# Patient Record
Sex: Male | Born: 1935
Health system: Southern US, Community
[De-identification: ages and names within clinical notes are randomized; demographics above are authoritative.]

## PROBLEM LIST (undated history)

## (undated) DIAGNOSIS — Z973 Presence of spectacles and contact lenses: Secondary | ICD-10-CM

## (undated) DIAGNOSIS — M1990 Unspecified osteoarthritis, unspecified site: Secondary | ICD-10-CM

## (undated) DIAGNOSIS — K219 Gastro-esophageal reflux disease without esophagitis: Secondary | ICD-10-CM

## (undated) DIAGNOSIS — I10 Essential (primary) hypertension: Secondary | ICD-10-CM

## (undated) DIAGNOSIS — E7800 Pure hypercholesterolemia, unspecified: Secondary | ICD-10-CM

## (undated) DIAGNOSIS — D649 Anemia, unspecified: Secondary | ICD-10-CM

## (undated) DIAGNOSIS — Z972 Presence of dental prosthetic device (complete) (partial): Secondary | ICD-10-CM

## (undated) DIAGNOSIS — E119 Type 2 diabetes mellitus without complications: Secondary | ICD-10-CM

## (undated) HISTORY — PX: COLONOSCOPY W/ BIOPSIES AND POLYPECTOMY: SHX1376

## (undated) HISTORY — PX: KNEE ARTHROSCOPY: SHX127

## (undated) HISTORY — PX: TONSILLECTOMY: SUR1361

## (undated) HISTORY — PX: COLONOSCOPY: SHX174

## (undated) HISTORY — PX: EYE SURGERY: SHX253

---

## 1999-12-01 ENCOUNTER — Ambulatory Visit (HOSPITAL_COMMUNITY): Admission: RE | Admit: 1999-12-01 | Discharge: 1999-12-01 | Payer: Self-pay | Admitting: Specialist

## 2000-01-07 ENCOUNTER — Ambulatory Visit (HOSPITAL_COMMUNITY): Admission: RE | Admit: 2000-01-07 | Discharge: 2000-01-07 | Payer: Self-pay | Admitting: Specialist

## 2000-03-05 ENCOUNTER — Encounter: Admission: RE | Admit: 2000-03-05 | Discharge: 2000-03-05 | Payer: Self-pay | Admitting: *Deleted

## 2000-03-05 ENCOUNTER — Encounter: Payer: Self-pay | Admitting: *Deleted

## 2009-05-14 ENCOUNTER — Encounter: Payer: Self-pay | Admitting: Emergency Medicine

## 2009-05-14 ENCOUNTER — Inpatient Hospital Stay (HOSPITAL_COMMUNITY): Admission: EM | Admit: 2009-05-14 | Discharge: 2009-05-15 | Payer: Self-pay | Admitting: Internal Medicine

## 2009-05-14 ENCOUNTER — Ambulatory Visit: Payer: Self-pay | Admitting: Diagnostic Radiology

## 2009-11-07 ENCOUNTER — Encounter: Admission: RE | Admit: 2009-11-07 | Discharge: 2009-11-07 | Payer: Self-pay | Admitting: Internal Medicine

## 2010-06-29 IMAGING — CR DG CHEST 1V PORT
1 series · 1 of 1 positions shown · non-contrast
Comparison: None

CLINICAL DATA: Left-sided chest pain

PORTABLE CHEST - 1 VIEW

[view not recorded]
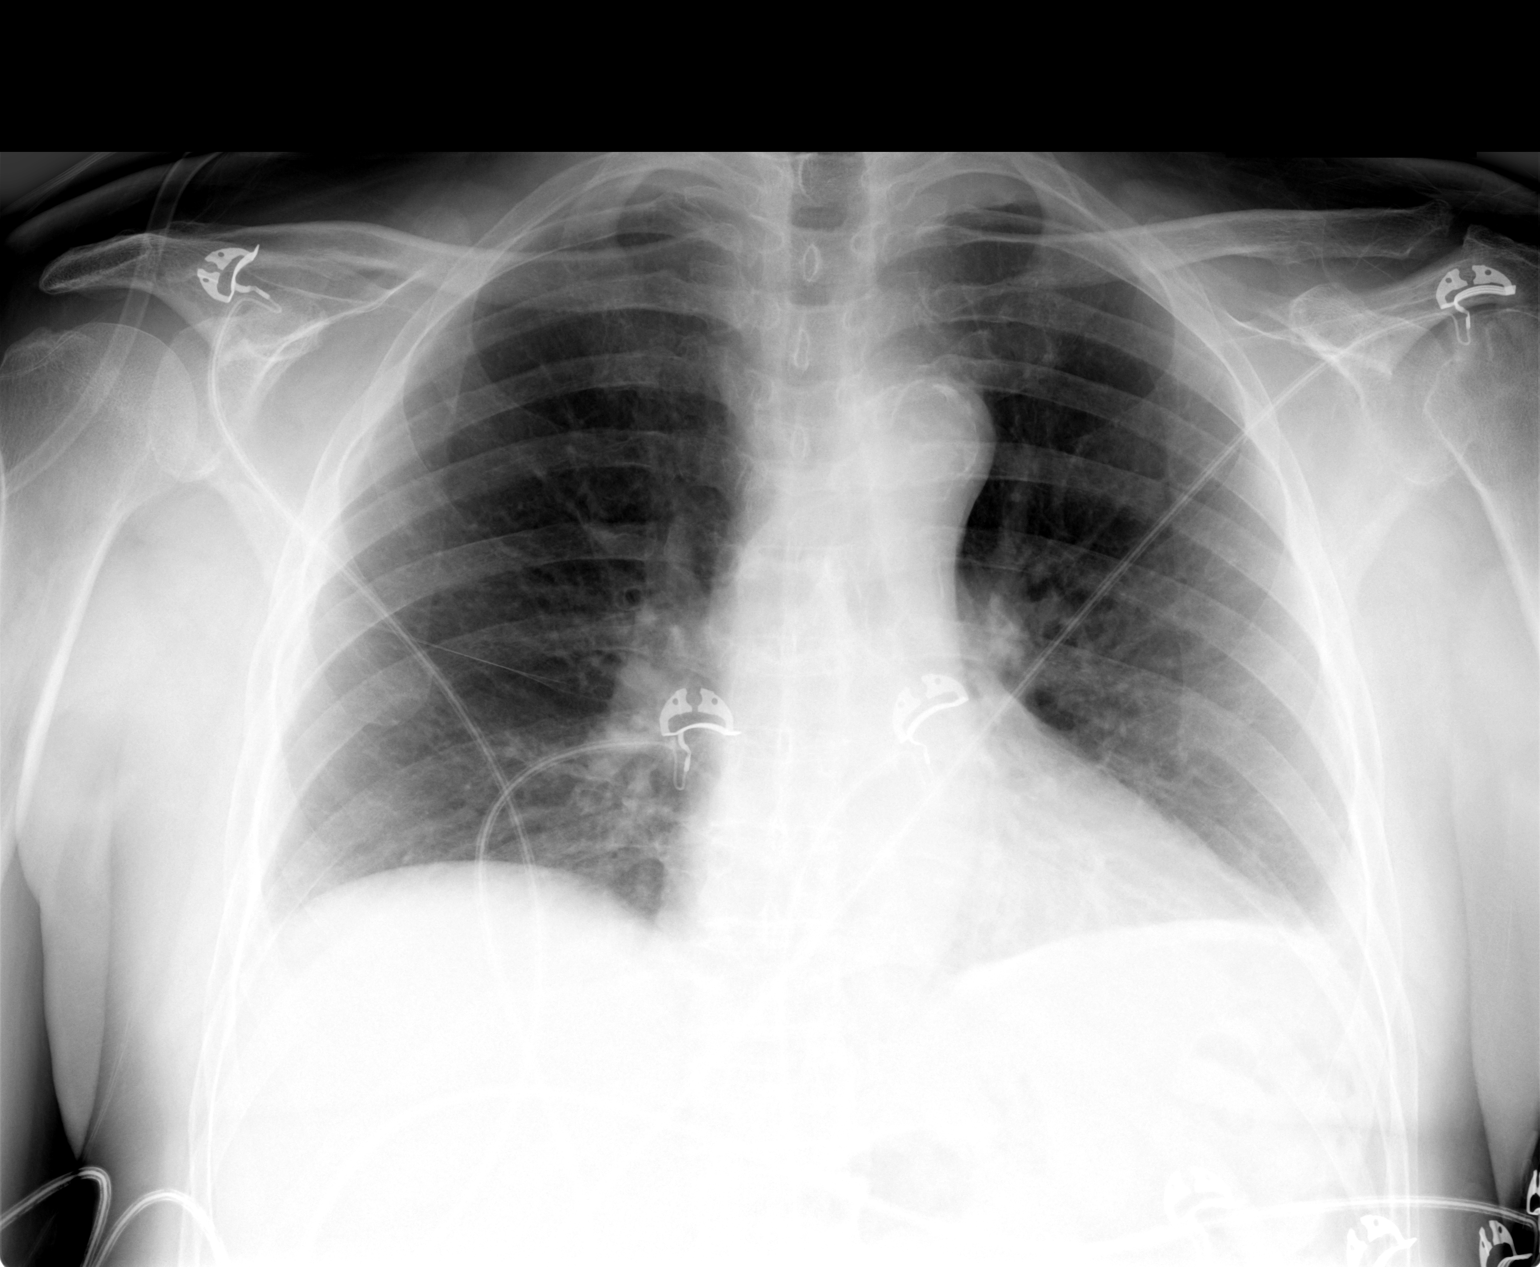

[1 of 1 positions shown; findings below may reference images not displayed]

FINDINGS: Artifact overlies chest.  Heart size is normal.  There is
atherosclerosis of the aorta.  The lungs are clear.  The
vascularity is normal.  There is a poor inspiration.  No
significant bony finding.
IMPRESSION: Poor inspiration.  No active disease.

## 2011-02-15 LAB — CBC
HCT: 39.3 % (ref 39.0–52.0)
Hemoglobin: 13.4 g/dL (ref 13.0–17.0)
MCHC: 34.2 g/dL (ref 30.0–36.0)
MCV: 91 fL (ref 78.0–100.0)
Platelets: 141 10*3/uL — ABNORMAL LOW (ref 150–400)
RBC: 4.32 MIL/uL (ref 4.22–5.81)
RDW: 13.1 % (ref 11.5–15.5)
WBC: 6.5 10*3/uL (ref 4.0–10.5)

## 2011-02-15 LAB — COMPREHENSIVE METABOLIC PANEL
ALT: 12 U/L (ref 0–53)
AST: 21 U/L (ref 0–37)
Albumin: 4 g/dL (ref 3.5–5.2)
Alkaline Phosphatase: 59 U/L (ref 39–117)
BUN: 25 mg/dL — ABNORMAL HIGH (ref 6–23)
CO2: 27 mEq/L (ref 19–32)
Calcium: 9.1 mg/dL (ref 8.4–10.5)
Chloride: 103 mEq/L (ref 96–112)
Creatinine, Ser: 1 mg/dL (ref 0.4–1.5)
GFR calc Af Amer: >60 mL/min (ref >60)
GFR calc non Af Amer: >60 mL/min (ref >60)
Glucose, Bld: 192 mg/dL — ABNORMAL HIGH (ref 70–99)
Potassium: 4.4 mEq/L (ref 3.5–5.1)
Sodium: 137 mEq/L (ref 135–145)
Total Bilirubin: 0.2 mg/dL — ABNORMAL LOW (ref 0.3–1.2)
Total Protein: 6.8 g/dL (ref 6.0–8.3)

## 2011-02-15 LAB — DIFFERENTIAL
Basophils Absolute: 0.1 10*3/uL (ref 0.0–0.1)
Basophils Relative: 1 % (ref 0–1)
Eosinophils Absolute: 0.2 10*3/uL (ref 0.0–0.7)
Eosinophils Relative: 2 % (ref 0–5)
Neutrophils Relative %: 61 % (ref 43–77)

## 2011-02-15 LAB — PROTIME-INR: Prothrombin Time: 14 seconds (ref 11.6–15.2)

## 2011-02-15 LAB — POCT CARDIAC MARKERS: Troponin i, poc: <0.05 ng/mL (ref 0.00–0.09)

## 2011-02-16 LAB — BASIC METABOLIC PANEL
BUN: 22 mg/dL (ref 6–23)
CO2: 26 mEq/L (ref 19–32)
Calcium: 8.7 mg/dL (ref 8.4–10.5)
Chloride: 103 mEq/L (ref 96–112)
Creatinine, Ser: 0.95 mg/dL (ref 0.4–1.5)
GFR calc Af Amer: >60 mL/min (ref >60)
GFR calc non Af Amer: >60 mL/min (ref >60)
Glucose, Bld: 152 mg/dL — ABNORMAL HIGH (ref 70–99)
Potassium: 4.2 mEq/L (ref 3.5–5.1)
Sodium: 135 mEq/L (ref 135–145)

## 2011-02-16 LAB — CARDIAC PANEL(CRET KIN+CKTOT+MB+TROPI)
CK, MB: 1.6 ng/mL (ref 0.3–4.0)
CK, MB: 1.6 ng/mL (ref 0.3–4.0)
Relative Index: INVALID (ref 0.0–2.5)
Relative Index: INVALID (ref 0.0–2.5)
Total CK: 89 U/L (ref 7–232)
Total CK: 99 U/L (ref 7–232)
Troponin I: 0.01 ng/mL (ref 0.00–0.06)
Troponin I: 0.01 ng/mL (ref 0.00–0.06)

## 2011-02-16 LAB — DIFFERENTIAL
Basophils Absolute: 0.1 10*3/uL (ref 0.0–0.1)
Basophils Relative: 1 % (ref 0–1)
Eosinophils Absolute: 0.2 10*3/uL (ref 0.0–0.7)
Eosinophils Relative: 4 % (ref 0–5)
Lymphocytes Relative: 39 % (ref 12–46)
Lymphs Abs: 2.1 10*3/uL (ref 0.7–4.0)
Monocytes Absolute: 0.5 10*3/uL (ref 0.1–1.0)
Monocytes Relative: 9 % (ref 3–12)
Neutro Abs: 2.6 10*3/uL (ref 1.7–7.7)
Neutrophils Relative %: 48 % (ref 43–77)

## 2011-02-16 LAB — CBC
HCT: 39 % (ref 39.0–52.0)
HCT: 40.8 % (ref 39.0–52.0)
Hemoglobin: 13.3 g/dL (ref 13.0–17.0)
Hemoglobin: 14.3 g/dL (ref 13.0–17.0)
MCHC: 34.2 g/dL (ref 30.0–36.0)
MCHC: 34.9 g/dL (ref 30.0–36.0)
MCV: 90.8 fL (ref 78.0–100.0)
MCV: 90.8 fL (ref 78.0–100.0)
Platelets: 130 10*3/uL — ABNORMAL LOW (ref 150–400)
Platelets: 141 10*3/uL — ABNORMAL LOW (ref 150–400)
RBC: 4.29 MIL/uL (ref 4.22–5.81)
RBC: 4.5 MIL/uL (ref 4.22–5.81)
RDW: 13.9 % (ref 11.5–15.5)
RDW: 14.1 % (ref 11.5–15.5)
WBC: 5.5 10*3/uL (ref 4.0–10.5)
WBC: 5.9 10*3/uL (ref 4.0–10.5)

## 2011-02-16 LAB — GLUCOSE, CAPILLARY
Glucose-Capillary: 140 mg/dL — ABNORMAL HIGH (ref 70–99)
Glucose-Capillary: 174 mg/dL — ABNORMAL HIGH (ref 70–99)

## 2011-02-16 LAB — LIPID PANEL
Cholesterol: 125 mg/dL (ref 0–200)
HDL: 30 mg/dL — ABNORMAL LOW (ref >39)
LDL Cholesterol: 57 mg/dL (ref 0–99)
Total CHOL/HDL Ratio: 4.2 RATIO
Triglycerides: 188 mg/dL — ABNORMAL HIGH (ref <150)
VLDL: 38 mg/dL (ref 0–40)

## 2011-02-16 LAB — PHOSPHORUS: Phosphorus: 4 mg/dL (ref 2.3–4.6)

## 2011-02-16 LAB — MAGNESIUM: Magnesium: 2.2 mg/dL (ref 1.5–2.5)

## 2011-03-24 NOTE — H&P (Signed)
NAME:  Andre, Mills NO.:  [id number]   MEDICAL RECORD NO.:  [id number]          PATIENT TYPE:  INP   LOCATION:  3705                         FACILITY:  [facility]   PHYSICIAN:  Andre Mills, M.D.DATE OF BIRTH:  1925/01/15   DATE OF ADMISSION:  05/14/2009  DATE OF DISCHARGE:                              HISTORY & PHYSICAL   PRIMARY CARE PHYSICIAN:  Andre Mills, M.D.   CHIEF COMPLAINT:  Chest pain.   HISTORY OF PRESENT ILLNESS:  Patient is an 77 year old Caucasian male  who initially went to [facility] for chest pain and was  later asked to be transferred here for further evaluation of the chest  pain.  Andre Mills reported that he experienced the sharp chest pain when  waking up from sleep this morning.  The pain lasted for about a second.  It was 8/10 in intensity with no radiation.  The pain was sharp in  nature.  He had multiple episodes of this sharp chest pain, which lasted  for less than a second.  He reports no exacerbating factors or  alleviating factors.  He denies having any shortness of breath,  palpitations, or lightheadedness associated with this chest pain.  He  had no similar episodes in the past.  He never had a stress test done.  He denies having any cough or sputum production.  There is no dyspnea on  exertion.  He denies any PND or orthopnea.  He denies having any flu-  like symptoms in the past 2 weeks.  There is no long distance travel in  the last month.  The pain is not pruritic.  It does not change in  intensity with position.  The pain is not reproducible with palpation.   Andre Mills reported that he had a cardiac catheterization done several  years ago, but no records are available to confirm this.   REVIEW OF SYSTEMS:  The complete review of systems was done, which  included general, HEENT, cardiovascular, respiratory, GI, GU, endocrine,  skin, musculoskeletal, neurologic, psychiatric, all within normal limits  other than what is mentioned in the history of present illness.   PAST MEDICAL HISTORY:  1. Diabetes mellitus type 2.  2. Hypertension.  3. Gastroesophageal reflux disease.  4. Hyperlipidemia.   ALLERGIES:  1. MACRODANTIN.  2. LIMBITROL.  3. SULFA DRUGS.  4. FLEXERIL.  5. NAPROSYN.   CURRENT MEDICATIONS AT HOME:  1. Actos 45 mg once daily.  2. Glimepiride 2 mg 1-1/2 tablets once daily.  3. Prevacid once daily.  4. Lisinopril/HCTZ 20/25 mg once daily.  5. Simvastatin 80 mg p.o. q.h.s.  6. Aspirin 81 mg once daily.   SOCIAL HISTORY:  There is no history of tobacco, alcohol, or illicit  drug use.   FAMILY HISTORY:  Noncontributory.   PHYSICAL EXAMINATION:  At the time of presentation at the outside  hospital, blood pressure was 185/61, pulse rate 72, respirations 18,  temperature 98.3.  His O2 sats were 100% on room air.  CURRENT VITALS:  Temperature 98.4, pulse rate 60, respirations 19, blood  pressure 162/74.  O2 sats 96% on room air.  GENERAL APPEARANCE:  Not in any acute distress.  Awake, alert and  oriented x3.  Afebrile.  HEENT:  Normocephalic and atraumatic.  Pupils are equal and reactive to  light and accommodation.  Extraocular muscles are intact.  Mucous  membranes are moist.  NECK:  Supple.  No JVD, lymphadenopathy, or carotid bruits.  CVS:  Regular rate and rhythm normal.  No murmurs, rubs or gallops.  LUNGS:  Clear to auscultation bilaterally.  ABDOMEN:  Soft, nontender, nondistended.  No hepatosplenomegaly or  palpable masses.  EXTREMITIES:  No clubbing, cyanosis or edema.  NEUROLOGIC:  Grossly nonfocal.   LABS/STUDIES OBTAINED AT OUTSIDE HOSPITAL:  CK 59.  CK-MB less than 1.  Troponin less than 0.05.  D-dimer less than 0.22.  Comprehensive  metabolic panel:  Sodium 137, potassium 4.4, chloride 103, bicarb 27,  BUN 25, creatinine 1, blood glucose 192.  Total protein 6.8.  Albumin 4.  ALT 12, AST 21, alkaline phosphatase 59, total bilirubin 0.2.  PTT 34   seconds.  INR 1.1.  PT 14.   Portable chest x-ray, 1 view:  Poor inspiration.  No active disease.   CBC with differential:  WBC 6500, hemoglobin 13.4, hematocrit 39,  platelets 141,000.  Normal differential.   ASSESSMENT/PLAN:  1. Chest pain, very atypical for cardiac ischemia, but the patient has      risk factors for hypertension, diabetes, hyperlipidemia.  He takes      an aspirin every day.  He has nonspecific EKG changes.  He has a      TIMI score of at least 3.  Will work up for cardiac ischemia with      an exercise stress test.  We will start him on acute coronary      syndrome protocol.  Will continue to cycle cardiac enzymes.  Will      repeat an EKG in the morning.  The D-dimer was negative, less      likely this is a pulmonary embolus.  Will consider differentials      like pericarditis and gastroesophageal reflux disease as well.  He      will have sublingual nitroglycerin as needed for chest pain.  He is      currently chest painfree.  2. Hypertension:  Not at goal.  We will start him on Lasix protocol      with lisinopril and metoprolol.  Will adjust the medications as      needed.  3. Diabetes mellitus type 2:  Andre Mills reports that his hemoglobin      A1C improved from 8.4 to 6.5 with diet and exercise and his current      home medications.  He will be made n.p.o. for the stress test, so      we will hold off on all of the other medications.  We will do Accu-      Cheks q.a.c., t.i.d., and q.h.s. or q.6h. if he is n.p.o.  We will      add sliding scale based on his Accu-Chek values.  4. Hyperlipidemia:  Will check a fasting lipid panel and continue the      statin.  5. Gastroesophageal reflux disease:  Currently asymptomatic.  If the      workup for the chest pain is negative, the stress test, we will      consider doing a trial of PPI for 2 weeks.  He had a history of an  EGD done in the past, the report of which is not available.  6. Deep venous thrombosis  prophylaxis with Lovenox.  7. Fluids, electrolytes, nutrition:  We will rebuild the electrolytes      as needed.  He will be made n.p.o. until the stress test is done.   DISPOSITION:  Will admit him to the cardiac floor with telemetry.      Andre Mills, M.D.  Electronically Signed     Andre Mills, M.D.  Electronically Signed    SKK/MEDQ  D:  05/15/2009  T:  05/15/2009  Job:  [id number]

## 2011-03-24 NOTE — Discharge Summary (Signed)
NAME:  Andre Mills, Andre Mills NO.:  [id number]   MEDICAL RECORD NO.:  [id number]          PATIENT TYPE:  INP   LOCATION:  3705                         FACILITY:  [facility]   PHYSICIAN:  Theressa Millard, M.D.    DATE OF BIRTH:  1925/07/12   DATE OF ADMISSION:  05/14/2009  DATE OF DISCHARGE:  05/15/2009                               DISCHARGE SUMMARY   ADMITTING DIAGNOSIS:  Chest pain.   DISCHARGE DIAGNOSES:  1. Atypical chest pain.  2. Recent episodes of dizziness, possibly hypoglycemia, possibly      vertigo.  3. Fatigue.  4. Diabetes mellitus.  5. Hypertension.  6. Hypercholesterolemia.   The patient is an 77 year old white male who is very active, exercising  regularly at the gym.  On the day of admission, he had several episodes  of a sharp rather severe pain that was sticking sensation in the left  chest.  Each episode of pain lasted approximately 1 second.  He came to  the [facility] Emergency Department and was then transferred to [facility] for admission.   HOSPITAL COURSE:  The patient was admitted and on serial enzymes and  EKGs, had no evidence of cardiac ischemia.  It is my opinion that this  pain is very atypical for coronary symptoms and with the patient able to  exercise vigorously as late as yesterday, I did not think that a stress  test was indicated.  Therefore, the patient is discharged in improved  condition.   In regard to dizziness, he describes episodes of spinning sensation when  he bends over and gets up, but he also describes episodes of similar  symptoms when he is sitting without movement of the head and sometimes  symptoms that are similar to this are improved with eating.  Therefore,  we will lower his glimepiride from one and a half tablets to one tablet  daily.  He has recently lost weight.   In regard to fatigue, extensive laboratory data at admission are normal.   He is discharged in improved condition.   DISCHARGE  MEDICATIONS:  1. Actos 45 mg daily.  2. Glimepiride 2 mg 1 daily (this is a change in dose).  3. Lisinopril/hydrochlorothiazide 20/12.5 once daily.  4. Omeprazole 20 mg daily.  5. Aspirin 81 mg daily.  6. Simvastatin 80 mg daily.   ACTIVITY:  No restrictions.   DIET:  No added salt.   FOLLOWUP:  He will call to make an appointment to see me in 2 weeks.      Theressa Millard, M.D.  Electronically Signed     JO/MEDQ  D:  05/15/2009  T:  05/15/2009  Job:  [id number]

## 2011-03-27 NOTE — Op Note (Signed)
[place]  Patient:    Andre Mills, Andre Mills                       MRN: [id number] Proc. Date: 01/07/00 Adm. Date:  [id number] Disc. Date: [id number] Attending:  Mick Sell                           Operative Report  PREOPERATIVE DIAGNOSIS:  Cataract, right eye.  POSTOPERATIVE DIAGNOSIS:  Cataract, right eye.  OPERATION PERFORMED:  Cataract extraction with intraocular lens implant, right eye.  SURGEON:  Chucky May, M.D.  INDICATIONS FOR SURGERY:  The patient is a 77 year old male with painless progressive decrease in vision so that he has difficulty seeing for reading and driving. On examination the patient was found to have a dense nuclear sclerotic and cortical cataract consistent with the decrease in visual acuity.  DESCRIPTION OF PROCEDURE:  The patient was brought to the main operating room and placed in supine position.  Anesthesia was obtained by means of topical 4% lidocaine drops with tetracaine.  The patient was then prepped and draped in the usual manner.  A lid speculum was inserted and the cornea was entered with a diamond keratome superiorly with an additional port superior temporally. OcuCoat was instilled and an anterior capsulorrhexis was performed without difficulty.  The nucleus was mobilized by hydrodissection followed by phacoemulsification of the nucleus and removal of residual cortical material by irrigation and aspiration.  The posterior capsule was polished and a posterior chamber lens implant was placed in the bag without difficulty. OcuCoat was removed and replaced with balanced salt solution.  The wound was hydrated with balanced salt solution and checked for fluid leaks but none were noted.  The eye was dressed with topical Pred Forte, Ocuflox, Voltaren and a Fox shield and the patient was taken to the recovery room in excellent condition where he received written and verbal instructions for his postoperative  care and was scheduled for follow up in 24 hours. DD:  02/11/00 TD:  02/11/00 Job: [id number]

## 2011-07-28 ENCOUNTER — Other Ambulatory Visit: Payer: Self-pay | Admitting: Internal Medicine

## 2011-07-28 ENCOUNTER — Ambulatory Visit
Admission: RE | Admit: 2011-07-28 | Discharge: 2011-07-28 | Disposition: A | Discharge status: Home / Self Care | Payer: Medicare Other | Source: Ambulatory Visit | Attending: Internal Medicine | Admitting: Internal Medicine

## 2011-07-28 DIAGNOSIS — R1031 Right lower quadrant pain: Secondary | ICD-10-CM

## 2011-07-28 MED ORDER — IOHEXOL 300 MG/ML  SOLN
100.0000 mL | Freq: Once | INTRAMUSCULAR | Status: AC | PRN
  Administered 2011-07-28: 100 mL via INTRAVENOUS

## 2011-11-26 DIAGNOSIS — H409 Unspecified glaucoma: Secondary | ICD-10-CM | POA: Diagnosis not present

## 2011-11-26 DIAGNOSIS — H4011X0 Primary open-angle glaucoma, stage unspecified: Secondary | ICD-10-CM | POA: Diagnosis not present

## 2011-12-08 DIAGNOSIS — M13179 Monoarthritis, not elsewhere classified, unspecified ankle and foot: Secondary | ICD-10-CM | POA: Diagnosis not present

## 2012-01-21 DIAGNOSIS — L259 Unspecified contact dermatitis, unspecified cause: Secondary | ICD-10-CM | POA: Diagnosis not present

## 2012-01-21 DIAGNOSIS — C44519 Basal cell carcinoma of skin of other part of trunk: Secondary | ICD-10-CM | POA: Diagnosis not present

## 2012-02-11 DIAGNOSIS — E11319 Type 2 diabetes mellitus with unspecified diabetic retinopathy without macular edema: Secondary | ICD-10-CM | POA: Diagnosis not present

## 2012-02-11 DIAGNOSIS — E1139 Type 2 diabetes mellitus with other diabetic ophthalmic complication: Secondary | ICD-10-CM | POA: Diagnosis not present

## 2012-02-11 DIAGNOSIS — I10 Essential (primary) hypertension: Secondary | ICD-10-CM | POA: Diagnosis not present

## 2012-02-25 DIAGNOSIS — N401 Enlarged prostate with lower urinary tract symptoms: Secondary | ICD-10-CM | POA: Diagnosis not present

## 2012-02-25 DIAGNOSIS — R1031 Right lower quadrant pain: Secondary | ICD-10-CM | POA: Diagnosis not present

## 2012-02-25 DIAGNOSIS — N402 Nodular prostate without lower urinary tract symptoms: Secondary | ICD-10-CM | POA: Diagnosis not present

## 2012-03-09 DIAGNOSIS — G609 Hereditary and idiopathic neuropathy, unspecified: Secondary | ICD-10-CM | POA: Diagnosis not present

## 2012-03-10 ENCOUNTER — Other Ambulatory Visit: Payer: Self-pay | Admitting: *Deleted

## 2012-03-10 DIAGNOSIS — M549 Dorsalgia, unspecified: Secondary | ICD-10-CM

## 2012-03-15 ENCOUNTER — Ambulatory Visit
Admission: RE | Admit: 2012-03-15 | Discharge: 2012-03-15 | Disposition: A | Discharge status: Home / Self Care | Payer: Medicare Other | Source: Ambulatory Visit | Attending: *Deleted | Admitting: *Deleted

## 2012-03-15 DIAGNOSIS — M549 Dorsalgia, unspecified: Secondary | ICD-10-CM | POA: Diagnosis not present

## 2012-03-15 DIAGNOSIS — R197 Diarrhea, unspecified: Secondary | ICD-10-CM | POA: Diagnosis not present

## 2012-03-15 DIAGNOSIS — R112 Nausea with vomiting, unspecified: Secondary | ICD-10-CM | POA: Diagnosis not present

## 2012-03-16 DIAGNOSIS — K7689 Other specified diseases of liver: Secondary | ICD-10-CM | POA: Diagnosis not present

## 2012-04-18 DIAGNOSIS — J069 Acute upper respiratory infection, unspecified: Secondary | ICD-10-CM | POA: Diagnosis not present

## 2012-05-18 DIAGNOSIS — L0100 Impetigo, unspecified: Secondary | ICD-10-CM | POA: Diagnosis not present

## 2012-05-18 DIAGNOSIS — L259 Unspecified contact dermatitis, unspecified cause: Secondary | ICD-10-CM | POA: Diagnosis not present

## 2012-05-26 DIAGNOSIS — L930 Discoid lupus erythematosus: Secondary | ICD-10-CM | POA: Diagnosis not present

## 2012-05-26 DIAGNOSIS — E11329 Type 2 diabetes mellitus with mild nonproliferative diabetic retinopathy without macular edema: Secondary | ICD-10-CM | POA: Diagnosis not present

## 2012-05-26 DIAGNOSIS — H4011X0 Primary open-angle glaucoma, stage unspecified: Secondary | ICD-10-CM | POA: Diagnosis not present

## 2012-05-26 DIAGNOSIS — L738 Other specified follicular disorders: Secondary | ICD-10-CM | POA: Diagnosis not present

## 2012-05-26 DIAGNOSIS — E1139 Type 2 diabetes mellitus with other diabetic ophthalmic complication: Secondary | ICD-10-CM | POA: Diagnosis not present

## 2012-06-01 DIAGNOSIS — E1139 Type 2 diabetes mellitus with other diabetic ophthalmic complication: Secondary | ICD-10-CM | POA: Diagnosis not present

## 2012-06-01 DIAGNOSIS — E11319 Type 2 diabetes mellitus with unspecified diabetic retinopathy without macular edema: Secondary | ICD-10-CM | POA: Diagnosis not present

## 2012-06-01 DIAGNOSIS — I10 Essential (primary) hypertension: Secondary | ICD-10-CM | POA: Diagnosis not present

## 2012-06-02 DIAGNOSIS — B359 Dermatophytosis, unspecified: Secondary | ICD-10-CM | POA: Diagnosis not present

## 2012-08-01 DIAGNOSIS — B356 Tinea cruris: Secondary | ICD-10-CM | POA: Diagnosis not present

## 2012-08-01 DIAGNOSIS — L2089 Other atopic dermatitis: Secondary | ICD-10-CM | POA: Diagnosis not present

## 2012-08-01 DIAGNOSIS — L821 Other seborrheic keratosis: Secondary | ICD-10-CM | POA: Diagnosis not present

## 2012-08-01 DIAGNOSIS — L738 Other specified follicular disorders: Secondary | ICD-10-CM | POA: Diagnosis not present

## 2012-08-01 DIAGNOSIS — Z85828 Personal history of other malignant neoplasm of skin: Secondary | ICD-10-CM | POA: Diagnosis not present

## 2012-08-22 DIAGNOSIS — E119 Type 2 diabetes mellitus without complications: Secondary | ICD-10-CM | POA: Diagnosis not present

## 2012-08-22 DIAGNOSIS — I10 Essential (primary) hypertension: Secondary | ICD-10-CM | POA: Diagnosis not present

## 2012-08-22 DIAGNOSIS — IMO0002 Reserved for concepts with insufficient information to code with codable children: Secondary | ICD-10-CM | POA: Diagnosis not present

## 2012-08-22 DIAGNOSIS — Z23 Encounter for immunization: Secondary | ICD-10-CM | POA: Diagnosis not present

## 2012-10-03 DIAGNOSIS — B354 Tinea corporis: Secondary | ICD-10-CM | POA: Diagnosis not present

## 2012-10-03 DIAGNOSIS — Z85828 Personal history of other malignant neoplasm of skin: Secondary | ICD-10-CM | POA: Diagnosis not present

## 2012-11-15 DIAGNOSIS — R05 Cough: Secondary | ICD-10-CM | POA: Diagnosis not present

## 2012-11-23 DIAGNOSIS — E1139 Type 2 diabetes mellitus with other diabetic ophthalmic complication: Secondary | ICD-10-CM | POA: Diagnosis not present

## 2012-11-23 DIAGNOSIS — I10 Essential (primary) hypertension: Secondary | ICD-10-CM | POA: Diagnosis not present

## 2012-11-23 DIAGNOSIS — E11319 Type 2 diabetes mellitus with unspecified diabetic retinopathy without macular edema: Secondary | ICD-10-CM | POA: Diagnosis not present

## 2012-11-23 DIAGNOSIS — R42 Dizziness and giddiness: Secondary | ICD-10-CM | POA: Diagnosis not present

## 2012-12-01 DIAGNOSIS — H409 Unspecified glaucoma: Secondary | ICD-10-CM | POA: Diagnosis not present

## 2012-12-01 DIAGNOSIS — H4011X0 Primary open-angle glaucoma, stage unspecified: Secondary | ICD-10-CM | POA: Diagnosis not present

## 2013-01-20 ENCOUNTER — Emergency Department (HOSPITAL_COMMUNITY): Payer: Medicare Other

## 2013-01-20 ENCOUNTER — Encounter (HOSPITAL_COMMUNITY): Payer: Self-pay | Admitting: *Deleted

## 2013-01-20 ENCOUNTER — Emergency Department (HOSPITAL_COMMUNITY)
Admission: EM | Admit: 2013-01-20 | Discharge: 2013-01-20 | Disposition: A | Discharge status: Home / Self Care | Payer: Medicare Other | Attending: Emergency Medicine | Admitting: Emergency Medicine

## 2013-01-20 DIAGNOSIS — R279 Unspecified lack of coordination: Secondary | ICD-10-CM | POA: Diagnosis not present

## 2013-01-20 DIAGNOSIS — H538 Other visual disturbances: Secondary | ICD-10-CM | POA: Diagnosis not present

## 2013-01-20 DIAGNOSIS — Z7982 Long term (current) use of aspirin: Secondary | ICD-10-CM | POA: Insufficient documentation

## 2013-01-20 DIAGNOSIS — E7800 Pure hypercholesterolemia, unspecified: Secondary | ICD-10-CM | POA: Diagnosis not present

## 2013-01-20 DIAGNOSIS — I10 Essential (primary) hypertension: Secondary | ICD-10-CM | POA: Diagnosis not present

## 2013-01-20 DIAGNOSIS — E119 Type 2 diabetes mellitus without complications: Secondary | ICD-10-CM | POA: Insufficient documentation

## 2013-01-20 DIAGNOSIS — R110 Nausea: Secondary | ICD-10-CM | POA: Diagnosis not present

## 2013-01-20 DIAGNOSIS — Z79899 Other long term (current) drug therapy: Secondary | ICD-10-CM | POA: Insufficient documentation

## 2013-01-20 DIAGNOSIS — R51 Headache: Secondary | ICD-10-CM | POA: Diagnosis not present

## 2013-01-20 DIAGNOSIS — I658 Occlusion and stenosis of other precerebral arteries: Secondary | ICD-10-CM | POA: Diagnosis not present

## 2013-01-20 DIAGNOSIS — M542 Cervicalgia: Secondary | ICD-10-CM | POA: Insufficient documentation

## 2013-01-20 HISTORY — DX: Pure hypercholesterolemia, unspecified: E78.00

## 2013-01-20 HISTORY — DX: Essential (primary) hypertension: I10

## 2013-01-20 HISTORY — DX: Type 2 diabetes mellitus without complications: E11.9

## 2013-01-20 LAB — POCT I-STAT, CHEM 8
BUN: 34 mg/dL — ABNORMAL HIGH (ref 6–23)
Chloride: 107 mEq/L (ref 96–112)
Creatinine, Ser: 0.8 mg/dL (ref 0.50–1.35)
Potassium: 5.2 mEq/L — ABNORMAL HIGH (ref 3.5–5.1)
Sodium: 139 mEq/L (ref 135–145)
TCO2: 26 mmol/L (ref 0–100)

## 2013-01-20 MED ORDER — GADOBENATE DIMEGLUMINE 529 MG/ML IV SOLN
15.0000 mL | Freq: Once | INTRAVENOUS | Status: AC | PRN
  Administered 2013-01-20: 15 mL via INTRAVENOUS

## 2013-01-20 NOTE — ED Notes (Signed)
Pt reports neck pain yesterday, woke up this am with severe headache, difficulty focusing vision and nausea.

## 2013-01-20 NOTE — ED Provider Notes (Addendum)
History     CSN: [id number]  Arrival date & time 01/20/13  1110   First MD Initiated Contact with Patient 01/20/13 1215      Chief Complaint  Patient presents with  . Headache  . Nausea    (Consider location/radiation/quality/duration/timing/severity/associated sxs/prior treatment) HPI Comments:  Patient presents with headache. He states that over the last 2-3 days he's had some pain along the right side of his neck. He states the neck pain was gone today however about 9:30 this morning he had a sudden onset of intense right frontal headache. He was just walking when it happened. He states that since yesterday's had some intermittent blurry vision. He currently denies any blurry vision but he does say when the headache started he was having some problems with his balance and felt wobbly. He had some nausea but no vomiting. He denies any numbness or weakness in his extremities. He denies any speech problems. He denies any recent head trauma. He denies being on any anticoagulants. He denies a history of headaches in the past.  Patient is a 79 y.o. male presenting with headaches.  Headache Associated symptoms: neck pain   Associated symptoms: no abdominal pain, no back pain, no congestion, no cough, no diarrhea, no dizziness, no fatigue, no fever, no nausea, no numbness and no vomiting     Past Medical History  Diagnosis Date  . High cholesterol   . Hypertension   . Diabetes mellitus without complication     History reviewed. No pertinent past surgical history.  History reviewed. No pertinent family history.  History  Substance Use Topics  . Smoking status: Not on file  . Smokeless tobacco: Not on file  . Alcohol Use: No      Review of Systems  Constitutional: Negative for fever, chills, diaphoresis and fatigue.  HENT: Positive for neck pain. Negative for congestion, rhinorrhea and sneezing.   Eyes: Positive for visual disturbance.  Respiratory: Negative for cough, chest  tightness and shortness of breath.   Cardiovascular: Negative for chest pain and leg swelling.  Gastrointestinal: Negative for nausea, vomiting, abdominal pain, diarrhea and blood in stool.  Genitourinary: Negative for frequency, hematuria, flank pain and difficulty urinating.  Musculoskeletal: Negative for back pain and arthralgias.  Skin: Negative for rash.  Neurological: Positive for headaches. Negative for dizziness, speech difficulty, weakness and numbness.    Allergies  Flexeril; Limbitrol ds; Macrodantin; Mobic; and Septra  Home Medications   Current Outpatient Rx  Name  Route  Sig  Dispense  Refill  . ammonium lactate (LAC-HYDRIN) 12 % lotion   Topical   Apply 1 application topically as needed for dry skin (to stomach).         Marland Kitchen aspirin EC 81 MG tablet   Oral   Take 81 mg by mouth daily.         Marland Kitchen gabapentin (NEURONTIN) 300 MG capsule   Oral   Take 300 mg by mouth 2 (two) times daily.         Marland Kitchen glimepiride (AMARYL) 1 MG tablet   Oral   Take 0.5 mg by mouth daily before breakfast.         . ibuprofen (ADVIL,MOTRIN) 200 MG tablet   Oral   Take 400 mg by mouth every 6 (six) hours as needed for pain.         Marland Kitchen lisinopril-hydrochlorothiazide (PRINZIDE,ZESTORETIC) 20-25 MG per tablet   Oral   Take 1 tablet by mouth daily.         Marland Kitchen  metFORMIN (GLUCOPHAGE) 500 MG tablet   Oral   Take 500 mg by mouth 2 (two) times daily with a meal.         . omeprazole (PRILOSEC) 20 MG capsule   Oral   Take 20 mg by mouth daily.         . pioglitazone (ACTOS) 45 MG tablet   Oral   Take 45 mg by mouth daily.         Marland Kitchen PRESCRIPTION MEDICATION   Both Eyes   Place 1 drop into both eyes 2 (two) times daily. Eye drops for glaucoma         . simvastatin (ZOCOR) 80 MG tablet   Oral   Take 40 mg by mouth at bedtime.           BP 159/63  Pulse 66  Temp(Src) 97.9 F (36.6 C) (Oral)  Resp 18  SpO2 100%  Physical Exam  Constitutional: He is oriented to  person, place, and time. He appears well-developed and well-nourished.  HENT:  Head: Normocephalic and atraumatic.  Eyes: Pupils are equal, round, and reactive to light.  Neck: Normal range of motion. Neck supple.  Cardiovascular: Normal rate, regular rhythm and normal heart sounds.   Pulmonary/Chest: Effort normal and breath sounds normal. No respiratory distress. He has no wheezes. He has no rales. He exhibits no tenderness.  Abdominal: Soft. Bowel sounds are normal. There is no tenderness. There is no rebound and no guarding.  Musculoskeletal: Normal range of motion. He exhibits no edema.  Lymphadenopathy:    He has no cervical adenopathy.  Neurological: He is alert and oriented to person, place, and time. He has normal strength. No cranial nerve deficit or sensory deficit. GCS eye subscore is 4. GCS verbal subscore is 5. GCS motor subscore is 6.  FTN intact  Skin: Skin is warm and dry. No rash noted.  Psychiatric: He has a normal mood and affect.    ED Course  Procedures (including critical care time)  Results for orders placed during the hospital encounter of 01/20/13  POCT I-STAT, CHEM 8      Result Value Range   Sodium 139  135 - 145 mEq/L   Potassium 5.2 (*) 3.5 - 5.1 mEq/L   Chloride 107  96 - 112 mEq/L   BUN 34 (*) 6 - 23 mg/dL   Creatinine, Ser 1.61  0.50 - 1.35 mg/dL   Glucose, Bld 096 (*) 70 - 99 mg/dL   Calcium, Ion 0.45  4.09 - 1.30 mmol/L   TCO2 26  0 - 100 mmol/L   Hemoglobin 12.2 (*) 13.0 - 17.0 g/dL   HCT 81.1 (*) 91.4 - 78.2 %   Ct Head Wo Contrast  01/20/2013  *RADIOLOGY REPORT*  Clinical Data: Headache  CT HEAD WITHOUT CONTRAST  Technique:  Contiguous axial images were obtained from the base of the skull through the vertex without contrast.  Comparison: None.  Findings: Ventricle size is normal.  Mild patchy hypodensity in the frontal white matter bilaterally with the appearance of chronic microvascular ischemia.  Negative for acute infarct.  Negative for  hemorrhage or mass lesion.  Skull is negative.  IMPRESSION: Mild chronic microvascular ischemia.  No acute abnormality.   Original Report Authenticated By: Janeece Riggers, M.D.    Mr [facility] Wo Contrast  01/20/2013  *RADIOLOGY REPORT*  Clinical Data:  Headache and nausea.  Neck pain.  Blurred vision  MRI HEAD WITHOUT AND WITH CONTRAST MRA HEAD WITHOUT CONTRAST MRA  NECK WITHOUT AND WITH CONTRAST  Technique:  Multiplanar, multiecho pulse sequences of the brain and surrounding structures were obtained without and with intravenous contrast.  Angiographic images of the Circle of Willis were obtained using MRA technique without intravenous contrast. Angiographic images of the neck were obtained using MRA technique without and with intravenous contrast.  Carotid stenosis measurements (when applicable) are obtained utilizing NASCET criteria, using the distal internal carotid diameter as the denominator.  Contrast: 15mL MULTIHANCE GADOBENATE DIMEGLUMINE 529 MG/ML IV SOLN  Comparison:  CT head 01/20/2013  MRI HEAD  Findings:  Negative for acute infarct.  Scattered small white matter hyperintensities bilaterally consistent with chronic microvascular ischemia.  Brainstem is intact.  Tiny chronic infarct right cerebellum.  Negative for intracranial hemorrhage.  No mass or edema is present. There is no midline shift.  Normal enhancement following contrast infusion.  Mild mucosal edema in the paranasal sinuses bilaterally without air- fluid level.  IMPRESSION: Mild chronic microvascular ischemic changes in the white matter, typical for age.  No acute infarct or mass.  MRA HEAD  Findings: Right vertebral artery is dominant and widely patent to the basilar.  Left vertebral artery is hypoplastic distally with minimal contribution to the basilar.  The basilar is widely patent. Superior cerebellar and posterior cerebral arteries are patent. Fetal origin of the right posterior cerebral artery with hypoplastic right P1 segment.   Cavernous carotid is patent bilaterally without stenosis.  Anterior and middle cerebral arteries are patent without significant stenosis.  Negative for cerebral aneurysm.  IMPRESSION: No significant intracranial stenosis.  Negative for aneurysm.  MRA NECK  Findings: Proximal great vessels are patent bilaterally.  There is a mild stenosis of the proximal left subclavian artery.  Carotid artery is patent bilaterally.  There is atherosclerotic plaque involving the proximal internal carotid artery bilaterally, left greater than right without significant stenosis.  External carotid artery is patent bilaterally.  Right vertebral artery is dominant and patent to the basilar. There is a mild stenosis at the origin of the vertebral artery bilaterally.  The distal left vertebral artery is hypoplastic.  IMPRESSION: Mild stenosis proximal left subclavian artery.   Mild stenosis at the origin of the vertebral artery bilaterally.  Mild atherosclerotic plaque at the proximal internal carotid artery bilaterally without significant carotid stenosis.   Original Report Authenticated By: Janeece Riggers, M.D.    Mr Angiogram Neck W Wo Contrast  01/20/2013  *RADIOLOGY REPORT*  Clinical Data:  Headache and nausea.  Neck pain.  Blurred vision  MRI HEAD WITHOUT AND WITH CONTRAST MRA HEAD WITHOUT CONTRAST MRA NECK WITHOUT AND WITH CONTRAST  Technique:  Multiplanar, multiecho pulse sequences of the brain and surrounding structures were obtained without and with intravenous contrast.  Angiographic images of the Circle of Willis were obtained using MRA technique without intravenous contrast. Angiographic images of the neck were obtained using MRA technique without and with intravenous contrast.  Carotid stenosis measurements (when applicable) are obtained utilizing NASCET criteria, using the distal internal carotid diameter as the denominator.  Contrast: 15mL MULTIHANCE GADOBENATE DIMEGLUMINE 529 MG/ML IV SOLN  Comparison:  CT head 01/20/2013   MRI HEAD  Findings:  Negative for acute infarct.  Scattered small white matter hyperintensities bilaterally consistent with chronic microvascular ischemia.  Brainstem is intact.  Tiny chronic infarct right cerebellum.  Negative for intracranial hemorrhage.  No mass or edema is present. There is no midline shift.  Normal enhancement following contrast infusion.  Mild mucosal edema in the paranasal sinuses bilaterally without air-  fluid level.  IMPRESSION: Mild chronic microvascular ischemic changes in the white matter, typical for age.  No acute infarct or mass.  MRA HEAD  Findings: Right vertebral artery is dominant and widely patent to the basilar.  Left vertebral artery is hypoplastic distally with minimal contribution to the basilar.  The basilar is widely patent. Superior cerebellar and posterior cerebral arteries are patent. Fetal origin of the right posterior cerebral artery with hypoplastic right P1 segment.  Cavernous carotid is patent bilaterally without stenosis.  Anterior and middle cerebral arteries are patent without significant stenosis.  Negative for cerebral aneurysm.  IMPRESSION: No significant intracranial stenosis.  Negative for aneurysm.  MRA NECK  Findings: Proximal great vessels are patent bilaterally.  There is a mild stenosis of the proximal left subclavian artery.  Carotid artery is patent bilaterally.  There is atherosclerotic plaque involving the proximal internal carotid artery bilaterally, left greater than right without significant stenosis.  External carotid artery is patent bilaterally.  Right vertebral artery is dominant and patent to the basilar. There is a mild stenosis at the origin of the vertebral artery bilaterally.  The distal left vertebral artery is hypoplastic.  IMPRESSION: Mild stenosis proximal left subclavian artery.   Mild stenosis at the origin of the vertebral artery bilaterally.  Mild atherosclerotic plaque at the proximal internal carotid artery bilaterally without  significant carotid stenosis.   Original Report Authenticated By: Janeece Riggers, M.D.    Mr Laqu[facility] Wo Contrast  01/20/2013  *RADIOLOGY REPORT*  Clinical Data:  Headache and nausea.  Neck pain.  Blurred vision  MRI HEAD WITHOUT AND WITH CONTRAST MRA HEAD WITHOUT CONTRAST MRA NECK WITHOUT AND WITH CONTRAST  Technique:  Multiplanar, multiecho pulse sequences of the brain and surrounding structures were obtained without and with intravenous contrast.  Angiographic images of the Circle of Willis were obtained using MRA technique without intravenous contrast. Angiographic images of the neck were obtained using MRA technique without and with intravenous contrast.  Carotid stenosis measurements (when applicable) are obtained utilizing NASCET criteria, using the distal internal carotid diameter as the denominator.  Contrast: 15mL MULTIHANCE GADOBENATE DIMEGLUMINE 529 MG/ML IV SOLN  Comparison:  CT head 01/20/2013  MRI HEAD  Findings:  Negative for acute infarct.  Scattered small white matter hyperintensities bilaterally consistent with chronic microvascular ischemia.  Brainstem is intact.  Tiny chronic infarct right cerebellum.  Negative for intracranial hemorrhage.  No mass or edema is present. There is no midline shift.  Normal enhancement following contrast infusion.  Mild mucosal edema in the paranasal sinuses bilaterally without air- fluid level.  IMPRESSION: Mild chronic microvascular ischemic changes in the white matter, typical for age.  No acute infarct or mass.  MRA HEAD  Findings: Right vertebral artery is dominant and widely patent to the basilar.  Left vertebral artery is hypoplastic distally with minimal contribution to the basilar.  The basilar is widely patent. Superior cerebellar and posterior cerebral arteries are patent. Fetal origin of the right posterior cerebral artery with hypoplastic right P1 segment.  Cavernous carotid is patent bilaterally without stenosis.  Anterior and middle cerebral arteries  are patent without significant stenosis.  Negative for cerebral aneurysm.  IMPRESSION: No significant intracranial stenosis.  Negative for aneurysm.  MRA NECK  Findings: Proximal great vessels are patent bilaterally.  There is a mild stenosis of the proximal left subclavian artery.  Carotid artery is patent bilaterally.  There is atherosclerotic plaque involving the proximal internal carotid artery bilaterally, left greater than right without significant  stenosis.  External carotid artery is patent bilaterally.  Right vertebral artery is dominant and patent to the basilar. There is a mild stenosis at the origin of the vertebral artery bilaterally.  The distal left vertebral artery is hypoplastic.  IMPRESSION: Mild stenosis proximal left subclavian artery.   Mild stenosis at the origin of the vertebral artery bilaterally.  Mild atherosclerotic plaque at the proximal internal carotid artery bilaterally without significant carotid stenosis.   Original Report Authenticated By: Janeece Riggers, M.D.       1. Headache       MDM  Patient is feeling much better with improvement in his headache. CT and MRI did not show any evidence of intracranial hemorrhage, aneurysm or carotid artery dissection. Patient may discharged home in good condition will follow his primary care physician or return here as needed for any worsening symptoms. He has no other stroke sounding symptoms and no evidence of stroke on MRI.  MRI was performed due to pt's headache with associated symptoms of blurry vision and ataxia to r/o CVA.      Rolan Bucco, MD 01/20/13 9604  Rolan Bucco, MD 03/20/13 1059

## 2013-01-31 DIAGNOSIS — B354 Tinea corporis: Secondary | ICD-10-CM | POA: Diagnosis not present

## 2013-01-31 DIAGNOSIS — Z85828 Personal history of other malignant neoplasm of skin: Secondary | ICD-10-CM | POA: Diagnosis not present

## 2013-01-31 DIAGNOSIS — L738 Other specified follicular disorders: Secondary | ICD-10-CM | POA: Diagnosis not present

## 2013-02-21 DIAGNOSIS — I10 Essential (primary) hypertension: Secondary | ICD-10-CM | POA: Diagnosis not present

## 2013-02-21 DIAGNOSIS — E11319 Type 2 diabetes mellitus with unspecified diabetic retinopathy without macular edema: Secondary | ICD-10-CM | POA: Diagnosis not present

## 2013-02-21 DIAGNOSIS — E1139 Type 2 diabetes mellitus with other diabetic ophthalmic complication: Secondary | ICD-10-CM | POA: Diagnosis not present

## 2013-03-02 DIAGNOSIS — N402 Nodular prostate without lower urinary tract symptoms: Secondary | ICD-10-CM | POA: Diagnosis not present

## 2013-03-02 DIAGNOSIS — N401 Enlarged prostate with lower urinary tract symptoms: Secondary | ICD-10-CM | POA: Diagnosis not present

## 2013-05-29 DIAGNOSIS — E1142 Type 2 diabetes mellitus with diabetic polyneuropathy: Secondary | ICD-10-CM | POA: Diagnosis not present

## 2013-05-29 DIAGNOSIS — E11319 Type 2 diabetes mellitus with unspecified diabetic retinopathy without macular edema: Secondary | ICD-10-CM | POA: Diagnosis not present

## 2013-05-29 DIAGNOSIS — E1139 Type 2 diabetes mellitus with other diabetic ophthalmic complication: Secondary | ICD-10-CM | POA: Diagnosis not present

## 2013-05-29 DIAGNOSIS — M6530 Trigger finger, unspecified finger: Secondary | ICD-10-CM | POA: Diagnosis not present

## 2013-05-29 DIAGNOSIS — E1149 Type 2 diabetes mellitus with other diabetic neurological complication: Secondary | ICD-10-CM | POA: Diagnosis not present

## 2013-05-29 DIAGNOSIS — I10 Essential (primary) hypertension: Secondary | ICD-10-CM | POA: Diagnosis not present

## 2013-05-29 DIAGNOSIS — M19049 Primary osteoarthritis, unspecified hand: Secondary | ICD-10-CM | POA: Diagnosis not present

## 2013-06-15 DIAGNOSIS — H4011X0 Primary open-angle glaucoma, stage unspecified: Secondary | ICD-10-CM | POA: Diagnosis not present

## 2013-06-15 DIAGNOSIS — E1139 Type 2 diabetes mellitus with other diabetic ophthalmic complication: Secondary | ICD-10-CM | POA: Diagnosis not present

## 2013-06-15 DIAGNOSIS — H409 Unspecified glaucoma: Secondary | ICD-10-CM | POA: Diagnosis not present

## 2013-06-15 DIAGNOSIS — E11329 Type 2 diabetes mellitus with mild nonproliferative diabetic retinopathy without macular edema: Secondary | ICD-10-CM | POA: Diagnosis not present

## 2013-06-26 DIAGNOSIS — M6530 Trigger finger, unspecified finger: Secondary | ICD-10-CM | POA: Diagnosis not present

## 2013-06-26 DIAGNOSIS — M19049 Primary osteoarthritis, unspecified hand: Secondary | ICD-10-CM | POA: Diagnosis not present

## 2013-07-05 DIAGNOSIS — M722 Plantar fascial fibromatosis: Secondary | ICD-10-CM | POA: Diagnosis not present

## 2013-07-26 DIAGNOSIS — M722 Plantar fascial fibromatosis: Secondary | ICD-10-CM | POA: Diagnosis not present

## 2013-07-28 DIAGNOSIS — R109 Unspecified abdominal pain: Secondary | ICD-10-CM | POA: Diagnosis not present

## 2013-09-07 DIAGNOSIS — E1149 Type 2 diabetes mellitus with other diabetic neurological complication: Secondary | ICD-10-CM | POA: Diagnosis not present

## 2013-09-07 DIAGNOSIS — R1031 Right lower quadrant pain: Secondary | ICD-10-CM | POA: Diagnosis not present

## 2013-09-07 DIAGNOSIS — E1142 Type 2 diabetes mellitus with diabetic polyneuropathy: Secondary | ICD-10-CM | POA: Diagnosis not present

## 2013-09-07 DIAGNOSIS — E1139 Type 2 diabetes mellitus with other diabetic ophthalmic complication: Secondary | ICD-10-CM | POA: Diagnosis not present

## 2013-09-07 DIAGNOSIS — Z23 Encounter for immunization: Secondary | ICD-10-CM | POA: Diagnosis not present

## 2013-09-07 DIAGNOSIS — E11319 Type 2 diabetes mellitus with unspecified diabetic retinopathy without macular edema: Secondary | ICD-10-CM | POA: Diagnosis not present

## 2013-09-07 DIAGNOSIS — Z1331 Encounter for screening for depression: Secondary | ICD-10-CM | POA: Diagnosis not present

## 2013-09-07 DIAGNOSIS — I10 Essential (primary) hypertension: Secondary | ICD-10-CM | POA: Diagnosis not present

## 2013-11-16 DIAGNOSIS — M6530 Trigger finger, unspecified finger: Secondary | ICD-10-CM | POA: Diagnosis not present

## 2013-11-24 DIAGNOSIS — L538 Other specified erythematous conditions: Secondary | ICD-10-CM | POA: Diagnosis not present

## 2013-11-24 DIAGNOSIS — L259 Unspecified contact dermatitis, unspecified cause: Secondary | ICD-10-CM | POA: Diagnosis not present

## 2013-11-24 DIAGNOSIS — Z85828 Personal history of other malignant neoplasm of skin: Secondary | ICD-10-CM | POA: Diagnosis not present

## 2013-11-24 DIAGNOSIS — B354 Tinea corporis: Secondary | ICD-10-CM | POA: Diagnosis not present

## 2013-12-13 DIAGNOSIS — I10 Essential (primary) hypertension: Secondary | ICD-10-CM | POA: Diagnosis not present

## 2013-12-13 DIAGNOSIS — E1149 Type 2 diabetes mellitus with other diabetic neurological complication: Secondary | ICD-10-CM | POA: Diagnosis not present

## 2013-12-13 DIAGNOSIS — E1142 Type 2 diabetes mellitus with diabetic polyneuropathy: Secondary | ICD-10-CM | POA: Diagnosis not present

## 2013-12-13 DIAGNOSIS — E11319 Type 2 diabetes mellitus with unspecified diabetic retinopathy without macular edema: Secondary | ICD-10-CM | POA: Diagnosis not present

## 2013-12-13 DIAGNOSIS — E1139 Type 2 diabetes mellitus with other diabetic ophthalmic complication: Secondary | ICD-10-CM | POA: Diagnosis not present

## 2013-12-14 DIAGNOSIS — H4011X0 Primary open-angle glaucoma, stage unspecified: Secondary | ICD-10-CM | POA: Diagnosis not present

## 2013-12-14 DIAGNOSIS — E11329 Type 2 diabetes mellitus with mild nonproliferative diabetic retinopathy without macular edema: Secondary | ICD-10-CM | POA: Diagnosis not present

## 2013-12-14 DIAGNOSIS — H35359 Cystoid macular degeneration, unspecified eye: Secondary | ICD-10-CM | POA: Diagnosis not present

## 2013-12-14 DIAGNOSIS — E1139 Type 2 diabetes mellitus with other diabetic ophthalmic complication: Secondary | ICD-10-CM | POA: Diagnosis not present

## 2013-12-14 DIAGNOSIS — H409 Unspecified glaucoma: Secondary | ICD-10-CM | POA: Diagnosis not present

## 2013-12-20 ENCOUNTER — Encounter (INDEPENDENT_AMBULATORY_CARE_PROVIDER_SITE_OTHER): Payer: Medicare Other | Admitting: Ophthalmology

## 2013-12-20 DIAGNOSIS — E1139 Type 2 diabetes mellitus with other diabetic ophthalmic complication: Secondary | ICD-10-CM | POA: Diagnosis not present

## 2013-12-20 DIAGNOSIS — E1165 Type 2 diabetes mellitus with hyperglycemia: Secondary | ICD-10-CM | POA: Diagnosis not present

## 2013-12-20 DIAGNOSIS — H35039 Hypertensive retinopathy, unspecified eye: Secondary | ICD-10-CM

## 2013-12-20 DIAGNOSIS — H43819 Vitreous degeneration, unspecified eye: Secondary | ICD-10-CM

## 2013-12-20 DIAGNOSIS — I10 Essential (primary) hypertension: Secondary | ICD-10-CM

## 2013-12-20 DIAGNOSIS — H35419 Lattice degeneration of retina, unspecified eye: Secondary | ICD-10-CM | POA: Diagnosis not present

## 2013-12-20 DIAGNOSIS — E11319 Type 2 diabetes mellitus with unspecified diabetic retinopathy without macular edema: Secondary | ICD-10-CM | POA: Diagnosis not present

## 2014-01-01 ENCOUNTER — Other Ambulatory Visit (INDEPENDENT_AMBULATORY_CARE_PROVIDER_SITE_OTHER): Payer: Medicare Other | Admitting: Ophthalmology

## 2014-01-01 DIAGNOSIS — E1139 Type 2 diabetes mellitus with other diabetic ophthalmic complication: Secondary | ICD-10-CM

## 2014-01-01 DIAGNOSIS — H3581 Retinal edema: Secondary | ICD-10-CM

## 2014-01-01 DIAGNOSIS — E1165 Type 2 diabetes mellitus with hyperglycemia: Secondary | ICD-10-CM | POA: Diagnosis not present

## 2014-02-15 ENCOUNTER — Other Ambulatory Visit: Payer: Self-pay | Admitting: Internal Medicine

## 2014-02-15 ENCOUNTER — Ambulatory Visit
Admission: RE | Admit: 2014-02-15 | Discharge: 2014-02-15 | Disposition: A | Discharge status: Home / Self Care | Payer: Medicare Other | Source: Ambulatory Visit | Attending: Internal Medicine | Admitting: Internal Medicine

## 2014-02-15 DIAGNOSIS — M5030 Other cervical disc degeneration, unspecified cervical region: Secondary | ICD-10-CM | POA: Diagnosis not present

## 2014-02-15 DIAGNOSIS — M542 Cervicalgia: Secondary | ICD-10-CM

## 2014-03-06 DIAGNOSIS — N401 Enlarged prostate with lower urinary tract symptoms: Secondary | ICD-10-CM | POA: Diagnosis not present

## 2014-03-06 DIAGNOSIS — N138 Other obstructive and reflux uropathy: Secondary | ICD-10-CM | POA: Diagnosis not present

## 2014-03-06 DIAGNOSIS — N402 Nodular prostate without lower urinary tract symptoms: Secondary | ICD-10-CM | POA: Diagnosis not present

## 2014-03-06 DIAGNOSIS — N139 Obstructive and reflux uropathy, unspecified: Secondary | ICD-10-CM | POA: Diagnosis not present

## 2014-03-14 DIAGNOSIS — E1139 Type 2 diabetes mellitus with other diabetic ophthalmic complication: Secondary | ICD-10-CM | POA: Diagnosis not present

## 2014-03-14 DIAGNOSIS — E1142 Type 2 diabetes mellitus with diabetic polyneuropathy: Secondary | ICD-10-CM | POA: Diagnosis not present

## 2014-03-14 DIAGNOSIS — E11319 Type 2 diabetes mellitus with unspecified diabetic retinopathy without macular edema: Secondary | ICD-10-CM | POA: Diagnosis not present

## 2014-03-14 DIAGNOSIS — I10 Essential (primary) hypertension: Secondary | ICD-10-CM | POA: Diagnosis not present

## 2014-03-14 DIAGNOSIS — M542 Cervicalgia: Secondary | ICD-10-CM | POA: Diagnosis not present

## 2014-03-14 DIAGNOSIS — E1149 Type 2 diabetes mellitus with other diabetic neurological complication: Secondary | ICD-10-CM | POA: Diagnosis not present

## 2014-04-16 DIAGNOSIS — H4011X0 Primary open-angle glaucoma, stage unspecified: Secondary | ICD-10-CM | POA: Diagnosis not present

## 2014-04-16 DIAGNOSIS — E1139 Type 2 diabetes mellitus with other diabetic ophthalmic complication: Secondary | ICD-10-CM | POA: Diagnosis not present

## 2014-04-16 DIAGNOSIS — E11329 Type 2 diabetes mellitus with mild nonproliferative diabetic retinopathy without macular edema: Secondary | ICD-10-CM | POA: Diagnosis not present

## 2014-04-16 DIAGNOSIS — H409 Unspecified glaucoma: Secondary | ICD-10-CM | POA: Diagnosis not present

## 2014-04-30 DIAGNOSIS — M6530 Trigger finger, unspecified finger: Secondary | ICD-10-CM | POA: Diagnosis not present

## 2014-04-30 DIAGNOSIS — M19049 Primary osteoarthritis, unspecified hand: Secondary | ICD-10-CM | POA: Diagnosis not present

## 2014-05-04 ENCOUNTER — Ambulatory Visit (INDEPENDENT_AMBULATORY_CARE_PROVIDER_SITE_OTHER): Payer: Medicare Other | Admitting: Ophthalmology

## 2014-05-04 DIAGNOSIS — E11319 Type 2 diabetes mellitus with unspecified diabetic retinopathy without macular edema: Secondary | ICD-10-CM

## 2014-05-04 DIAGNOSIS — I10 Essential (primary) hypertension: Secondary | ICD-10-CM

## 2014-05-04 DIAGNOSIS — H35039 Hypertensive retinopathy, unspecified eye: Secondary | ICD-10-CM

## 2014-05-04 DIAGNOSIS — E1139 Type 2 diabetes mellitus with other diabetic ophthalmic complication: Secondary | ICD-10-CM

## 2014-05-04 DIAGNOSIS — E1165 Type 2 diabetes mellitus with hyperglycemia: Secondary | ICD-10-CM | POA: Diagnosis not present

## 2014-05-04 DIAGNOSIS — H43819 Vitreous degeneration, unspecified eye: Secondary | ICD-10-CM

## 2014-05-31 DIAGNOSIS — M6530 Trigger finger, unspecified finger: Secondary | ICD-10-CM | POA: Diagnosis not present

## 2014-05-31 DIAGNOSIS — M19049 Primary osteoarthritis, unspecified hand: Secondary | ICD-10-CM | POA: Diagnosis not present

## 2014-06-18 ENCOUNTER — Other Ambulatory Visit: Payer: Self-pay | Admitting: Orthopedic Surgery

## 2014-06-19 DIAGNOSIS — IMO0002 Reserved for concepts with insufficient information to code with codable children: Secondary | ICD-10-CM | POA: Diagnosis not present

## 2014-06-19 DIAGNOSIS — I4510 Unspecified right bundle-branch block: Secondary | ICD-10-CM | POA: Diagnosis not present

## 2014-06-19 DIAGNOSIS — S298XXA Other specified injuries of thorax, initial encounter: Secondary | ICD-10-CM | POA: Diagnosis not present

## 2014-06-19 DIAGNOSIS — S20219A Contusion of unspecified front wall of thorax, initial encounter: Secondary | ICD-10-CM | POA: Diagnosis not present

## 2014-06-19 DIAGNOSIS — I10 Essential (primary) hypertension: Secondary | ICD-10-CM | POA: Diagnosis not present

## 2014-06-19 DIAGNOSIS — R079 Chest pain, unspecified: Secondary | ICD-10-CM | POA: Diagnosis not present

## 2014-06-19 DIAGNOSIS — E119 Type 2 diabetes mellitus without complications: Secondary | ICD-10-CM | POA: Diagnosis not present

## 2014-06-20 DIAGNOSIS — R079 Chest pain, unspecified: Secondary | ICD-10-CM | POA: Diagnosis not present

## 2014-06-22 ENCOUNTER — Encounter (HOSPITAL_BASED_OUTPATIENT_CLINIC_OR_DEPARTMENT_OTHER): Payer: Self-pay | Admitting: *Deleted

## 2014-06-22 NOTE — Progress Notes (Signed)
Had a fall in [place] last week-bruised rib-called for lab ekg cxr

## 2014-06-28 ENCOUNTER — Encounter (HOSPITAL_BASED_OUTPATIENT_CLINIC_OR_DEPARTMENT_OTHER): Payer: Medicare Other | Admitting: Anesthesiology

## 2014-06-28 ENCOUNTER — Encounter (HOSPITAL_BASED_OUTPATIENT_CLINIC_OR_DEPARTMENT_OTHER): Payer: Self-pay | Admitting: Orthopedic Surgery

## 2014-06-28 ENCOUNTER — Ambulatory Visit (HOSPITAL_BASED_OUTPATIENT_CLINIC_OR_DEPARTMENT_OTHER): Payer: Medicare Other | Admitting: Anesthesiology

## 2014-06-28 ENCOUNTER — Ambulatory Visit (HOSPITAL_BASED_OUTPATIENT_CLINIC_OR_DEPARTMENT_OTHER)
Admission: RE | Admit: 2014-06-28 | Discharge: 2014-06-28 | Disposition: A | Discharge status: Home / Self Care | Payer: Medicare Other | Source: Ambulatory Visit | Attending: Orthopedic Surgery | Admitting: Orthopedic Surgery

## 2014-06-28 ENCOUNTER — Encounter (HOSPITAL_BASED_OUTPATIENT_CLINIC_OR_DEPARTMENT_OTHER)
Admission: RE | Disposition: A | Discharge status: Home / Self Care | Payer: Self-pay | Source: Ambulatory Visit | Attending: Orthopedic Surgery

## 2014-06-28 DIAGNOSIS — I10 Essential (primary) hypertension: Secondary | ICD-10-CM | POA: Insufficient documentation

## 2014-06-28 DIAGNOSIS — E119 Type 2 diabetes mellitus without complications: Secondary | ICD-10-CM | POA: Insufficient documentation

## 2014-06-28 DIAGNOSIS — Z79899 Other long term (current) drug therapy: Secondary | ICD-10-CM | POA: Insufficient documentation

## 2014-06-28 DIAGNOSIS — M65839 Other synovitis and tenosynovitis, unspecified forearm: Secondary | ICD-10-CM | POA: Diagnosis not present

## 2014-06-28 DIAGNOSIS — Z7982 Long term (current) use of aspirin: Secondary | ICD-10-CM | POA: Insufficient documentation

## 2014-06-28 DIAGNOSIS — Z87891 Personal history of nicotine dependence: Secondary | ICD-10-CM | POA: Insufficient documentation

## 2014-06-28 DIAGNOSIS — K219 Gastro-esophageal reflux disease without esophagitis: Secondary | ICD-10-CM | POA: Diagnosis not present

## 2014-06-28 DIAGNOSIS — M6530 Trigger finger, unspecified finger: Secondary | ICD-10-CM | POA: Diagnosis not present

## 2014-06-28 DIAGNOSIS — M65849 Other synovitis and tenosynovitis, unspecified hand: Principal | ICD-10-CM

## 2014-06-28 DIAGNOSIS — M659 Synovitis and tenosynovitis, unspecified: Secondary | ICD-10-CM | POA: Diagnosis not present

## 2014-06-28 HISTORY — DX: Unspecified osteoarthritis, unspecified site: M19.90

## 2014-06-28 HISTORY — DX: Anemia, unspecified: D64.9

## 2014-06-28 HISTORY — PX: TRIGGER FINGER RELEASE: SHX641

## 2014-06-28 HISTORY — DX: Presence of dental prosthetic device (complete) (partial): Z97.2

## 2014-06-28 HISTORY — DX: Gastro-esophageal reflux disease without esophagitis: K21.9

## 2014-06-28 HISTORY — DX: Presence of spectacles and contact lenses: Z97.3

## 2014-06-28 LAB — POCT HEMOGLOBIN-HEMACUE: Hemoglobin: 10.2 g/dL — ABNORMAL LOW (ref 13.0–17.0)

## 2014-06-28 LAB — GLUCOSE, CAPILLARY
Glucose-Capillary: 108 mg/dL — ABNORMAL HIGH (ref 70–99)
Glucose-Capillary: 95 mg/dL (ref 70–99)

## 2014-06-28 SURGERY — RELEASE, A1 PULLEY, FOR TRIGGER FINGER
Anesthesia: Monitor Anesthesia Care | Site: Finger | Laterality: Right

## 2014-06-28 MED ORDER — BUPIVACAINE HCL (PF) 0.25 % IJ SOLN
INTRAMUSCULAR | Status: DC | PRN
  Administered 2014-06-28: 4 mL

## 2014-06-28 MED ORDER — LIDOCAINE HCL (PF) 0.5 % IJ SOLN
INTRAMUSCULAR | Status: DC | PRN
  Administered 2014-06-28: 30 mL via INTRAVENOUS

## 2014-06-28 MED ORDER — FENTANYL CITRATE 0.05 MG/ML IJ SOLN
INTRAMUSCULAR | Status: AC
  Filled 2014-06-28: qty 4

## 2014-06-28 MED ORDER — CEFAZOLIN SODIUM-DEXTROSE 2-3 GM-% IV SOLR
2.0000 g | INTRAVENOUS | Status: AC
  Administered 2014-06-28: 2 g via INTRAVENOUS

## 2014-06-28 MED ORDER — PROPOFOL INFUSION 10 MG/ML OPTIME
INTRAVENOUS | Status: DC | PRN
  Administered 2014-06-28: 75 ug/kg/min via INTRAVENOUS

## 2014-06-28 MED ORDER — MIDAZOLAM HCL 2 MG/2ML IJ SOLN: 1.0000 mg | INTRAMUSCULAR | Status: DC | PRN

## 2014-06-28 MED ORDER — ONDANSETRON HCL 4 MG/2ML IJ SOLN: 4.0000 mg | Freq: Once | INTRAMUSCULAR | Status: DC | PRN

## 2014-06-28 MED ORDER — LIDOCAINE HCL (CARDIAC) 20 MG/ML IV SOLN
INTRAVENOUS | Status: DC | PRN
  Administered 2014-06-28: 30 mg via INTRAVENOUS

## 2014-06-28 MED ORDER — FENTANYL CITRATE 0.05 MG/ML IJ SOLN
25.0000 ug | INTRAMUSCULAR | Status: DC | PRN
Start: 2014-06-28 — End: 2014-06-28

## 2014-06-28 MED ORDER — FENTANYL CITRATE 0.05 MG/ML IJ SOLN
INTRAMUSCULAR | Status: DC | PRN
  Administered 2014-06-28: 25 ug via INTRAVENOUS

## 2014-06-28 MED ORDER — ONDANSETRON HCL 4 MG/2ML IJ SOLN
INTRAMUSCULAR | Status: DC | PRN
  Administered 2014-06-28: 4 mg via INTRAVENOUS

## 2014-06-28 MED ORDER — LACTATED RINGERS IV SOLN
INTRAVENOUS | Status: DC
  Administered 2014-06-28: 09:00:00 via INTRAVENOUS

## 2014-06-28 MED ORDER — CHLORHEXIDINE GLUCONATE 4 % EX LIQD: 60.0000 mL | Freq: Once | CUTANEOUS | Status: DC

## 2014-06-28 MED ORDER — FENTANYL CITRATE 0.05 MG/ML IJ SOLN: 50.0000 ug | INTRAMUSCULAR | Status: DC | PRN

## 2014-06-28 MED ORDER — TRAMADOL HCL 50 MG PO TABS: 50.0000 mg | ORAL_TABLET | Freq: Four times a day (QID) | ORAL | Status: DC | PRN

## 2014-06-28 MED ORDER — CEFAZOLIN SODIUM-DEXTROSE 2-3 GM-% IV SOLR: 2.0000 g | INTRAVENOUS | Status: DC

## 2014-06-28 MED ORDER — CEFAZOLIN SODIUM-DEXTROSE 2-3 GM-% IV SOLR
INTRAVENOUS | Status: AC
  Filled 2014-06-28: qty 50

## 2014-06-28 SURGICAL SUPPLY — 77 items
BAG DECANTER FOR FLEXI CONT (MISCELLANEOUS) IMPLANT
BALL CTTN LRG ABS STRL LF (GAUZE/BANDAGES/DRESSINGS)
BANDAGE COBAN STERILE 2 (GAUZE/BANDAGES/DRESSINGS) ×2 IMPLANT
BLADE MINI RND TIP GREEN BEAV (BLADE) ×2 IMPLANT
BLADE SURG 15 STRL LF DISP TIS (BLADE) ×1 IMPLANT
BLADE SURG 15 STRL SS (BLADE) ×3
BNDG CMPR 9X4 STRL LF SNTH (GAUZE/BANDAGES/DRESSINGS)
BNDG COHESIVE 3X5 TAN STRL LF (GAUZE/BANDAGES/DRESSINGS) ×1 IMPLANT
BNDG ESMARK 4X9 LF (GAUZE/BANDAGES/DRESSINGS) IMPLANT
BNDG GAUZE ELAST 4 BULKY (GAUZE/BANDAGES/DRESSINGS) ×1 IMPLANT
CHLORAPREP W/TINT 26ML (MISCELLANEOUS) ×3 IMPLANT
CORDS BIPOLAR (ELECTRODE) ×3 IMPLANT
COTTONBALL LRG STERILE PKG (GAUZE/BANDAGES/DRESSINGS) IMPLANT
COVER MAYO STAND STRL (DRAPES) ×3 IMPLANT
COVER TABLE BACK 60X90 (DRAPES) ×3 IMPLANT
CUFF TOURNIQUET SINGLE 18IN (TOURNIQUET CUFF) ×2 IMPLANT
DECANTER SPIKE VIAL GLASS SM (MISCELLANEOUS) IMPLANT
DRAIN TLS ROUND 10FR (DRAIN) IMPLANT
DRAPE EXTREMITY T 121X128X90 (DRAPE) ×3 IMPLANT
DRAPE OEC MINIVIEW 54X84 (DRAPES) IMPLANT
DRAPE SURG 17X23 STRL (DRAPES) ×3 IMPLANT
DRSG KUZMA FLUFF (GAUZE/BANDAGES/DRESSINGS) IMPLANT
GAUZE SPONGE 4X4 12PLY STRL (GAUZE/BANDAGES/DRESSINGS) ×3 IMPLANT
GAUZE SPONGE 4X4 16PLY XRAY LF (GAUZE/BANDAGES/DRESSINGS) IMPLANT
GAUZE XEROFORM 1X8 LF (GAUZE/BANDAGES/DRESSINGS) ×3 IMPLANT
GLOVE BIOGEL PI IND STRL 7.0 (GLOVE) IMPLANT
GLOVE BIOGEL PI IND STRL 8.5 (GLOVE) ×1 IMPLANT
GLOVE BIOGEL PI INDICATOR 7.0 (GLOVE) ×2
GLOVE BIOGEL PI INDICATOR 8.5 (GLOVE) ×2
GLOVE ECLIPSE 6.5 STRL STRAW (GLOVE) ×2 IMPLANT
GLOVE EXAM NITRILE LRG STRL (GLOVE) ×2 IMPLANT
GLOVE SURG ORTHO 8.0 STRL STRW (GLOVE) ×3 IMPLANT
GOWN STRL REUS W/ TWL LRG LVL3 (GOWN DISPOSABLE) ×1 IMPLANT
GOWN STRL REUS W/TWL LRG LVL3 (GOWN DISPOSABLE) ×3
GOWN STRL REUS W/TWL XL LVL3 (GOWN DISPOSABLE) ×3 IMPLANT
LOOP VESSEL MAXI BLUE (MISCELLANEOUS) IMPLANT
NDL KEITH (NEEDLE) IMPLANT
NEEDLE 27GAX1X1/2 (NEEDLE) ×2 IMPLANT
NEEDLE HYPO 22GX1.5 SAFETY (NEEDLE) IMPLANT
NEEDLE KEITH (NEEDLE) IMPLANT
NS IRRIG 1000ML POUR BTL (IV SOLUTION) ×3 IMPLANT
PACK BASIN DAY SURGERY FS (CUSTOM PROCEDURE TRAY) ×3 IMPLANT
PAD CAST 3X4 CTTN HI CHSV (CAST SUPPLIES) ×1 IMPLANT
PADDING CAST ABS 3INX4YD NS (CAST SUPPLIES)
PADDING CAST ABS 4INX4YD NS (CAST SUPPLIES)
PADDING CAST ABS COTTON 3X4 (CAST SUPPLIES) IMPLANT
PADDING CAST ABS COTTON 4X4 ST (CAST SUPPLIES) ×1 IMPLANT
PADDING CAST COTTON 3X4 STRL (CAST SUPPLIES)
SLEEVE SCD COMPRESS KNEE MED (MISCELLANEOUS) IMPLANT
SPLINT PLASTER CAST XFAST 3X15 (CAST SUPPLIES) IMPLANT
SPLINT PLASTER XTRA FASTSET 3X (CAST SUPPLIES)
STOCKINETTE 4X48 STRL (DRAPES) ×3 IMPLANT
SUT CHROMIC 5 0 P 3 (SUTURE) IMPLANT
SUT ETHIBOND 3-0 V-5 (SUTURE) IMPLANT
SUT FIBERWIRE 2-0 18 17.9 3/8 (SUTURE)
SUT FIBERWIRE 4-0 18 TAPR NDL (SUTURE)
SUT MERSILENE 2.0 SH NDLE (SUTURE) IMPLANT
SUT MERSILENE 3 0 FS 1 (SUTURE) IMPLANT
SUT MERSILENE 4 0 P 3 (SUTURE) IMPLANT
SUT POLY BUTTON 15MM (SUTURE) IMPLANT
SUT PROLENE 2 0 SH DA (SUTURE) IMPLANT
SUT SILK 2 0 FS (SUTURE) IMPLANT
SUT SILK 4 0 PS 2 (SUTURE) IMPLANT
SUT VIC AB 3-0 PS1 18 (SUTURE)
SUT VIC AB 3-0 PS1 18XBRD (SUTURE) IMPLANT
SUT VIC AB 4-0 P-3 18XBRD (SUTURE) IMPLANT
SUT VIC AB 4-0 P3 18 (SUTURE)
SUT VICRYL 4-0 PS2 18IN ABS (SUTURE) IMPLANT
SUT VICRYL RAPID 5 0 P 3 (SUTURE) IMPLANT
SUT VICRYL RAPIDE 4/0 PS 2 (SUTURE) ×3 IMPLANT
SUTURE FIBERWR 2-0 18 17.9 3/8 (SUTURE) IMPLANT
SUTURE FIBERWR 4-0 18 TAPR NDL (SUTURE) IMPLANT
SYR BULB 3OZ (MISCELLANEOUS) ×3 IMPLANT
SYR CONTROL 10ML LL (SYRINGE) ×2 IMPLANT
TOWEL OR 17X24 6PK STRL BLUE (TOWEL DISPOSABLE) ×4 IMPLANT
TUBE FEEDING 5FR 15 INCH (TUBING) IMPLANT
UNDERPAD 30X30 INCONTINENT (UNDERPADS AND DIAPERS) ×1 IMPLANT

## 2014-06-28 NOTE — H&P (Signed)
  Andre Mills is an 80 year old right hand dominant male, husband of Andre Mills a former patient. He comes in complaining of catching of his right middle finger. This has been going on for several weeks. He has a history of diabetes and arthritis. He recalls no history of injury. He complains of moderate discomfort intermittent in nature. He feels it is gradually getting worse. After a thorough prep and informed consent the A-1 pulley right middle finger is injected.  This continues to catch for him despite injections.   PAST MEDICAL HISTORY: He is allergic to Marirodato, Limbitrol, Septra, Flexeril, Mobic and Visages. He is on Lisinopril, Actos, Omeprazole, aspirin, Simvastatin, Metformin, Gabapentin, Glipizide and Timolol. He relates no surgery.   FAMILY H ISTORY: Positive for diabetes, otherwise negative.  SOCIAL HISTORY: He does not smoke or drink. He is married and retired.  REVIEW OF SYSTEMS: Positive for glasses, otherwise negative for 14 points.  Andre Mills is an 80 y.o. male.   Chief Complaint: STS right middle finger HPI: see above  Past Medical History  Diagnosis Date  . High cholesterol   . Hypertension   . Diabetes mellitus without complication   . Wears glasses   . Wears dentures     TOP  . GERD (gastroesophageal reflux disease)   . Arthritis   . Anemia     Past Surgical History  Procedure Laterality Date  . Tonsillectomy    . Eye surgery      both cataracts  . Knee arthroscopy      left  . Colonoscopy w/ biopsies and polypectomy    . Colonoscopy      History reviewed. No pertinent family history. Social History:  reports that he quit smoking about 55 years ago. He does not have any smokeless tobacco history on file. He reports that he does not drink alcohol or use illicit drugs.  Allergies:  Allergies  Allergen Reactions  . Flexeril [Cyclobenzaprine]   . Limbitrol Ds [Chlordiazepoxide-Amitriptyline]   . Macrodantin [Nitrofurantoin Macrocrystal]   .  Mobic [Meloxicam]   . Septra [Sulfamethoxazole-Trimethoprim]     No prescriptions prior to admission    No results found for this or any previous visit (from the past 48 hour(s)).  No results found.   Pertinent items are noted in HPI.  Height 5\' 7"  (1.702 m), weight 74.39 kg (164 lb).  General appearance: alert, cooperative and appears stated age Head: Normocephalic, without obvious abnormality Neck: no JVD Resp: clear to auscultation bilaterally Cardio: regular rate and rhythm, S1, S2 normal, no murmur, click, rub or gallop GI: soft, non-tender; bowel sounds normal; no masses,  no organomegaly Extremities: Trigger right middle finger Pulses: 2+ and symmetric Skin: Skin color, texture, turgor normal. No rashes or lesions Neurologic: Grossly normal Incision/Wound: na  Assessment/Plan X-rays reveal degenerative changes at the PIP and DIP joints of all fingers.   Diagnosis: STS right middle finger with degenerative arthritis relatively asymptomatic.   We would recommend surgical release to the A-1 pulley, possible excision to one slip of the superficialis.  The pre, peri and postoperative course were discussed along with the risks and complications.  The patient is aware there is no guarantee with the surgery, possibility of infection, recurrence, injury to arteries, nerves, tendons, incomplete relief of symptoms and dystrophy.  This will be scheduled as an outpatient under regional anesthesia.  Jasdeep Kepner R 06/28/2014, 7:39 AM

## 2014-06-28 NOTE — Brief Op Note (Signed)
06/28/2014  9:59 AM  PATIENT:  [name]  80 y.o. male  PRE-OPERATIVE DIAGNOSIS:  STENOSING TENOSYNOVITIS RIGHT MIDDLE FINGER   POST-OPERATIVE DIAGNOSIS:  STENOSING TENOSYNOVITIS RIGHT MIDDLE FINGER   PROCEDURE:  Procedure(s): RELEASE A-1 PULLEY POSSIBLE EXCISION ONE SLIP SUPERFICIALIS  RIGHT MIDDLE FINGER  (Right)  SURGEON:  Surgeon(s) and Role:    * Daryll Brod, MD - Primary  PHYSICIAN ASSISTANT:   ASSISTANTS: none   ANESTHESIA:   local and regional  EBL:  Total I/O In: 800 [I.V.:800] Out: -   BLOOD ADMINISTERED:none  DRAINS: none   LOCAL MEDICATIONS USED:  BUPIVICAINE   SPECIMEN:  No Specimen  DISPOSITION OF SPECIMEN:  N/A  COUNTS:  YES  TOURNIQUET:   Total Tourniquet Time Documented: Forearm (Right) - 18 minutes Total: Forearm (Right) - 18 minutes   DICTATION: .Other Dictation: Dictation Number [id number]  PLAN OF CARE: Discharge to home after PACU  PATIENT DISPOSITION:  PACU - hemodynamically stable.

## 2014-06-28 NOTE — Anesthesia Procedure Notes (Signed)
Procedure Name: MAC Date/Time: 06/28/2014 9:25 AM Performed by: Lyda Colcord Pre-anesthesia Checklist: Patient identified, Emergency Drugs available, Suction available, Patient being monitored and Timeout performed Patient Re-evaluated:Patient Re-evaluated prior to inductionOxygen Delivery Method: Simple face mask

## 2014-06-28 NOTE — Discharge Instructions (Addendum)

## 2014-06-28 NOTE — Op Note (Signed)
Dictation Number [id number]

## 2014-06-28 NOTE — Anesthesia Preprocedure Evaluation (Addendum)
Anesthesia Evaluation  Patient identified by MRN, date of birth, ID band Patient awake    Reviewed: Allergy & Precautions, H&P , NPO status , Patient's Chart, lab work & pertinent test results  Airway Mallampati: I TM Distance: >3 FB Neck ROM: Full    Dental  (+) Upper Dentures, Dental Advisory Given   Pulmonary former smoker,  breath sounds clear to auscultation        Cardiovascular hypertension, Pt. on medications Rhythm:Regular Rate:Normal     Neuro/Psych    GI/Hepatic GERD-  Medicated and Controlled,  Endo/Other  diabetes, Well Controlled, Type 2, Oral Hypoglycemic Agents  Renal/GU      Musculoskeletal   Abdominal   Peds  Hematology   Anesthesia Other Findings   Reproductive/Obstetrics                          Anesthesia Physical Anesthesia Plan  ASA: III  Anesthesia Plan: MAC and Bier Block   Post-op Pain Management:    Induction: Intravenous  Airway Management Planned: Simple Face Mask  Additional Equipment:   Intra-op Plan:   Post-operative Plan:   Informed Consent: I have reviewed the patients History and Physical, chart, labs and discussed the procedure including the risks, benefits and alternatives for the proposed anesthesia with the patient or authorized representative who has indicated his/her understanding and acceptance.   Dental advisory given  Plan Discussed with: CRNA, Anesthesiologist and Surgeon  Anesthesia Plan Comments:         Anesthesia Quick Evaluation

## 2014-06-28 NOTE — Anesthesia Postprocedure Evaluation (Signed)
  Anesthesia Post-op Note  Patient: Andre Mills  Procedure(s) Performed: Procedure(s): RELEASE A-1 PULLEY POSSIBLE EXCISION ONE SLIP SUPERFICIALIS  RIGHT MIDDLE FINGER  (Right)  Patient Location: PACU  Anesthesia Type: MAC, Bier Block   Level of Consciousness: awake, alert  and oriented  Airway and Oxygen Therapy: Patient Spontanous Breathing  Post-op Pain: mild  Post-op Assessment: Post-op Vital signs reviewed  Post-op Vital Signs: Reviewed  Last Vitals:  Filed Vitals:   06/28/14 1000  BP: 145/57  Pulse: 52  Temp: 36.4 C  Resp: 16    Complications: No apparent anesthesia complications

## 2014-06-28 NOTE — Op Note (Signed)
NAME:  BRENNIN, DURFEE                 ACCOUNT NO.:  [id number]  MEDICAL RECORD NO.:  [id number]  LOCATION:                                 FACILITY:  PHYSICIAN:  Daryll Brod, M.D.            DATE OF BIRTH:  DATE OF PROCEDURE:  06/28/2014 DATE OF DISCHARGE:                              OPERATIVE REPORT   PREOPERATIVE DIAGNOSIS:  Stenosing tenosynovitis, right middle finger.  POSTOPERATIVE DIAGNOSIS:  Stenosing tenosynovitis, right middle finger.  OPERATION:  Release A1 pulley with partial tenosynovectomy, right middle finger.  SURGEON:  Daryll Brod, MD  ANESTHESIA:  Forearm based IV regional with local infiltration.  ANESTHESIOLOGIST:  Lorrene Reid, MD  HISTORY:  The patient is an 80 year old male with a history of triggering of his right middle finger.  This has not responded to conservative treatment including 3 injections.  Pre, peri, and postoperative course have been discussed along with risks and complications.  He is aware that there is no guarantee with the surgery, possibility of infection, recurrence of injury to arteries, nerves, tendons, incomplete relief of symptoms, and dystrophy.  In the preoperative area, the patient is seen, the extremity marked by both patient and surgeon, antibiotic given.  PROCEDURE IN DETAIL:  The patient was brought to the operating room, where a forearm-based IV regional anesthetic was carried out without difficulty.  He was prepped using ChloraPrep, supine position with the right arm free.  A 3-minute dry time was allowed.  Time-out taken, confirming the patient and procedure.  An oblique incision was made over the A1 pulley of the right middle finger and carried down through subcutaneous tissue.  Bleeders were electrocauterized with bipolar. Neurovascular bundles identified and protected with retractors.  The A1 pulley was found to be thickened.  A very significant tenosynovitis was present with adherence of the superficialis profundus  tendon.  With blunt and sharp dissection, the A1 pulley was released on its radial aspect.  A small incision made centrally in A2.  Partial tenosynovectomy was performed proximally separating the 2 tendons.  With flexion and extension, a mild amount of triggering was still noted.  The tendons were then adequately separated.  The chiasm of Camper had slightly scarred constricting the profundus.  This was separated and the triggering disappeared.  The wound was copiously irrigated with saline. A local infiltration with 0.25% Marcaine without epinephrine was given, approximately 6 mL was used.  The wound was then closed with interrupted 4-0 Vicryl Rapide sutures.  Sterile compressive dressing with fingers free was applied.  On deflation of the tourniquet, all fingers immediately pinked.  He was taken to the recovery room for observation in satisfactory condition.  He will be discharged home to return to the [facility] in 1 week on Ultram.          ______________________________ Daryll Brod, M.D.     GK/MEDQ  D:  06/28/2014  T:  06/28/2014  Job:  [id number]

## 2014-06-28 NOTE — Transfer of Care (Signed)
Immediate Anesthesia Transfer of Care Note  Patient: Andre Mills  Procedure(s) Performed: Procedure(s): RELEASE A-1 PULLEY POSSIBLE EXCISION ONE SLIP SUPERFICIALIS  RIGHT MIDDLE FINGER  (Right)  Patient Location: PACU  Anesthesia Type:MAC and Bier block  Level of Consciousness: awake, alert , oriented and patient cooperative  Airway & Oxygen Therapy: Patient Spontanous Breathing and Patient connected to face mask oxygen  Post-op Assessment: Report given to PACU RN and Post -op Vital signs reviewed and stable  Post vital signs: Reviewed and stable  Complications: No apparent anesthesia complications

## 2014-06-29 ENCOUNTER — Encounter (HOSPITAL_BASED_OUTPATIENT_CLINIC_OR_DEPARTMENT_OTHER): Payer: Self-pay | Admitting: Orthopedic Surgery

## 2014-06-29 ENCOUNTER — Other Ambulatory Visit: Payer: Self-pay | Admitting: Internal Medicine

## 2014-06-29 ENCOUNTER — Ambulatory Visit
Admission: RE | Admit: 2014-06-29 | Discharge: 2014-06-29 | Disposition: A | Discharge status: Home / Self Care | Payer: Medicare Other | Source: Ambulatory Visit | Attending: Internal Medicine | Admitting: Internal Medicine

## 2014-06-29 DIAGNOSIS — K7689 Other specified diseases of liver: Secondary | ICD-10-CM | POA: Diagnosis not present

## 2014-06-29 DIAGNOSIS — D649 Anemia, unspecified: Secondary | ICD-10-CM | POA: Diagnosis not present

## 2014-06-29 DIAGNOSIS — S20219A Contusion of unspecified front wall of thorax, initial encounter: Secondary | ICD-10-CM | POA: Diagnosis not present

## 2014-06-29 MED ORDER — IOHEXOL 300 MG/ML  SOLN
100.0000 mL | Freq: Once | INTRAMUSCULAR | Status: AC | PRN
  Administered 2014-06-29: 100 mL via INTRAVENOUS

## 2014-07-09 DIAGNOSIS — D649 Anemia, unspecified: Secondary | ICD-10-CM | POA: Diagnosis not present

## 2014-07-10 DIAGNOSIS — D649 Anemia, unspecified: Secondary | ICD-10-CM | POA: Diagnosis not present

## 2014-07-11 DIAGNOSIS — D649 Anemia, unspecified: Secondary | ICD-10-CM | POA: Diagnosis not present

## 2014-07-12 DIAGNOSIS — D649 Anemia, unspecified: Secondary | ICD-10-CM | POA: Diagnosis not present

## 2014-07-13 DIAGNOSIS — D649 Anemia, unspecified: Secondary | ICD-10-CM | POA: Diagnosis not present

## 2014-07-17 DIAGNOSIS — I10 Essential (primary) hypertension: Secondary | ICD-10-CM | POA: Diagnosis not present

## 2014-07-17 DIAGNOSIS — E782 Mixed hyperlipidemia: Secondary | ICD-10-CM | POA: Diagnosis not present

## 2014-07-17 DIAGNOSIS — Z23 Encounter for immunization: Secondary | ICD-10-CM | POA: Diagnosis not present

## 2014-07-17 DIAGNOSIS — D649 Anemia, unspecified: Secondary | ICD-10-CM | POA: Diagnosis not present

## 2014-07-17 DIAGNOSIS — K5900 Constipation, unspecified: Secondary | ICD-10-CM | POA: Diagnosis not present

## 2014-07-17 DIAGNOSIS — R079 Chest pain, unspecified: Secondary | ICD-10-CM | POA: Diagnosis not present

## 2014-07-17 DIAGNOSIS — E1139 Type 2 diabetes mellitus with other diabetic ophthalmic complication: Secondary | ICD-10-CM | POA: Diagnosis not present

## 2014-07-17 DIAGNOSIS — E538 Deficiency of other specified B group vitamins: Secondary | ICD-10-CM | POA: Diagnosis not present

## 2014-07-17 DIAGNOSIS — G609 Hereditary and idiopathic neuropathy, unspecified: Secondary | ICD-10-CM | POA: Diagnosis not present

## 2014-07-23 DIAGNOSIS — D649 Anemia, unspecified: Secondary | ICD-10-CM | POA: Diagnosis not present

## 2014-07-25 DIAGNOSIS — H409 Unspecified glaucoma: Secondary | ICD-10-CM | POA: Diagnosis not present

## 2014-07-25 DIAGNOSIS — H4011X0 Primary open-angle glaucoma, stage unspecified: Secondary | ICD-10-CM | POA: Diagnosis not present

## 2014-07-30 DIAGNOSIS — D649 Anemia, unspecified: Secondary | ICD-10-CM | POA: Diagnosis not present

## 2014-08-06 DIAGNOSIS — D649 Anemia, unspecified: Secondary | ICD-10-CM | POA: Diagnosis not present

## 2014-09-06 DIAGNOSIS — D649 Anemia, unspecified: Secondary | ICD-10-CM | POA: Diagnosis not present

## 2014-09-24 DIAGNOSIS — L821 Other seborrheic keratosis: Secondary | ICD-10-CM | POA: Diagnosis not present

## 2014-09-24 DIAGNOSIS — Z85828 Personal history of other malignant neoplasm of skin: Secondary | ICD-10-CM | POA: Diagnosis not present

## 2014-09-24 DIAGNOSIS — B354 Tinea corporis: Secondary | ICD-10-CM | POA: Diagnosis not present

## 2014-09-27 DIAGNOSIS — H4011X3 Primary open-angle glaucoma, severe stage: Secondary | ICD-10-CM | POA: Diagnosis not present

## 2014-10-08 DIAGNOSIS — D649 Anemia, unspecified: Secondary | ICD-10-CM | POA: Diagnosis not present

## 2014-11-05 ENCOUNTER — Ambulatory Visit (INDEPENDENT_AMBULATORY_CARE_PROVIDER_SITE_OTHER): Payer: Medicare Other | Admitting: Ophthalmology

## 2014-11-05 DIAGNOSIS — E11319 Type 2 diabetes mellitus with unspecified diabetic retinopathy without macular edema: Secondary | ICD-10-CM | POA: Diagnosis not present

## 2014-11-05 DIAGNOSIS — I10 Essential (primary) hypertension: Secondary | ICD-10-CM

## 2014-11-05 DIAGNOSIS — H35033 Hypertensive retinopathy, bilateral: Secondary | ICD-10-CM | POA: Diagnosis not present

## 2014-11-05 DIAGNOSIS — E11339 Type 2 diabetes mellitus with moderate nonproliferative diabetic retinopathy without macular edema: Secondary | ICD-10-CM | POA: Diagnosis not present

## 2014-11-05 DIAGNOSIS — H43813 Vitreous degeneration, bilateral: Secondary | ICD-10-CM

## 2014-11-08 DIAGNOSIS — D649 Anemia, unspecified: Secondary | ICD-10-CM | POA: Diagnosis not present

## 2014-11-20 DIAGNOSIS — E1339 Other specified diabetes mellitus with other diabetic ophthalmic complication: Secondary | ICD-10-CM | POA: Diagnosis not present

## 2014-11-20 DIAGNOSIS — D649 Anemia, unspecified: Secondary | ICD-10-CM | POA: Diagnosis not present

## 2014-11-20 DIAGNOSIS — G609 Hereditary and idiopathic neuropathy, unspecified: Secondary | ICD-10-CM | POA: Diagnosis not present

## 2014-11-20 DIAGNOSIS — M6530 Trigger finger, unspecified finger: Secondary | ICD-10-CM | POA: Diagnosis not present

## 2014-11-20 DIAGNOSIS — E11319 Type 2 diabetes mellitus with unspecified diabetic retinopathy without macular edema: Secondary | ICD-10-CM | POA: Diagnosis not present

## 2014-12-10 DIAGNOSIS — D649 Anemia, unspecified: Secondary | ICD-10-CM | POA: Diagnosis not present

## 2014-12-21 DIAGNOSIS — J069 Acute upper respiratory infection, unspecified: Secondary | ICD-10-CM | POA: Diagnosis not present

## 2015-01-09 DIAGNOSIS — D649 Anemia, unspecified: Secondary | ICD-10-CM | POA: Diagnosis not present

## 2015-01-10 DIAGNOSIS — J309 Allergic rhinitis, unspecified: Secondary | ICD-10-CM | POA: Diagnosis not present

## 2015-01-31 DIAGNOSIS — H4011X3 Primary open-angle glaucoma, severe stage: Secondary | ICD-10-CM | POA: Diagnosis not present

## 2015-01-31 DIAGNOSIS — E11339 Type 2 diabetes mellitus with moderate nonproliferative diabetic retinopathy without macular edema: Secondary | ICD-10-CM | POA: Diagnosis not present

## 2015-02-11 DIAGNOSIS — D509 Iron deficiency anemia, unspecified: Secondary | ICD-10-CM | POA: Diagnosis not present

## 2015-03-14 DIAGNOSIS — E538 Deficiency of other specified B group vitamins: Secondary | ICD-10-CM | POA: Diagnosis not present

## 2015-03-14 DIAGNOSIS — N138 Other obstructive and reflux uropathy: Secondary | ICD-10-CM | POA: Diagnosis not present

## 2015-03-14 DIAGNOSIS — N401 Enlarged prostate with lower urinary tract symptoms: Secondary | ICD-10-CM | POA: Diagnosis not present

## 2015-03-14 DIAGNOSIS — N402 Nodular prostate without lower urinary tract symptoms: Secondary | ICD-10-CM | POA: Diagnosis not present

## 2015-03-18 DIAGNOSIS — E1140 Type 2 diabetes mellitus with diabetic neuropathy, unspecified: Secondary | ICD-10-CM | POA: Diagnosis not present

## 2015-03-18 DIAGNOSIS — G609 Hereditary and idiopathic neuropathy, unspecified: Secondary | ICD-10-CM | POA: Diagnosis not present

## 2015-03-18 DIAGNOSIS — M25551 Pain in right hip: Secondary | ICD-10-CM | POA: Diagnosis not present

## 2015-04-16 DIAGNOSIS — D649 Anemia, unspecified: Secondary | ICD-10-CM | POA: Diagnosis not present

## 2015-05-07 ENCOUNTER — Ambulatory Visit (INDEPENDENT_AMBULATORY_CARE_PROVIDER_SITE_OTHER): Payer: Medicare Other | Admitting: Ophthalmology

## 2015-05-07 DIAGNOSIS — H35033 Hypertensive retinopathy, bilateral: Secondary | ICD-10-CM | POA: Diagnosis not present

## 2015-05-07 DIAGNOSIS — I10 Essential (primary) hypertension: Secondary | ICD-10-CM | POA: Diagnosis not present

## 2015-05-07 DIAGNOSIS — H43813 Vitreous degeneration, bilateral: Secondary | ICD-10-CM | POA: Diagnosis not present

## 2015-05-07 DIAGNOSIS — E11339 Type 2 diabetes mellitus with moderate nonproliferative diabetic retinopathy without macular edema: Secondary | ICD-10-CM

## 2015-05-07 DIAGNOSIS — E11319 Type 2 diabetes mellitus with unspecified diabetic retinopathy without macular edema: Secondary | ICD-10-CM

## 2015-05-28 DIAGNOSIS — E538 Deficiency of other specified B group vitamins: Secondary | ICD-10-CM | POA: Diagnosis not present

## 2015-05-28 DIAGNOSIS — E1142 Type 2 diabetes mellitus with diabetic polyneuropathy: Secondary | ICD-10-CM | POA: Diagnosis not present

## 2015-05-28 DIAGNOSIS — E11329 Type 2 diabetes mellitus with mild nonproliferative diabetic retinopathy without macular edema: Secondary | ICD-10-CM | POA: Diagnosis not present

## 2015-05-28 DIAGNOSIS — E785 Hyperlipidemia, unspecified: Secondary | ICD-10-CM | POA: Diagnosis not present

## 2015-05-28 DIAGNOSIS — E08311 Diabetes mellitus due to underlying condition with unspecified diabetic retinopathy with macular edema: Secondary | ICD-10-CM | POA: Diagnosis not present

## 2015-05-28 DIAGNOSIS — I10 Essential (primary) hypertension: Secondary | ICD-10-CM | POA: Diagnosis not present

## 2015-05-28 DIAGNOSIS — D649 Anemia, unspecified: Secondary | ICD-10-CM | POA: Diagnosis not present

## 2015-06-17 DIAGNOSIS — Z85828 Personal history of other malignant neoplasm of skin: Secondary | ICD-10-CM | POA: Diagnosis not present

## 2015-06-17 DIAGNOSIS — B354 Tinea corporis: Secondary | ICD-10-CM | POA: Diagnosis not present

## 2015-06-20 DIAGNOSIS — H01114 Allergic dermatitis of left upper eyelid: Secondary | ICD-10-CM | POA: Diagnosis not present

## 2015-06-20 DIAGNOSIS — H01115 Allergic dermatitis of left lower eyelid: Secondary | ICD-10-CM | POA: Diagnosis not present

## 2015-07-01 DIAGNOSIS — E538 Deficiency of other specified B group vitamins: Secondary | ICD-10-CM | POA: Diagnosis not present

## 2015-07-01 DIAGNOSIS — H01115 Allergic dermatitis of left lower eyelid: Secondary | ICD-10-CM | POA: Diagnosis not present

## 2015-07-01 DIAGNOSIS — H01114 Allergic dermatitis of left upper eyelid: Secondary | ICD-10-CM | POA: Diagnosis not present

## 2015-07-03 DIAGNOSIS — M65332 Trigger finger, left middle finger: Secondary | ICD-10-CM | POA: Diagnosis not present

## 2015-07-03 DIAGNOSIS — M151 Heberden's nodes (with arthropathy): Secondary | ICD-10-CM | POA: Diagnosis not present

## 2015-07-31 DIAGNOSIS — M65332 Trigger finger, left middle finger: Secondary | ICD-10-CM | POA: Diagnosis not present

## 2015-07-31 DIAGNOSIS — M151 Heberden's nodes (with arthropathy): Secondary | ICD-10-CM | POA: Diagnosis not present

## 2015-08-02 DIAGNOSIS — E538 Deficiency of other specified B group vitamins: Secondary | ICD-10-CM | POA: Diagnosis not present

## 2015-08-12 DIAGNOSIS — M25562 Pain in left knee: Secondary | ICD-10-CM | POA: Diagnosis not present

## 2015-08-13 DIAGNOSIS — H401133 Primary open-angle glaucoma, bilateral, severe stage: Secondary | ICD-10-CM | POA: Diagnosis not present

## 2015-08-13 DIAGNOSIS — H04123 Dry eye syndrome of bilateral lacrimal glands: Secondary | ICD-10-CM | POA: Diagnosis not present

## 2015-08-14 IMAGING — CT CT ABDOMEN W/ CM
2 of 5 series · 17 of 46 positions shown, 19 images · IV contrast (RECTAL CM & [ID] OMNI 300)
Comparison: 03/15/2012 and 07/28/2011

CLINICAL DATA: Left upper quadrant abdominal pain for 10 days.
Fall. Query splenic injury.

EXAM:
CT ABDOMEN WITH CONTRAST
TECHNIQUE: Multidetector CT imaging of the abdomen was performed using the
standard protocol following bolus administration of intravenous
contrast.
CONTRAST:  100mL OMNIPAQUE IOHEXOL 300 MG/ML  SOLN

[Series 2: abdomen w/ · axial · 0.70mm/px · z∈[-279,-24]mm · 14 of 59 slices shown, 16 images]
[im 4/59  soft-tissue]
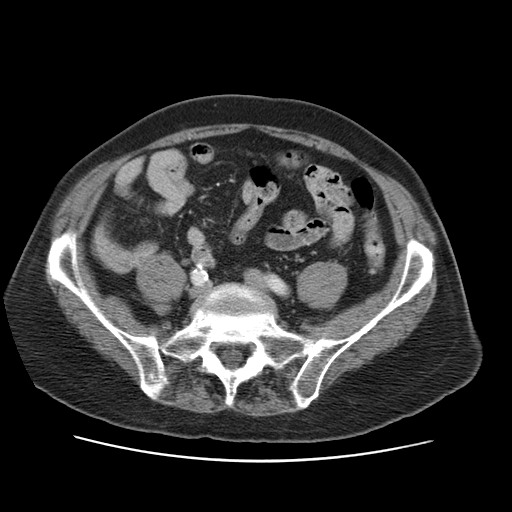
[im 4/59  bone]
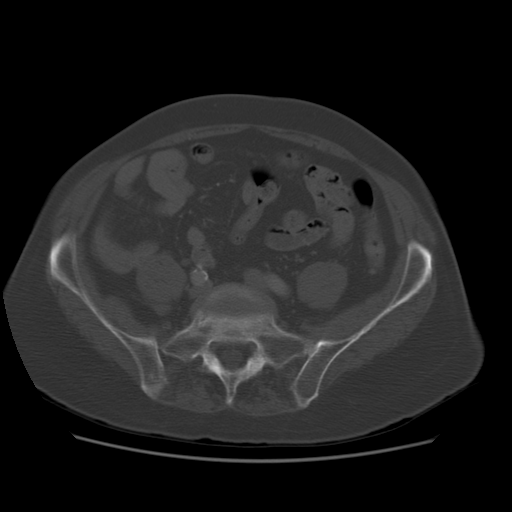
[im 8/59  soft-tissue]
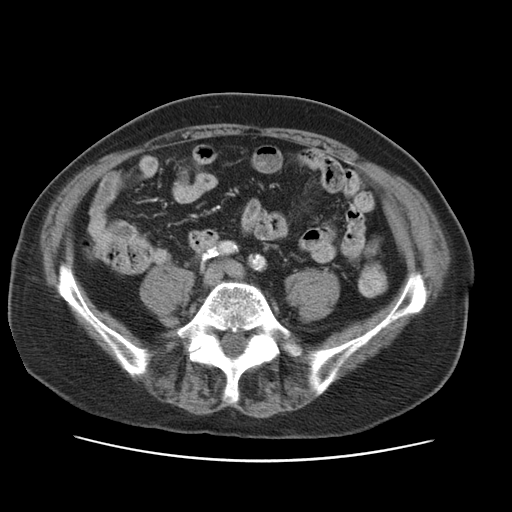
[im 12/59  soft-tissue]
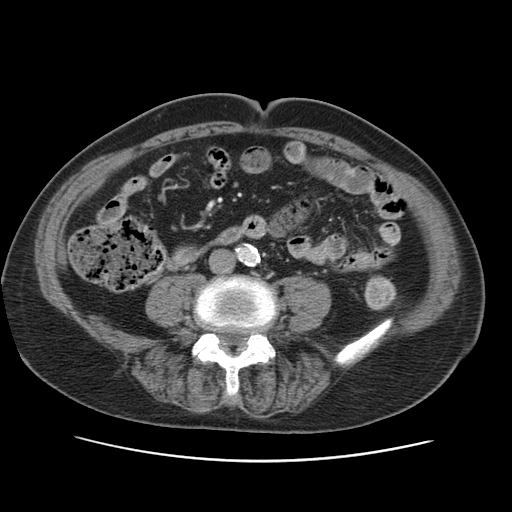
[im 16/59  soft-tissue]
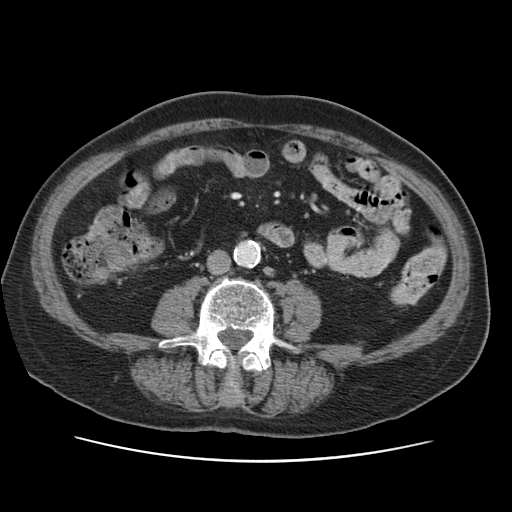
[im 20/59  soft-tissue]
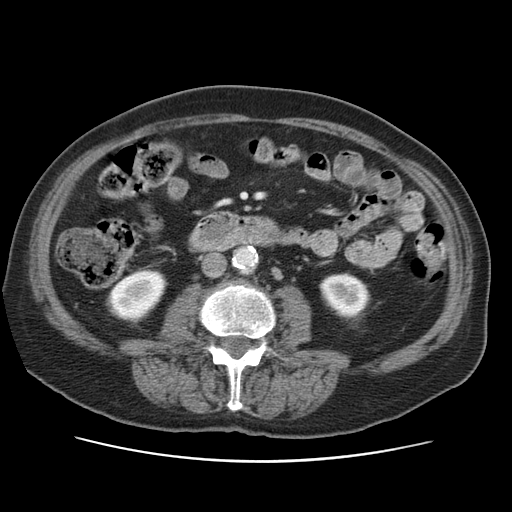
[im 24/59  soft-tissue]
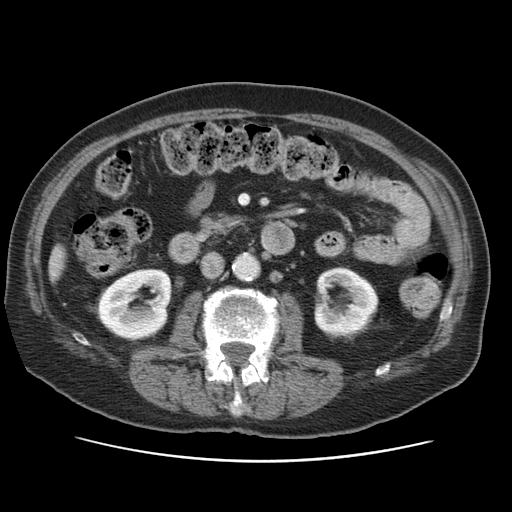
[im 28/59  soft-tissue]
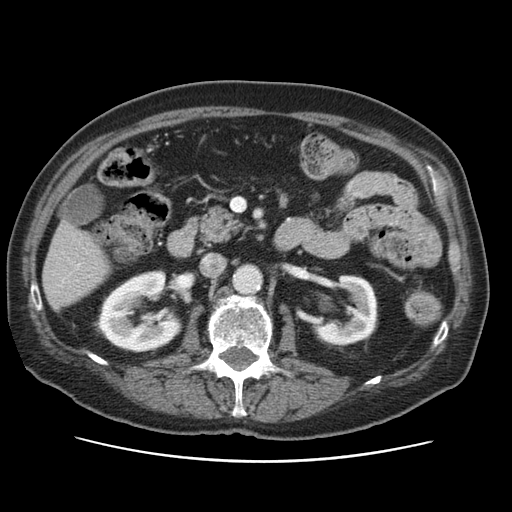
[im 31/59  soft-tissue]
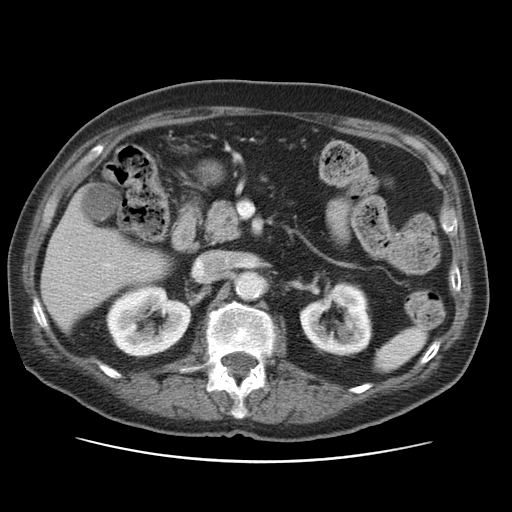
[im 35/59  soft-tissue]
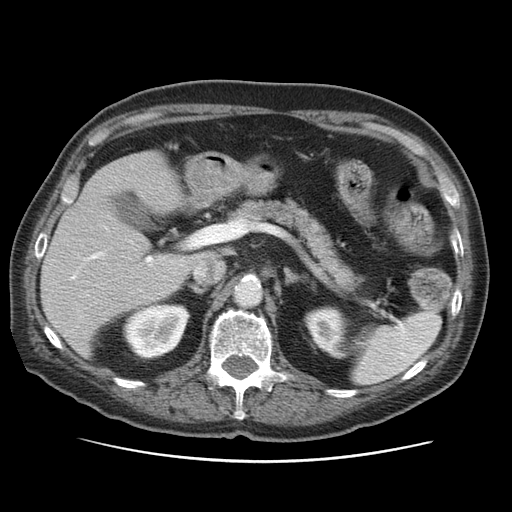
[im 35/59  bone]
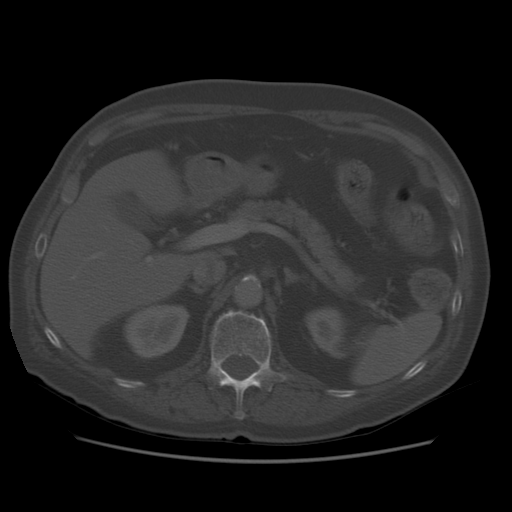
[im 39/59  soft-tissue]
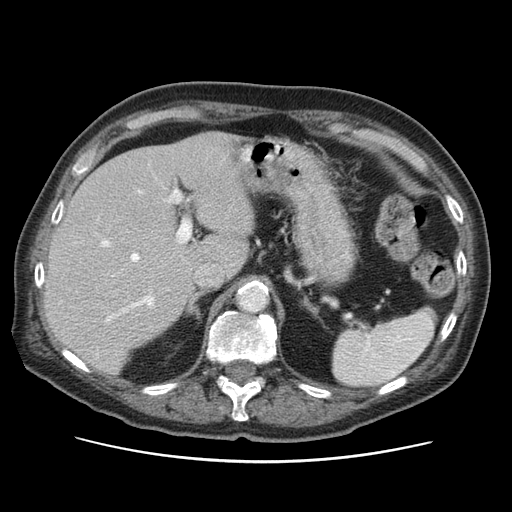
[im 43/59  soft-tissue]
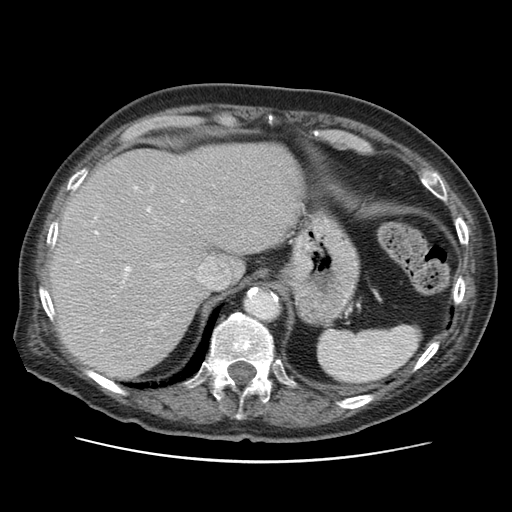
[im 47/59  soft-tissue]
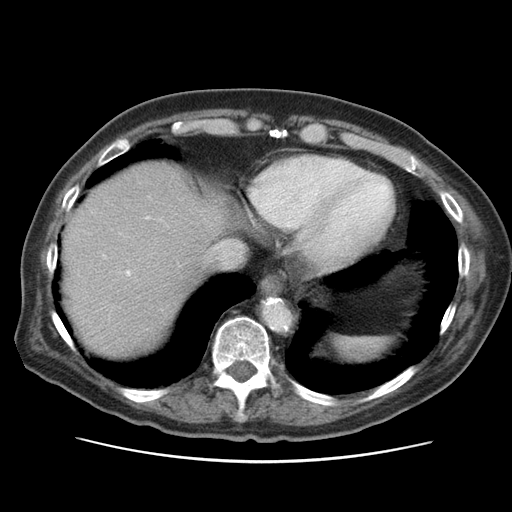
[im 51/59  soft-tissue]
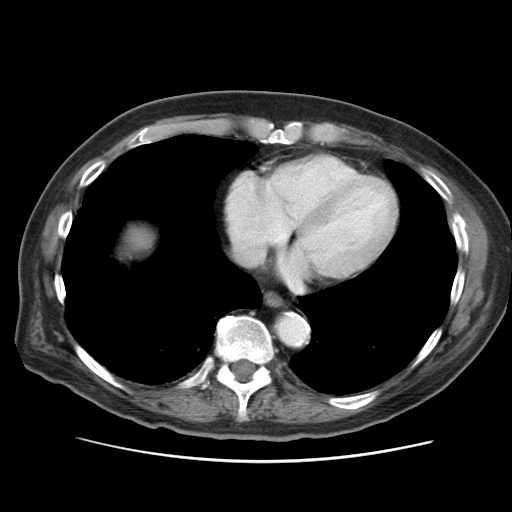
[im 55/59  soft-tissue]
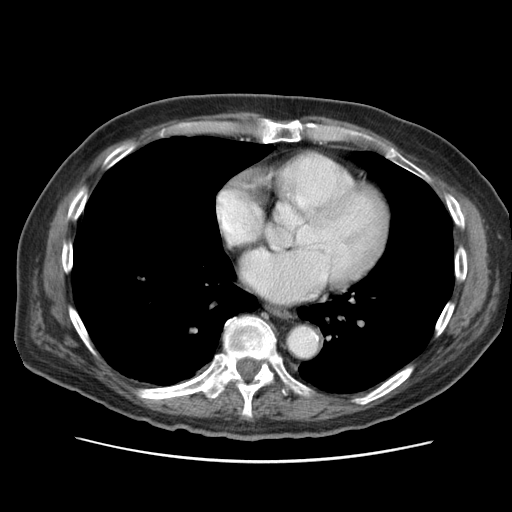

[Series 400: cor · coronal · 0.70mm/px · 3 of 130 slices shown]
[im 44/130  soft-tissue]
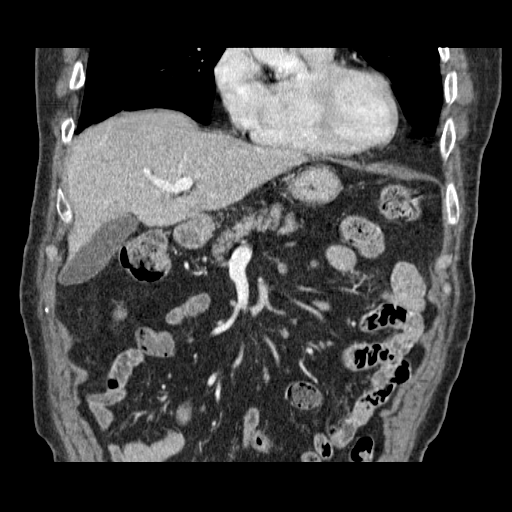
[im 58/130  soft-tissue]
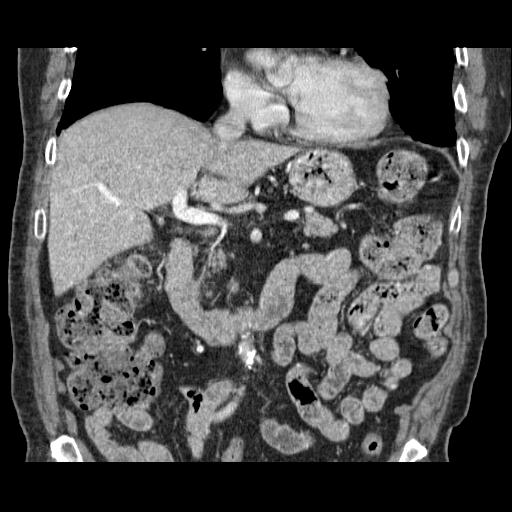
[im 72/130  soft-tissue]
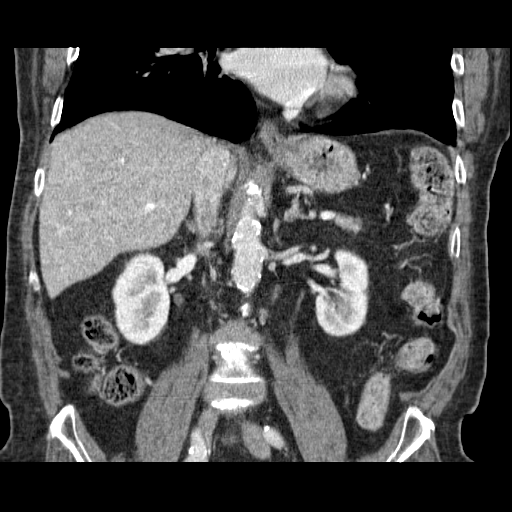

[17 of 46 positions shown; findings below may reference images not displayed]

FINDINGS: Coronary atherosclerotic calcification.

Mild hepatic steatosis. Accounting for motion artifact, was planned
appears normal, without visible laceration or perisplenic fluid.
However, the patient does have an acute mildly displaced fracture of
the left seventh rib anterolaterally and nondisplaced fractures of
the sixth and eighth rib ribs. No signs of a basilar pneumothorax.
No pleural effusion on the left.

The adrenal glands, gallbladder, and pancreas appear normal.
Aortoiliac atherosclerotic vascular disease. Kidneys and proximal
ureters unremarkable. Small retroperitoneal lymph nodes are not
pathologically enlarged by size criteria.

Prominent stool throughout the colon favors constipation. Appendix
not well seen. Lower lumbar disc bulges noted without critical
impingement.
IMPRESSION: 1. Acute displaced fracture of the left seventh rib anterolaterally.
Nondisplaced fractures of the left sixth and eighth ribs in adjacent
locations.
2. The spleen appears unremarkable, accounting for the degree of
motion artifact. No perisplenic ascites to suggest hemorrhage from
the spleen.
3. Atherosclerosis.
4.  Prominent stool throughout the colon favors constipation.
5. Mild hepatic steatosis.

## 2015-09-02 DIAGNOSIS — E538 Deficiency of other specified B group vitamins: Secondary | ICD-10-CM | POA: Diagnosis not present

## 2015-09-23 DIAGNOSIS — H401133 Primary open-angle glaucoma, bilateral, severe stage: Secondary | ICD-10-CM | POA: Diagnosis not present

## 2015-09-27 DIAGNOSIS — Z0000 Encounter for general adult medical examination without abnormal findings: Secondary | ICD-10-CM | POA: Diagnosis not present

## 2015-09-27 DIAGNOSIS — I700 Atherosclerosis of aorta: Secondary | ICD-10-CM | POA: Diagnosis not present

## 2015-09-27 DIAGNOSIS — E785 Hyperlipidemia, unspecified: Secondary | ICD-10-CM | POA: Diagnosis not present

## 2015-09-27 DIAGNOSIS — E113211 Type 2 diabetes mellitus with mild nonproliferative diabetic retinopathy with macular edema, right eye: Secondary | ICD-10-CM | POA: Diagnosis not present

## 2015-09-27 DIAGNOSIS — Z7984 Long term (current) use of oral hypoglycemic drugs: Secondary | ICD-10-CM | POA: Diagnosis not present

## 2015-09-27 DIAGNOSIS — D519 Vitamin B12 deficiency anemia, unspecified: Secondary | ICD-10-CM | POA: Diagnosis not present

## 2015-09-27 DIAGNOSIS — I10 Essential (primary) hypertension: Secondary | ICD-10-CM | POA: Diagnosis not present

## 2015-09-27 DIAGNOSIS — E1140 Type 2 diabetes mellitus with diabetic neuropathy, unspecified: Secondary | ICD-10-CM | POA: Diagnosis not present

## 2015-09-27 DIAGNOSIS — Z1389 Encounter for screening for other disorder: Secondary | ICD-10-CM | POA: Diagnosis not present

## 2015-09-27 DIAGNOSIS — Z23 Encounter for immunization: Secondary | ICD-10-CM | POA: Diagnosis not present

## 2015-09-27 DIAGNOSIS — E119 Type 2 diabetes mellitus without complications: Secondary | ICD-10-CM | POA: Diagnosis not present

## 2015-09-27 DIAGNOSIS — E538 Deficiency of other specified B group vitamins: Secondary | ICD-10-CM | POA: Diagnosis not present

## 2015-10-07 DIAGNOSIS — E538 Deficiency of other specified B group vitamins: Secondary | ICD-10-CM | POA: Diagnosis not present

## 2015-10-21 DIAGNOSIS — H401133 Primary open-angle glaucoma, bilateral, severe stage: Secondary | ICD-10-CM | POA: Diagnosis not present

## 2015-11-07 DIAGNOSIS — E538 Deficiency of other specified B group vitamins: Secondary | ICD-10-CM | POA: Diagnosis not present

## 2015-11-19 ENCOUNTER — Ambulatory Visit (INDEPENDENT_AMBULATORY_CARE_PROVIDER_SITE_OTHER): Payer: Medicare Other | Admitting: Ophthalmology

## 2015-11-22 ENCOUNTER — Ambulatory Visit (INDEPENDENT_AMBULATORY_CARE_PROVIDER_SITE_OTHER): Payer: Medicare Other | Admitting: Ophthalmology

## 2015-11-22 DIAGNOSIS — E11311 Type 2 diabetes mellitus with unspecified diabetic retinopathy with macular edema: Secondary | ICD-10-CM

## 2015-11-22 DIAGNOSIS — I10 Essential (primary) hypertension: Secondary | ICD-10-CM | POA: Diagnosis not present

## 2015-11-22 DIAGNOSIS — H43813 Vitreous degeneration, bilateral: Secondary | ICD-10-CM

## 2015-11-22 DIAGNOSIS — E113313 Type 2 diabetes mellitus with moderate nonproliferative diabetic retinopathy with macular edema, bilateral: Secondary | ICD-10-CM

## 2015-11-22 DIAGNOSIS — H35033 Hypertensive retinopathy, bilateral: Secondary | ICD-10-CM | POA: Diagnosis not present

## 2015-12-09 DIAGNOSIS — E538 Deficiency of other specified B group vitamins: Secondary | ICD-10-CM | POA: Diagnosis not present

## 2016-01-07 DIAGNOSIS — Z85828 Personal history of other malignant neoplasm of skin: Secondary | ICD-10-CM | POA: Diagnosis not present

## 2016-01-07 DIAGNOSIS — B354 Tinea corporis: Secondary | ICD-10-CM | POA: Diagnosis not present

## 2016-01-07 DIAGNOSIS — L820 Inflamed seborrheic keratosis: Secondary | ICD-10-CM | POA: Diagnosis not present

## 2016-01-08 DIAGNOSIS — E538 Deficiency of other specified B group vitamins: Secondary | ICD-10-CM | POA: Diagnosis not present

## 2016-01-27 DIAGNOSIS — I10 Essential (primary) hypertension: Secondary | ICD-10-CM | POA: Diagnosis not present

## 2016-01-27 DIAGNOSIS — Z7984 Long term (current) use of oral hypoglycemic drugs: Secondary | ICD-10-CM | POA: Diagnosis not present

## 2016-01-27 DIAGNOSIS — E785 Hyperlipidemia, unspecified: Secondary | ICD-10-CM | POA: Diagnosis not present

## 2016-01-27 DIAGNOSIS — E1140 Type 2 diabetes mellitus with diabetic neuropathy, unspecified: Secondary | ICD-10-CM | POA: Diagnosis not present

## 2016-01-27 DIAGNOSIS — E113299 Type 2 diabetes mellitus with mild nonproliferative diabetic retinopathy without macular edema, unspecified eye: Secondary | ICD-10-CM | POA: Diagnosis not present

## 2016-02-06 DIAGNOSIS — H04123 Dry eye syndrome of bilateral lacrimal glands: Secondary | ICD-10-CM | POA: Diagnosis not present

## 2016-02-06 DIAGNOSIS — H401133 Primary open-angle glaucoma, bilateral, severe stage: Secondary | ICD-10-CM | POA: Diagnosis not present

## 2016-02-07 DIAGNOSIS — M65332 Trigger finger, left middle finger: Secondary | ICD-10-CM | POA: Insufficient documentation

## 2016-02-10 DIAGNOSIS — E538 Deficiency of other specified B group vitamins: Secondary | ICD-10-CM | POA: Diagnosis not present

## 2016-02-17 DIAGNOSIS — L282 Other prurigo: Secondary | ICD-10-CM | POA: Diagnosis not present

## 2016-02-17 DIAGNOSIS — L308 Other specified dermatitis: Secondary | ICD-10-CM | POA: Diagnosis not present

## 2016-02-17 DIAGNOSIS — Z85828 Personal history of other malignant neoplasm of skin: Secondary | ICD-10-CM | POA: Diagnosis not present

## 2016-03-11 DIAGNOSIS — M65332 Trigger finger, left middle finger: Secondary | ICD-10-CM | POA: Diagnosis not present

## 2016-03-12 DIAGNOSIS — E538 Deficiency of other specified B group vitamins: Secondary | ICD-10-CM | POA: Diagnosis not present

## 2016-03-23 DIAGNOSIS — R51 Headache: Secondary | ICD-10-CM | POA: Diagnosis not present

## 2016-03-24 DIAGNOSIS — B354 Tinea corporis: Secondary | ICD-10-CM | POA: Diagnosis not present

## 2016-03-24 DIAGNOSIS — Z85828 Personal history of other malignant neoplasm of skin: Secondary | ICD-10-CM | POA: Diagnosis not present

## 2016-04-13 DIAGNOSIS — E538 Deficiency of other specified B group vitamins: Secondary | ICD-10-CM | POA: Diagnosis not present

## 2016-05-05 DIAGNOSIS — J209 Acute bronchitis, unspecified: Secondary | ICD-10-CM | POA: Diagnosis not present

## 2016-05-14 DIAGNOSIS — E538 Deficiency of other specified B group vitamins: Secondary | ICD-10-CM | POA: Diagnosis not present

## 2016-05-25 ENCOUNTER — Ambulatory Visit (INDEPENDENT_AMBULATORY_CARE_PROVIDER_SITE_OTHER): Payer: Medicare Other | Admitting: Ophthalmology

## 2016-05-25 DIAGNOSIS — H35033 Hypertensive retinopathy, bilateral: Secondary | ICD-10-CM | POA: Diagnosis not present

## 2016-05-25 DIAGNOSIS — H43813 Vitreous degeneration, bilateral: Secondary | ICD-10-CM | POA: Diagnosis not present

## 2016-05-25 DIAGNOSIS — I10 Essential (primary) hypertension: Secondary | ICD-10-CM | POA: Diagnosis not present

## 2016-05-25 DIAGNOSIS — E113393 Type 2 diabetes mellitus with moderate nonproliferative diabetic retinopathy without macular edema, bilateral: Secondary | ICD-10-CM

## 2016-05-25 DIAGNOSIS — E11319 Type 2 diabetes mellitus with unspecified diabetic retinopathy without macular edema: Secondary | ICD-10-CM | POA: Diagnosis not present

## 2016-06-02 DIAGNOSIS — E113291 Type 2 diabetes mellitus with mild nonproliferative diabetic retinopathy without macular edema, right eye: Secondary | ICD-10-CM | POA: Diagnosis not present

## 2016-06-02 DIAGNOSIS — N182 Chronic kidney disease, stage 2 (mild): Secondary | ICD-10-CM | POA: Diagnosis not present

## 2016-06-02 DIAGNOSIS — E1140 Type 2 diabetes mellitus with diabetic neuropathy, unspecified: Secondary | ICD-10-CM | POA: Diagnosis not present

## 2016-06-02 DIAGNOSIS — E785 Hyperlipidemia, unspecified: Secondary | ICD-10-CM | POA: Diagnosis not present

## 2016-06-02 DIAGNOSIS — I700 Atherosclerosis of aorta: Secondary | ICD-10-CM | POA: Diagnosis not present

## 2016-06-02 DIAGNOSIS — Z7984 Long term (current) use of oral hypoglycemic drugs: Secondary | ICD-10-CM | POA: Diagnosis not present

## 2016-06-02 DIAGNOSIS — E538 Deficiency of other specified B group vitamins: Secondary | ICD-10-CM | POA: Diagnosis not present

## 2016-06-04 DIAGNOSIS — H401133 Primary open-angle glaucoma, bilateral, severe stage: Secondary | ICD-10-CM | POA: Diagnosis not present

## 2016-06-04 DIAGNOSIS — H04123 Dry eye syndrome of bilateral lacrimal glands: Secondary | ICD-10-CM | POA: Diagnosis not present

## 2016-06-15 DIAGNOSIS — E538 Deficiency of other specified B group vitamins: Secondary | ICD-10-CM | POA: Diagnosis not present

## 2016-06-17 ENCOUNTER — Ambulatory Visit (INDEPENDENT_AMBULATORY_CARE_PROVIDER_SITE_OTHER): Payer: Medicare Other | Admitting: Podiatry

## 2016-06-17 ENCOUNTER — Encounter: Payer: Self-pay | Admitting: Podiatry

## 2016-06-17 ENCOUNTER — Ambulatory Visit (INDEPENDENT_AMBULATORY_CARE_PROVIDER_SITE_OTHER): Payer: Medicare Other

## 2016-06-17 VITALS — BP 162/100 | HR 66 | Resp 16 | Ht 67.0 in | Wt 162.0 lb

## 2016-06-17 DIAGNOSIS — M722 Plantar fascial fibromatosis: Secondary | ICD-10-CM | POA: Diagnosis not present

## 2016-06-17 DIAGNOSIS — M79672 Pain in left foot: Secondary | ICD-10-CM

## 2016-06-17 MED ORDER — TRIAMCINOLONE ACETONIDE 10 MG/ML IJ SUSP
10.0000 mg | Freq: Once | INTRAMUSCULAR | Status: AC
  Administered 2016-06-17: 10 mg

## 2016-06-17 NOTE — Progress Notes (Signed)
Subjective:     Patient ID: Andre Mills, male   DOB: 08-04-1925, 82 y.o.   MRN: [id number]  HPI patient presents stating his heel has been very sore left and it's been hurting him for several months   Review of Systems  All other systems reviewed and are negative.      Objective:   Physical Exam  Constitutional: He is oriented to person, place, and time.  Cardiovascular: Intact distal pulses.   Musculoskeletal: Normal range of motion.  Neurological: He is oriented to person, place, and time.  Skin: Skin is warm.  Nursing note and vitals reviewed.  neurovascular status intact muscle strength adequate range of motion within normal limits with patient found to have severe discomfort plantar aspect left heel at the insertional point of the tendon into the calcaneus. Patient's found to have good digital perfusion is well oriented with mild depression of the arch noted left over right     Assessment:     Acute plantar fasciitis left    Plan:     H&P x-rays reviewed and injected the plantar fascial left 3 mg Kenalog 5 mg Xylocaine and applied fascial brace along with instructions on physical therapy. Reappoint 2 weeks to reevaluate  X-ray report indicate spur formation with no indications of stress fracture or arthritis

## 2016-06-17 NOTE — Progress Notes (Signed)
   Subjective:    Patient ID: Andre Mills, male    DOB: [birth year], 82 y.o.   MRN: [id number]  HPI Chief Complaint  Patient presents with  . Foot Pain    Left foot; heel; pt stated, "foot hurts all the time; pain radiates down foot"; x3 weeks      Review of Systems  Genitourinary: Positive for frequency.  Hematological: Bruises/bleeds easily.  All other systems reviewed and are negative.      Objective:   Physical Exam        Assessment & Plan:

## 2016-06-17 NOTE — Patient Instructions (Signed)

## 2016-07-01 ENCOUNTER — Ambulatory Visit (INDEPENDENT_AMBULATORY_CARE_PROVIDER_SITE_OTHER): Payer: Medicare Other | Admitting: Podiatry

## 2016-07-01 ENCOUNTER — Encounter: Payer: Self-pay | Admitting: Podiatry

## 2016-07-01 DIAGNOSIS — M722 Plantar fascial fibromatosis: Secondary | ICD-10-CM

## 2016-07-01 MED ORDER — TRIAMCINOLONE ACETONIDE 10 MG/ML IJ SUSP
10.0000 mg | Freq: Once | INTRAMUSCULAR | Status: AC
  Administered 2016-07-01: 10 mg

## 2016-07-01 NOTE — Progress Notes (Signed)
Subjective:     Patient ID: Andre Mills, male   DOB: [birth year], 82 y.o.   MRN: [id number]  HPI patient presents stating I'm still having a lot of pain especially when I get up in the morning and after periods of sitting   Review of Systems     Objective:   Physical Exam Neurovascular status intact muscle strength adequate with patient found to have continued intense discomfort in the left plantar fashion at the insertion of the tendon into the calcaneus    Assessment:     Acute plantar fasciitis left with inflammation fluid buildup    Plan:     Reinjected the plantar fascia 3 mg Kenalog 5 mg Xylocaine and dispensed night splint with all instructions on usage at this time

## 2016-07-17 DIAGNOSIS — E538 Deficiency of other specified B group vitamins: Secondary | ICD-10-CM | POA: Diagnosis not present

## 2016-07-23 ENCOUNTER — Encounter: Payer: Self-pay | Admitting: Podiatry

## 2016-07-23 ENCOUNTER — Ambulatory Visit (INDEPENDENT_AMBULATORY_CARE_PROVIDER_SITE_OTHER): Payer: Medicare Other | Admitting: Podiatry

## 2016-07-23 DIAGNOSIS — M722 Plantar fascial fibromatosis: Secondary | ICD-10-CM | POA: Diagnosis not present

## 2016-07-23 MED ORDER — TRIAMCINOLONE ACETONIDE 10 MG/ML IJ SUSP
10.0000 mg | Freq: Once | INTRAMUSCULAR | Status: AC
  Administered 2016-07-23: 10 mg

## 2016-08-07 NOTE — Progress Notes (Signed)
Subjective:     Patient ID: Andre Mills, male   DOB: [birth year], 82 y.o.   MRN: [id number]  HPI patient states my heel is feeling pretty good but there still a spot they get quite sore   Review of Systems     Objective:   Physical Exam Neurovascular status intact with pain in the plantar left heel that is still present in the medial band    Assessment:     Plantar fasciitis left improved but present    Plan:     Reinjected the plantar fascia 3 Milligan Kenalog 5 mill grams Xylocaine advised on physical therapy shoe gear modifications reappoint to recheck

## 2016-08-10 DIAGNOSIS — N401 Enlarged prostate with lower urinary tract symptoms: Secondary | ICD-10-CM | POA: Diagnosis not present

## 2016-08-10 DIAGNOSIS — N402 Nodular prostate without lower urinary tract symptoms: Secondary | ICD-10-CM | POA: Diagnosis not present

## 2016-08-10 DIAGNOSIS — R351 Nocturia: Secondary | ICD-10-CM | POA: Diagnosis not present

## 2016-08-14 ENCOUNTER — Ambulatory Visit (INDEPENDENT_AMBULATORY_CARE_PROVIDER_SITE_OTHER): Payer: Medicare Other | Admitting: Podiatry

## 2016-08-14 ENCOUNTER — Encounter: Payer: Self-pay | Admitting: Podiatry

## 2016-08-14 DIAGNOSIS — M722 Plantar fascial fibromatosis: Secondary | ICD-10-CM

## 2016-08-16 NOTE — Progress Notes (Signed)
Subjective:     Patient ID: Andre Mills, male   DOB: 1925/08/03, 82 y.o.   MRN: [id number]  HPI patient states I'm still having a lot of pain in the bottom of my left heel and I cannot be active do to it   Review of Systems     Objective:   Physical Exam Neurovascular status intact with continued discomfort plantar left heel with negative signs currently within the body of the calcaneus itself    Assessment:     Continued plantar fasciitis left with failure so far to respond conservatively    Plan:     Reviewed condition and recommended complete immobilization and placed him into a air fracture walker to try to take all plantar pressure off the foot. Reappoint in 4 weeks and may require shockwave or other treatment

## 2016-08-24 DIAGNOSIS — E538 Deficiency of other specified B group vitamins: Secondary | ICD-10-CM | POA: Diagnosis not present

## 2016-09-14 ENCOUNTER — Ambulatory Visit (INDEPENDENT_AMBULATORY_CARE_PROVIDER_SITE_OTHER): Payer: Medicare Other | Admitting: Podiatry

## 2016-09-14 ENCOUNTER — Ambulatory Visit: Payer: Medicare Other

## 2016-09-14 ENCOUNTER — Encounter: Payer: Self-pay | Admitting: Podiatry

## 2016-09-14 ENCOUNTER — Ambulatory Visit (INDEPENDENT_AMBULATORY_CARE_PROVIDER_SITE_OTHER): Payer: Medicare Other

## 2016-09-14 DIAGNOSIS — M79672 Pain in left foot: Secondary | ICD-10-CM

## 2016-09-14 DIAGNOSIS — M722 Plantar fascial fibromatosis: Secondary | ICD-10-CM

## 2016-09-14 NOTE — Progress Notes (Signed)
Subjective:     Patient ID: Andre Mills, male   DOB: 01-Oct-1925, 82 y.o.   MRN: [id number]  HPI patient presents stating my left heel is still bothering me a lot in the boot helps slightly but minimal and the pain gets worse as the day goes on. I cannot do the types of activities that I wanted to   Review of Systems     Objective:   Physical Exam Neurovascular status intact muscle strength adequate range of motion within normal limits with patient noted to have continued discomfort in the medial fascial band of the left heel with moderate central band involvement. The calcaneus itself does not appear to be involved but the severity of his discomfort cannot rule out stress fracture    Assessment:     Continued acute plantar fasciitis with at this point no indications of vascular disease and possibility for stress fracture    Plan:     X-ray reviewed and today I recommended shockwave therapy and reviewed shockwave therapy for this condition. Patient wants shockwave therapy and is scheduled to have this done with instructions given Andre Mills today about procedure

## 2016-09-18 ENCOUNTER — Ambulatory Visit (INDEPENDENT_AMBULATORY_CARE_PROVIDER_SITE_OTHER): Payer: Medicare Other | Admitting: Podiatry

## 2016-09-18 DIAGNOSIS — M722 Plantar fascial fibromatosis: Secondary | ICD-10-CM

## 2016-09-18 NOTE — Progress Notes (Signed)
   Subjective:    Patient ID: Andre Mills, male    DOB: 28-Mar-1925, 82 y.o.   MRN: [id number]  HPI Pt presents with pain in hi right  heel ongoing for 3 months   Review of Systems All other systems negative    Objective:   Physical Exam  Pain on palpation to  mid heel region Lt foot      Assessment & Plan:  ESWT administered to mid heel at 17 joules for 3000 pulses. Surrounding connective tissue was treated with EPAT for 3000 pulses. He is to return in 1 week for 2nd treatment. Advised against use iof NSAIDS, advised on boot and night splint usage

## 2016-09-25 ENCOUNTER — Ambulatory Visit: Payer: Self-pay | Admitting: Podiatry

## 2016-09-25 DIAGNOSIS — M722 Plantar fascial fibromatosis: Secondary | ICD-10-CM

## 2016-09-25 DIAGNOSIS — E538 Deficiency of other specified B group vitamins: Secondary | ICD-10-CM | POA: Diagnosis not present

## 2016-09-25 DIAGNOSIS — Z23 Encounter for immunization: Secondary | ICD-10-CM | POA: Diagnosis not present

## 2016-09-29 ENCOUNTER — Ambulatory Visit (INDEPENDENT_AMBULATORY_CARE_PROVIDER_SITE_OTHER): Payer: Medicare Other

## 2016-09-29 DIAGNOSIS — M722 Plantar fascial fibromatosis: Secondary | ICD-10-CM

## 2016-09-29 NOTE — Progress Notes (Signed)
   Subjective:    Patient ID: Andre Mills, male    DOB: [birth year], 82 y.o.   MRN: [id number]  HPI Pt presents with pain in hi right  heel ongoing for 3 months that he believes is improving, the pain is not as severe   Review of Systems All other systems negative    Objective:   Physical Exam  Pain on palpation to  mid heel region Lt foot      Assessment & Plan:  ESWT administered to mid heel at 17 joules for 3000 pulses. Surrounding connective tissue was treated with EPAT for 3000 pulses. He is to return in 4 weeks for 4th treatment. Advised against use iof NSAIDS, advised on boot and night splint usage

## 2016-10-05 NOTE — Progress Notes (Signed)
   Subjective:    Patient ID: Andre Mills, male    DOB: Jan 09, 1925, 82 y.o.   MRN: [id number]  HPI Pt presents with pain in hi right  heel ongoing for 3 months   Review of Systems All other systems negative    Objective:   Physical Exam  Pain on palpation to  mid heel region Lt foot      Assessment & Plan:  ESWT administered to mid heel at 22 joules for 3000 pulses. Surrounding connective tissue was treated with EPAT for 3000 pulses. He is to return in 4 weeks for 4th treatment. Advised against use iof NSAIDS, advised on boot and night splint usage

## 2016-10-16 DIAGNOSIS — G609 Hereditary and idiopathic neuropathy, unspecified: Secondary | ICD-10-CM | POA: Diagnosis not present

## 2016-10-16 DIAGNOSIS — Z7984 Long term (current) use of oral hypoglycemic drugs: Secondary | ICD-10-CM | POA: Diagnosis not present

## 2016-10-16 DIAGNOSIS — E1140 Type 2 diabetes mellitus with diabetic neuropathy, unspecified: Secondary | ICD-10-CM | POA: Diagnosis not present

## 2016-10-16 DIAGNOSIS — I10 Essential (primary) hypertension: Secondary | ICD-10-CM | POA: Diagnosis not present

## 2016-10-16 DIAGNOSIS — Z0000 Encounter for general adult medical examination without abnormal findings: Secondary | ICD-10-CM | POA: Diagnosis not present

## 2016-10-16 DIAGNOSIS — N182 Chronic kidney disease, stage 2 (mild): Secondary | ICD-10-CM | POA: Diagnosis not present

## 2016-10-16 DIAGNOSIS — E785 Hyperlipidemia, unspecified: Secondary | ICD-10-CM | POA: Diagnosis not present

## 2016-10-16 DIAGNOSIS — E538 Deficiency of other specified B group vitamins: Secondary | ICD-10-CM | POA: Diagnosis not present

## 2016-10-16 DIAGNOSIS — K219 Gastro-esophageal reflux disease without esophagitis: Secondary | ICD-10-CM | POA: Diagnosis not present

## 2016-10-16 DIAGNOSIS — J209 Acute bronchitis, unspecified: Secondary | ICD-10-CM | POA: Diagnosis not present

## 2016-10-16 DIAGNOSIS — N400 Enlarged prostate without lower urinary tract symptoms: Secondary | ICD-10-CM | POA: Diagnosis not present

## 2016-10-16 DIAGNOSIS — Z1389 Encounter for screening for other disorder: Secondary | ICD-10-CM | POA: Diagnosis not present

## 2016-10-16 DIAGNOSIS — E11319 Type 2 diabetes mellitus with unspecified diabetic retinopathy without macular edema: Secondary | ICD-10-CM | POA: Diagnosis not present

## 2016-10-16 DIAGNOSIS — Z125 Encounter for screening for malignant neoplasm of prostate: Secondary | ICD-10-CM | POA: Diagnosis not present

## 2016-10-26 DIAGNOSIS — E538 Deficiency of other specified B group vitamins: Secondary | ICD-10-CM | POA: Diagnosis not present

## 2016-10-27 ENCOUNTER — Ambulatory Visit: Payer: Self-pay | Admitting: Podiatry

## 2016-10-27 DIAGNOSIS — M722 Plantar fascial fibromatosis: Secondary | ICD-10-CM

## 2016-10-27 NOTE — Progress Notes (Signed)
   Subjective:    Patient ID: Andre Mills, male    DOB: [birth year], 82 y.o.   MRN: [id number]  HPI Pt presents with pain in hi right  heel ongoing for 3 months that he believes is improving, the pain is not as severe. Patient states that his pain has improved from 8 of 10 to 5 of 10   Review of Systems All other systems negative    Objective:   Physical Exam  Pain on palpation to  mid heel region Lt foot      Assessment & Plan:  ESWT administered to mid heel at 24 joules for 3000 pulses. Surrounding connective tissue was treated with EPAT for 3000 pulses. He is to return in 6 weeks for re-evaluation by Dr Paulla Dolly

## 2016-11-11 ENCOUNTER — Ambulatory Visit
Admission: RE | Admit: 2016-11-11 | Discharge: 2016-11-11 | Disposition: A | Discharge status: Home / Self Care | Payer: Medicare Other | Source: Ambulatory Visit | Attending: Internal Medicine | Admitting: Internal Medicine

## 2016-11-11 ENCOUNTER — Other Ambulatory Visit: Payer: Self-pay | Admitting: Internal Medicine

## 2016-11-11 ENCOUNTER — Telehealth: Payer: Self-pay | Admitting: *Deleted

## 2016-11-11 DIAGNOSIS — S2231XA Fracture of one rib, right side, initial encounter for closed fracture: Secondary | ICD-10-CM | POA: Diagnosis not present

## 2016-11-11 DIAGNOSIS — Z125 Encounter for screening for malignant neoplasm of prostate: Secondary | ICD-10-CM | POA: Diagnosis not present

## 2016-11-11 DIAGNOSIS — Z7984 Long term (current) use of oral hypoglycemic drugs: Secondary | ICD-10-CM | POA: Diagnosis not present

## 2016-11-11 DIAGNOSIS — R0781 Pleurodynia: Secondary | ICD-10-CM

## 2016-11-11 DIAGNOSIS — S299XXA Unspecified injury of thorax, initial encounter: Secondary | ICD-10-CM | POA: Diagnosis not present

## 2016-11-11 DIAGNOSIS — W19XXXA Unspecified fall, initial encounter: Secondary | ICD-10-CM | POA: Diagnosis not present

## 2016-11-11 DIAGNOSIS — I10 Essential (primary) hypertension: Secondary | ICD-10-CM | POA: Diagnosis not present

## 2016-11-11 DIAGNOSIS — E1140 Type 2 diabetes mellitus with diabetic neuropathy, unspecified: Secondary | ICD-10-CM | POA: Diagnosis not present

## 2016-11-11 DIAGNOSIS — R079 Chest pain, unspecified: Secondary | ICD-10-CM | POA: Diagnosis not present

## 2016-11-11 DIAGNOSIS — E538 Deficiency of other specified B group vitamins: Secondary | ICD-10-CM | POA: Diagnosis not present

## 2016-11-11 DIAGNOSIS — R2681 Unsteadiness on feet: Secondary | ICD-10-CM | POA: Diagnosis not present

## 2016-11-11 NOTE — Telephone Encounter (Signed)
Pt called left name, DOB and phone. 11/11/2016-I spoke with pt and he states he took the EPAT. Pt states he fell Monday and wanted to know if he could take a tylenol. I told pt that he could take tylenol, but if he was still having pain since Monday he needed to go to his primary doctor. I asked pt where he was hurting and he said on his chest and I told pt he needed to definitely go to the doctor. Pt states understanding.

## 2016-11-27 ENCOUNTER — Ambulatory Visit (INDEPENDENT_AMBULATORY_CARE_PROVIDER_SITE_OTHER): Payer: Medicare Other | Admitting: Ophthalmology

## 2016-11-27 DIAGNOSIS — H35033 Hypertensive retinopathy, bilateral: Secondary | ICD-10-CM | POA: Diagnosis not present

## 2016-11-27 DIAGNOSIS — I10 Essential (primary) hypertension: Secondary | ICD-10-CM

## 2016-11-27 DIAGNOSIS — H43813 Vitreous degeneration, bilateral: Secondary | ICD-10-CM

## 2016-11-27 DIAGNOSIS — E113312 Type 2 diabetes mellitus with moderate nonproliferative diabetic retinopathy with macular edema, left eye: Secondary | ICD-10-CM

## 2016-11-27 DIAGNOSIS — E113391 Type 2 diabetes mellitus with moderate nonproliferative diabetic retinopathy without macular edema, right eye: Secondary | ICD-10-CM

## 2016-11-27 DIAGNOSIS — E11311 Type 2 diabetes mellitus with unspecified diabetic retinopathy with macular edema: Secondary | ICD-10-CM | POA: Diagnosis not present

## 2016-11-30 DIAGNOSIS — E538 Deficiency of other specified B group vitamins: Secondary | ICD-10-CM | POA: Diagnosis not present

## 2016-12-07 ENCOUNTER — Ambulatory Visit (INDEPENDENT_AMBULATORY_CARE_PROVIDER_SITE_OTHER): Payer: Medicare Other | Admitting: Podiatry

## 2016-12-07 ENCOUNTER — Encounter: Payer: Self-pay | Admitting: Podiatry

## 2016-12-07 DIAGNOSIS — M722 Plantar fascial fibromatosis: Secondary | ICD-10-CM | POA: Diagnosis not present

## 2016-12-09 NOTE — Progress Notes (Signed)
Subjective:     Patient ID: Andre Mills, male   DOB: 1924-12-25, 83 y.o.   MRN: [id number]  HPI patient states that he is doing much better with reduce discomfort noted   Review of Systems     Objective:   Physical Exam Neurovascular status unchanged with patient's plantar heel pain quite improved with mild discomfort upon deep palpation but significant improvement from previous visit    Assessment:     Plantar fasciitis improved with shockwave    Plan:     Reviewed condition and recommended that we go ahead and continue with conservative stretching exercises shoe gear modifications over-the-counter inserts and may require shockwave in future depending on symptoms and patient will resume normal activities

## 2016-12-11 DIAGNOSIS — E538 Deficiency of other specified B group vitamins: Secondary | ICD-10-CM | POA: Diagnosis not present

## 2016-12-11 DIAGNOSIS — Z7984 Long term (current) use of oral hypoglycemic drugs: Secondary | ICD-10-CM | POA: Diagnosis not present

## 2016-12-11 DIAGNOSIS — E785 Hyperlipidemia, unspecified: Secondary | ICD-10-CM | POA: Diagnosis not present

## 2016-12-11 DIAGNOSIS — N182 Chronic kidney disease, stage 2 (mild): Secondary | ICD-10-CM | POA: Diagnosis not present

## 2016-12-11 DIAGNOSIS — E113299 Type 2 diabetes mellitus with mild nonproliferative diabetic retinopathy without macular edema, unspecified eye: Secondary | ICD-10-CM | POA: Diagnosis not present

## 2016-12-11 DIAGNOSIS — R2681 Unsteadiness on feet: Secondary | ICD-10-CM | POA: Diagnosis not present

## 2016-12-11 DIAGNOSIS — S2239XA Fracture of one rib, unspecified side, initial encounter for closed fracture: Secondary | ICD-10-CM | POA: Diagnosis not present

## 2016-12-11 DIAGNOSIS — E1140 Type 2 diabetes mellitus with diabetic neuropathy, unspecified: Secondary | ICD-10-CM | POA: Diagnosis not present

## 2016-12-17 DIAGNOSIS — H401133 Primary open-angle glaucoma, bilateral, severe stage: Secondary | ICD-10-CM | POA: Diagnosis not present

## 2016-12-17 DIAGNOSIS — H04123 Dry eye syndrome of bilateral lacrimal glands: Secondary | ICD-10-CM | POA: Diagnosis not present

## 2016-12-17 DIAGNOSIS — Z961 Presence of intraocular lens: Secondary | ICD-10-CM | POA: Diagnosis not present

## 2016-12-23 ENCOUNTER — Encounter (INDEPENDENT_AMBULATORY_CARE_PROVIDER_SITE_OTHER): Payer: Medicare Other | Admitting: Ophthalmology

## 2016-12-23 DIAGNOSIS — H35033 Hypertensive retinopathy, bilateral: Secondary | ICD-10-CM

## 2016-12-23 DIAGNOSIS — E113391 Type 2 diabetes mellitus with moderate nonproliferative diabetic retinopathy without macular edema, right eye: Secondary | ICD-10-CM

## 2016-12-23 DIAGNOSIS — I10 Essential (primary) hypertension: Secondary | ICD-10-CM | POA: Diagnosis not present

## 2016-12-23 DIAGNOSIS — E11311 Type 2 diabetes mellitus with unspecified diabetic retinopathy with macular edema: Secondary | ICD-10-CM | POA: Diagnosis not present

## 2016-12-23 DIAGNOSIS — H43813 Vitreous degeneration, bilateral: Secondary | ICD-10-CM | POA: Diagnosis not present

## 2016-12-23 DIAGNOSIS — E113312 Type 2 diabetes mellitus with moderate nonproliferative diabetic retinopathy with macular edema, left eye: Secondary | ICD-10-CM | POA: Diagnosis not present

## 2017-01-01 DIAGNOSIS — E538 Deficiency of other specified B group vitamins: Secondary | ICD-10-CM | POA: Diagnosis not present

## 2017-01-04 DIAGNOSIS — B354 Tinea corporis: Secondary | ICD-10-CM | POA: Diagnosis not present

## 2017-01-04 DIAGNOSIS — Z85828 Personal history of other malignant neoplasm of skin: Secondary | ICD-10-CM | POA: Diagnosis not present

## 2017-01-20 ENCOUNTER — Encounter (INDEPENDENT_AMBULATORY_CARE_PROVIDER_SITE_OTHER): Payer: Medicare Other | Admitting: Ophthalmology

## 2017-01-22 ENCOUNTER — Encounter (INDEPENDENT_AMBULATORY_CARE_PROVIDER_SITE_OTHER): Payer: Medicare Other | Admitting: Ophthalmology

## 2017-01-22 DIAGNOSIS — E113313 Type 2 diabetes mellitus with moderate nonproliferative diabetic retinopathy with macular edema, bilateral: Secondary | ICD-10-CM | POA: Diagnosis not present

## 2017-01-22 DIAGNOSIS — E11311 Type 2 diabetes mellitus with unspecified diabetic retinopathy with macular edema: Secondary | ICD-10-CM | POA: Diagnosis not present

## 2017-01-22 DIAGNOSIS — H35033 Hypertensive retinopathy, bilateral: Secondary | ICD-10-CM

## 2017-01-22 DIAGNOSIS — H43813 Vitreous degeneration, bilateral: Secondary | ICD-10-CM | POA: Diagnosis not present

## 2017-01-22 DIAGNOSIS — I10 Essential (primary) hypertension: Secondary | ICD-10-CM | POA: Diagnosis not present

## 2017-01-29 DIAGNOSIS — G44209 Tension-type headache, unspecified, not intractable: Secondary | ICD-10-CM | POA: Diagnosis not present

## 2017-01-29 DIAGNOSIS — I10 Essential (primary) hypertension: Secondary | ICD-10-CM | POA: Diagnosis not present

## 2017-01-29 DIAGNOSIS — E11319 Type 2 diabetes mellitus with unspecified diabetic retinopathy without macular edema: Secondary | ICD-10-CM | POA: Diagnosis not present

## 2017-01-29 DIAGNOSIS — Z7984 Long term (current) use of oral hypoglycemic drugs: Secondary | ICD-10-CM | POA: Diagnosis not present

## 2017-02-01 DIAGNOSIS — E538 Deficiency of other specified B group vitamins: Secondary | ICD-10-CM | POA: Diagnosis not present

## 2017-02-11 DIAGNOSIS — H6122 Impacted cerumen, left ear: Secondary | ICD-10-CM | POA: Diagnosis not present

## 2017-02-11 DIAGNOSIS — H6692 Otitis media, unspecified, left ear: Secondary | ICD-10-CM | POA: Diagnosis not present

## 2017-02-11 DIAGNOSIS — H6062 Unspecified chronic otitis externa, left ear: Secondary | ICD-10-CM | POA: Diagnosis not present

## 2017-02-15 DIAGNOSIS — Z7984 Long term (current) use of oral hypoglycemic drugs: Secondary | ICD-10-CM | POA: Diagnosis not present

## 2017-02-15 DIAGNOSIS — E785 Hyperlipidemia, unspecified: Secondary | ICD-10-CM | POA: Diagnosis not present

## 2017-02-15 DIAGNOSIS — E1165 Type 2 diabetes mellitus with hyperglycemia: Secondary | ICD-10-CM | POA: Diagnosis not present

## 2017-02-15 DIAGNOSIS — K219 Gastro-esophageal reflux disease without esophagitis: Secondary | ICD-10-CM | POA: Diagnosis not present

## 2017-02-15 DIAGNOSIS — G44209 Tension-type headache, unspecified, not intractable: Secondary | ICD-10-CM | POA: Diagnosis not present

## 2017-02-15 DIAGNOSIS — E1140 Type 2 diabetes mellitus with diabetic neuropathy, unspecified: Secondary | ICD-10-CM | POA: Diagnosis not present

## 2017-02-15 DIAGNOSIS — I700 Atherosclerosis of aorta: Secondary | ICD-10-CM | POA: Diagnosis not present

## 2017-02-18 DIAGNOSIS — H6122 Impacted cerumen, left ear: Secondary | ICD-10-CM | POA: Diagnosis not present

## 2017-02-18 DIAGNOSIS — H6062 Unspecified chronic otitis externa, left ear: Secondary | ICD-10-CM | POA: Diagnosis not present

## 2017-03-05 ENCOUNTER — Encounter (INDEPENDENT_AMBULATORY_CARE_PROVIDER_SITE_OTHER): Payer: Medicare Other | Admitting: Ophthalmology

## 2017-03-05 DIAGNOSIS — H43813 Vitreous degeneration, bilateral: Secondary | ICD-10-CM

## 2017-03-05 DIAGNOSIS — H35033 Hypertensive retinopathy, bilateral: Secondary | ICD-10-CM

## 2017-03-05 DIAGNOSIS — E113313 Type 2 diabetes mellitus with moderate nonproliferative diabetic retinopathy with macular edema, bilateral: Secondary | ICD-10-CM

## 2017-03-05 DIAGNOSIS — I10 Essential (primary) hypertension: Secondary | ICD-10-CM | POA: Diagnosis not present

## 2017-03-05 DIAGNOSIS — E11311 Type 2 diabetes mellitus with unspecified diabetic retinopathy with macular edema: Secondary | ICD-10-CM | POA: Diagnosis not present

## 2017-03-05 DIAGNOSIS — E538 Deficiency of other specified B group vitamins: Secondary | ICD-10-CM | POA: Diagnosis not present

## 2017-04-06 DIAGNOSIS — E538 Deficiency of other specified B group vitamins: Secondary | ICD-10-CM | POA: Diagnosis not present

## 2017-04-15 DIAGNOSIS — H04123 Dry eye syndrome of bilateral lacrimal glands: Secondary | ICD-10-CM | POA: Diagnosis not present

## 2017-04-15 DIAGNOSIS — H401133 Primary open-angle glaucoma, bilateral, severe stage: Secondary | ICD-10-CM | POA: Diagnosis not present

## 2017-04-15 DIAGNOSIS — E113313 Type 2 diabetes mellitus with moderate nonproliferative diabetic retinopathy with macular edema, bilateral: Secondary | ICD-10-CM | POA: Diagnosis not present

## 2017-04-15 DIAGNOSIS — Z961 Presence of intraocular lens: Secondary | ICD-10-CM | POA: Diagnosis not present

## 2017-04-16 ENCOUNTER — Encounter (INDEPENDENT_AMBULATORY_CARE_PROVIDER_SITE_OTHER): Payer: Medicare Other | Admitting: Ophthalmology

## 2017-04-16 DIAGNOSIS — H43813 Vitreous degeneration, bilateral: Secondary | ICD-10-CM | POA: Diagnosis not present

## 2017-04-16 DIAGNOSIS — E113313 Type 2 diabetes mellitus with moderate nonproliferative diabetic retinopathy with macular edema, bilateral: Secondary | ICD-10-CM | POA: Diagnosis not present

## 2017-04-16 DIAGNOSIS — I10 Essential (primary) hypertension: Secondary | ICD-10-CM

## 2017-04-16 DIAGNOSIS — H35033 Hypertensive retinopathy, bilateral: Secondary | ICD-10-CM

## 2017-04-16 DIAGNOSIS — E11311 Type 2 diabetes mellitus with unspecified diabetic retinopathy with macular edema: Secondary | ICD-10-CM | POA: Diagnosis not present

## 2017-04-26 ENCOUNTER — Ambulatory Visit (INDEPENDENT_AMBULATORY_CARE_PROVIDER_SITE_OTHER): Payer: Medicare Other | Admitting: Podiatry

## 2017-04-26 ENCOUNTER — Encounter: Payer: Self-pay | Admitting: Podiatry

## 2017-04-26 DIAGNOSIS — L600 Ingrowing nail: Secondary | ICD-10-CM

## 2017-04-28 NOTE — Progress Notes (Signed)
Subjective:    Patient ID: Andre Mills, male   DOB: 83 y.o.   MRN: [id number]   HPI patient presents stating the left big toenail is partially coming off and he is concerned    ROS      Objective:  Physical Exam neurovascular status intact with patient's left hallux nail been traumatized and thick with partial loss of the nailbed     Assessment:     Traumatized left hallux with partial loss of nail bed    Plan:    Reviewed condition and recommended soaks Band-Aid therapy and wider shoes and this should not be uneventful but if any redness or drainage should occur he is to reappoint immediately

## 2017-05-10 DIAGNOSIS — E538 Deficiency of other specified B group vitamins: Secondary | ICD-10-CM | POA: Diagnosis not present

## 2017-06-11 DIAGNOSIS — E538 Deficiency of other specified B group vitamins: Secondary | ICD-10-CM | POA: Diagnosis not present

## 2017-06-21 DIAGNOSIS — I10 Essential (primary) hypertension: Secondary | ICD-10-CM | POA: Diagnosis not present

## 2017-06-21 DIAGNOSIS — E1140 Type 2 diabetes mellitus with diabetic neuropathy, unspecified: Secondary | ICD-10-CM | POA: Diagnosis not present

## 2017-06-21 DIAGNOSIS — R0789 Other chest pain: Secondary | ICD-10-CM | POA: Diagnosis not present

## 2017-06-21 DIAGNOSIS — E785 Hyperlipidemia, unspecified: Secondary | ICD-10-CM | POA: Diagnosis not present

## 2017-06-25 ENCOUNTER — Encounter (INDEPENDENT_AMBULATORY_CARE_PROVIDER_SITE_OTHER): Payer: Medicare Other | Admitting: Ophthalmology

## 2017-06-25 DIAGNOSIS — H43813 Vitreous degeneration, bilateral: Secondary | ICD-10-CM

## 2017-06-25 DIAGNOSIS — E11311 Type 2 diabetes mellitus with unspecified diabetic retinopathy with macular edema: Secondary | ICD-10-CM | POA: Diagnosis not present

## 2017-06-25 DIAGNOSIS — E113313 Type 2 diabetes mellitus with moderate nonproliferative diabetic retinopathy with macular edema, bilateral: Secondary | ICD-10-CM | POA: Diagnosis not present

## 2017-06-25 DIAGNOSIS — H35033 Hypertensive retinopathy, bilateral: Secondary | ICD-10-CM | POA: Diagnosis not present

## 2017-06-25 DIAGNOSIS — I10 Essential (primary) hypertension: Secondary | ICD-10-CM

## 2017-07-13 DIAGNOSIS — E538 Deficiency of other specified B group vitamins: Secondary | ICD-10-CM | POA: Diagnosis not present

## 2017-08-11 DIAGNOSIS — R2681 Unsteadiness on feet: Secondary | ICD-10-CM | POA: Diagnosis not present

## 2017-08-11 DIAGNOSIS — H6980 Other specified disorders of Eustachian tube, unspecified ear: Secondary | ICD-10-CM | POA: Diagnosis not present

## 2017-08-11 DIAGNOSIS — I10 Essential (primary) hypertension: Secondary | ICD-10-CM | POA: Diagnosis not present

## 2017-08-11 DIAGNOSIS — Z7984 Long term (current) use of oral hypoglycemic drugs: Secondary | ICD-10-CM | POA: Diagnosis not present

## 2017-08-11 DIAGNOSIS — E538 Deficiency of other specified B group vitamins: Secondary | ICD-10-CM | POA: Diagnosis not present

## 2017-08-11 DIAGNOSIS — E1140 Type 2 diabetes mellitus with diabetic neuropathy, unspecified: Secondary | ICD-10-CM | POA: Diagnosis not present

## 2017-08-13 DIAGNOSIS — E538 Deficiency of other specified B group vitamins: Secondary | ICD-10-CM | POA: Diagnosis not present

## 2017-08-17 DIAGNOSIS — Z85828 Personal history of other malignant neoplasm of skin: Secondary | ICD-10-CM | POA: Diagnosis not present

## 2017-08-17 DIAGNOSIS — B354 Tinea corporis: Secondary | ICD-10-CM | POA: Diagnosis not present

## 2017-09-10 ENCOUNTER — Encounter (INDEPENDENT_AMBULATORY_CARE_PROVIDER_SITE_OTHER): Payer: Medicare Other | Admitting: Ophthalmology

## 2017-09-13 ENCOUNTER — Encounter (INDEPENDENT_AMBULATORY_CARE_PROVIDER_SITE_OTHER): Payer: Medicare Other | Admitting: Ophthalmology

## 2017-09-13 DIAGNOSIS — H35033 Hypertensive retinopathy, bilateral: Secondary | ICD-10-CM

## 2017-09-13 DIAGNOSIS — E113313 Type 2 diabetes mellitus with moderate nonproliferative diabetic retinopathy with macular edema, bilateral: Secondary | ICD-10-CM | POA: Diagnosis not present

## 2017-09-13 DIAGNOSIS — H43813 Vitreous degeneration, bilateral: Secondary | ICD-10-CM | POA: Diagnosis not present

## 2017-09-13 DIAGNOSIS — I10 Essential (primary) hypertension: Secondary | ICD-10-CM

## 2017-09-13 DIAGNOSIS — E11311 Type 2 diabetes mellitus with unspecified diabetic retinopathy with macular edema: Secondary | ICD-10-CM

## 2017-09-14 DIAGNOSIS — E538 Deficiency of other specified B group vitamins: Secondary | ICD-10-CM | POA: Diagnosis not present

## 2017-09-24 DIAGNOSIS — Z85828 Personal history of other malignant neoplasm of skin: Secondary | ICD-10-CM | POA: Diagnosis not present

## 2017-09-24 DIAGNOSIS — L308 Other specified dermatitis: Secondary | ICD-10-CM | POA: Diagnosis not present

## 2017-10-19 DIAGNOSIS — E1140 Type 2 diabetes mellitus with diabetic neuropathy, unspecified: Secondary | ICD-10-CM | POA: Diagnosis not present

## 2017-10-19 DIAGNOSIS — Z0000 Encounter for general adult medical examination without abnormal findings: Secondary | ICD-10-CM | POA: Diagnosis not present

## 2017-10-19 DIAGNOSIS — E11319 Type 2 diabetes mellitus with unspecified diabetic retinopathy without macular edema: Secondary | ICD-10-CM | POA: Diagnosis not present

## 2017-10-19 DIAGNOSIS — E785 Hyperlipidemia, unspecified: Secondary | ICD-10-CM | POA: Diagnosis not present

## 2017-10-19 DIAGNOSIS — N400 Enlarged prostate without lower urinary tract symptoms: Secondary | ICD-10-CM | POA: Diagnosis not present

## 2017-10-19 DIAGNOSIS — I700 Atherosclerosis of aorta: Secondary | ICD-10-CM | POA: Diagnosis not present

## 2017-10-19 DIAGNOSIS — Z7984 Long term (current) use of oral hypoglycemic drugs: Secondary | ICD-10-CM | POA: Diagnosis not present

## 2017-10-19 DIAGNOSIS — K219 Gastro-esophageal reflux disease without esophagitis: Secondary | ICD-10-CM | POA: Diagnosis not present

## 2017-10-19 DIAGNOSIS — Z1389 Encounter for screening for other disorder: Secondary | ICD-10-CM | POA: Diagnosis not present

## 2017-10-19 DIAGNOSIS — E538 Deficiency of other specified B group vitamins: Secondary | ICD-10-CM | POA: Diagnosis not present

## 2017-10-21 DIAGNOSIS — H04123 Dry eye syndrome of bilateral lacrimal glands: Secondary | ICD-10-CM | POA: Diagnosis not present

## 2017-10-21 DIAGNOSIS — Z961 Presence of intraocular lens: Secondary | ICD-10-CM | POA: Diagnosis not present

## 2017-10-21 DIAGNOSIS — E113313 Type 2 diabetes mellitus with moderate nonproliferative diabetic retinopathy with macular edema, bilateral: Secondary | ICD-10-CM | POA: Diagnosis not present

## 2017-10-21 DIAGNOSIS — H401133 Primary open-angle glaucoma, bilateral, severe stage: Secondary | ICD-10-CM | POA: Diagnosis not present

## 2017-11-22 DIAGNOSIS — E538 Deficiency of other specified B group vitamins: Secondary | ICD-10-CM | POA: Diagnosis not present

## 2017-12-13 ENCOUNTER — Encounter (INDEPENDENT_AMBULATORY_CARE_PROVIDER_SITE_OTHER): Payer: Medicare Other | Admitting: Ophthalmology

## 2017-12-13 DIAGNOSIS — E11311 Type 2 diabetes mellitus with unspecified diabetic retinopathy with macular edema: Secondary | ICD-10-CM

## 2017-12-13 DIAGNOSIS — H35033 Hypertensive retinopathy, bilateral: Secondary | ICD-10-CM | POA: Diagnosis not present

## 2017-12-13 DIAGNOSIS — E113313 Type 2 diabetes mellitus with moderate nonproliferative diabetic retinopathy with macular edema, bilateral: Secondary | ICD-10-CM | POA: Diagnosis not present

## 2017-12-13 DIAGNOSIS — H43813 Vitreous degeneration, bilateral: Secondary | ICD-10-CM | POA: Diagnosis not present

## 2017-12-13 DIAGNOSIS — I10 Essential (primary) hypertension: Secondary | ICD-10-CM

## 2017-12-24 DIAGNOSIS — E538 Deficiency of other specified B group vitamins: Secondary | ICD-10-CM | POA: Diagnosis not present

## 2018-01-03 DIAGNOSIS — Z7984 Long term (current) use of oral hypoglycemic drugs: Secondary | ICD-10-CM | POA: Diagnosis not present

## 2018-01-03 DIAGNOSIS — I10 Essential (primary) hypertension: Secondary | ICD-10-CM | POA: Diagnosis not present

## 2018-01-03 DIAGNOSIS — E785 Hyperlipidemia, unspecified: Secondary | ICD-10-CM | POA: Diagnosis not present

## 2018-01-03 DIAGNOSIS — E113299 Type 2 diabetes mellitus with mild nonproliferative diabetic retinopathy without macular edema, unspecified eye: Secondary | ICD-10-CM | POA: Diagnosis not present

## 2018-01-03 DIAGNOSIS — N400 Enlarged prostate without lower urinary tract symptoms: Secondary | ICD-10-CM | POA: Diagnosis not present

## 2018-01-03 DIAGNOSIS — D649 Anemia, unspecified: Secondary | ICD-10-CM | POA: Diagnosis not present

## 2018-01-03 DIAGNOSIS — N182 Chronic kidney disease, stage 2 (mild): Secondary | ICD-10-CM | POA: Diagnosis not present

## 2018-01-03 DIAGNOSIS — E1140 Type 2 diabetes mellitus with diabetic neuropathy, unspecified: Secondary | ICD-10-CM | POA: Diagnosis not present

## 2018-01-24 DIAGNOSIS — E538 Deficiency of other specified B group vitamins: Secondary | ICD-10-CM | POA: Diagnosis not present

## 2018-02-10 DIAGNOSIS — J45901 Unspecified asthma with (acute) exacerbation: Secondary | ICD-10-CM | POA: Diagnosis not present

## 2018-02-10 DIAGNOSIS — J309 Allergic rhinitis, unspecified: Secondary | ICD-10-CM | POA: Diagnosis not present

## 2018-02-15 DIAGNOSIS — B354 Tinea corporis: Secondary | ICD-10-CM | POA: Diagnosis not present

## 2018-02-15 DIAGNOSIS — Z85828 Personal history of other malignant neoplasm of skin: Secondary | ICD-10-CM | POA: Diagnosis not present

## 2018-02-15 DIAGNOSIS — B0089 Other herpesviral infection: Secondary | ICD-10-CM | POA: Diagnosis not present

## 2018-02-17 DIAGNOSIS — H6121 Impacted cerumen, right ear: Secondary | ICD-10-CM | POA: Diagnosis not present

## 2018-02-18 DIAGNOSIS — E1140 Type 2 diabetes mellitus with diabetic neuropathy, unspecified: Secondary | ICD-10-CM | POA: Diagnosis not present

## 2018-02-18 DIAGNOSIS — E785 Hyperlipidemia, unspecified: Secondary | ICD-10-CM | POA: Diagnosis not present

## 2018-02-18 DIAGNOSIS — I10 Essential (primary) hypertension: Secondary | ICD-10-CM | POA: Diagnosis not present

## 2018-02-18 DIAGNOSIS — Z7984 Long term (current) use of oral hypoglycemic drugs: Secondary | ICD-10-CM | POA: Diagnosis not present

## 2018-02-28 DIAGNOSIS — E538 Deficiency of other specified B group vitamins: Secondary | ICD-10-CM | POA: Diagnosis not present

## 2018-03-31 DIAGNOSIS — E538 Deficiency of other specified B group vitamins: Secondary | ICD-10-CM | POA: Diagnosis not present

## 2018-05-02 DIAGNOSIS — E538 Deficiency of other specified B group vitamins: Secondary | ICD-10-CM | POA: Diagnosis not present

## 2018-05-05 DIAGNOSIS — E113313 Type 2 diabetes mellitus with moderate nonproliferative diabetic retinopathy with macular edema, bilateral: Secondary | ICD-10-CM | POA: Diagnosis not present

## 2018-05-05 DIAGNOSIS — H04123 Dry eye syndrome of bilateral lacrimal glands: Secondary | ICD-10-CM | POA: Diagnosis not present

## 2018-05-05 DIAGNOSIS — H401133 Primary open-angle glaucoma, bilateral, severe stage: Secondary | ICD-10-CM | POA: Diagnosis not present

## 2018-05-05 DIAGNOSIS — Z961 Presence of intraocular lens: Secondary | ICD-10-CM | POA: Diagnosis not present

## 2018-05-23 ENCOUNTER — Encounter (INDEPENDENT_AMBULATORY_CARE_PROVIDER_SITE_OTHER): Payer: Medicare Other | Admitting: Ophthalmology

## 2018-05-23 DIAGNOSIS — E113313 Type 2 diabetes mellitus with moderate nonproliferative diabetic retinopathy with macular edema, bilateral: Secondary | ICD-10-CM

## 2018-05-23 DIAGNOSIS — K3184 Gastroparesis: Secondary | ICD-10-CM | POA: Diagnosis not present

## 2018-05-23 DIAGNOSIS — H43813 Vitreous degeneration, bilateral: Secondary | ICD-10-CM

## 2018-05-23 DIAGNOSIS — I10 Essential (primary) hypertension: Secondary | ICD-10-CM | POA: Diagnosis not present

## 2018-05-23 DIAGNOSIS — E11311 Type 2 diabetes mellitus with unspecified diabetic retinopathy with macular edema: Secondary | ICD-10-CM

## 2018-05-23 DIAGNOSIS — H35033 Hypertensive retinopathy, bilateral: Secondary | ICD-10-CM | POA: Diagnosis not present

## 2018-06-02 DIAGNOSIS — E538 Deficiency of other specified B group vitamins: Secondary | ICD-10-CM | POA: Diagnosis not present

## 2018-07-04 DIAGNOSIS — E538 Deficiency of other specified B group vitamins: Secondary | ICD-10-CM | POA: Diagnosis not present

## 2018-08-05 DIAGNOSIS — E538 Deficiency of other specified B group vitamins: Secondary | ICD-10-CM | POA: Diagnosis not present

## 2018-08-17 ENCOUNTER — Encounter (INDEPENDENT_AMBULATORY_CARE_PROVIDER_SITE_OTHER): Payer: Self-pay | Admitting: Family

## 2018-08-17 ENCOUNTER — Ambulatory Visit (INDEPENDENT_AMBULATORY_CARE_PROVIDER_SITE_OTHER): Payer: Medicare Other

## 2018-08-17 ENCOUNTER — Ambulatory Visit (INDEPENDENT_AMBULATORY_CARE_PROVIDER_SITE_OTHER): Payer: Medicare Other | Admitting: Family

## 2018-08-17 VITALS — Ht 67.0 in | Wt 162.0 lb

## 2018-08-17 DIAGNOSIS — G8929 Other chronic pain: Secondary | ICD-10-CM | POA: Diagnosis not present

## 2018-08-17 DIAGNOSIS — M25562 Pain in left knee: Secondary | ICD-10-CM

## 2018-08-17 DIAGNOSIS — M1712 Unilateral primary osteoarthritis, left knee: Secondary | ICD-10-CM | POA: Diagnosis not present

## 2018-08-17 MED ORDER — LIDOCAINE HCL 1 % IJ SOLN
5.0000 mL | INTRAMUSCULAR | Status: AC | PRN
  Administered 2018-08-17: 5 mL

## 2018-08-17 MED ORDER — METHYLPREDNISOLONE ACETATE 40 MG/ML IJ SUSP
40.0000 mg | INTRAMUSCULAR | Status: AC | PRN
  Administered 2018-08-17: 40 mg via INTRA_ARTICULAR

## 2018-08-17 NOTE — Progress Notes (Signed)
Office Visit Note   Patient: Andre Mills           Date of Birth: 04-22-1925           MRN: [id number] Visit Date: 08/17/2018              Requested by: No referring provider defined for this encounter. PCP: Patient, No Pcp Per  Chief Complaint  Patient presents with  . Left Knee - Pain      HPI: The patient is a 84 year old gentleman seen today for worsening of chronic knee pain. States twisted his knee getting to standing position a few weeks ago. Worse pain since. Pain with weight bearing. Some locking and catching. No giving way.   Has had good relief in the past with depomedrol injections which lasted for years.  Does daily treadmill and recumbent bicycle exercise.   Assessment & Plan: Visit Diagnoses:  1. Chronic pain of left knee   2. Primary osteoarthritis of left knee     Plan: discussed possibility of meniscal injury. Will do depomedrol injection today. Conservative measures. Bicycling encouraged.   Follow-Up Instructions: Return in about 4 weeks (around 09/14/2018), or if symptoms worsen or fail to improve.   Right Knee Exam   Muscle Strength  The patient has normal right knee strength.   Left Knee Exam   Tenderness  The patient is experiencing tenderness in the medial joint line.  Range of Motion  The patient has normal left knee ROM.  Tests  Varus: negative Valgus: negative  Other  Erythema: absent Swelling: mild      Patient is alert, oriented, no adenopathy, well-dressed, normal affect, normal respiratory effort.   Imaging: No results found. No images are attached to the encounter.  Labs: No results found for: HGBA1C, ESRSEDRATE, CRP, LABURIC, REPTSTATUS, GRAMSTAIN, CULT, LABORGA   Lab Results  Component Value Date   ALBUMIN 4.0 05/14/2009    Body mass index is 25.37 kg/m.  Orders:  Orders Placed This Encounter  Procedures  . XR Knee 1-2 Views Left   No orders of the defined types were placed in this  encounter.    Procedures: Large Joint Inj: L knee on 08/17/2018 11:42 AM Indications: pain Details: 18 G 1.5 in needle, anteromedial approach Medications: 5 mL lidocaine 1 %; 40 mg methylPREDNISolone acetate 40 MG/ML Consent was given by the patient.      Clinical Data: No additional findings.  ROS:  All other systems negative, except as noted in the HPI. Review of Systems  Constitutional: Negative for chills and fever.  Musculoskeletal: Positive for arthralgias, gait problem and joint swelling.    Objective: Vital Signs: Ht 5\' 7"  (1.702 m)   Wt 162 lb (73.5 kg)   BMI 25.37 kg/m   Specialty Comments:  No specialty comments available.  PMFS History: There are no active problems to display for this patient.  Past Medical History:  Diagnosis Date  . Anemia   . Arthritis   . Diabetes mellitus without complication ([place])   . GERD (gastroesophageal reflux disease)   . High cholesterol   . Hypertension   . Wears dentures    TOP  . Wears glasses     History reviewed. No pertinent family history.  Past Surgical History:  Procedure Laterality Date  . COLONOSCOPY    . COLONOSCOPY W/ BIOPSIES AND POLYPECTOMY    . EYE SURGERY     both cataracts  . KNEE ARTHROSCOPY     left  .  TONSILLECTOMY    . TRIGGER FINGER RELEASE Right 06/28/2014   Procedure: RELEASE A-1 PULLEY POSSIBLE EXCISION ONE SLIP SUPERFICIALIS  RIGHT MIDDLE FINGER ;  Surgeon: Daryll Brod, MD;  Location: [facility];  Service: Orthopedics;  Laterality: Right;   Social History   Occupational History  . Not on file  Tobacco Use  . Smoking status: Former Smoker    Last attempt to quit: 06/23/1959    Years since quitting: 59.1  . Smokeless tobacco: Never Used  Substance and Sexual Activity  . Alcohol use: No  . Drug use: No  . Sexual activity: Not on file

## 2018-08-23 DIAGNOSIS — B354 Tinea corporis: Secondary | ICD-10-CM | POA: Diagnosis not present

## 2018-08-23 DIAGNOSIS — Z85828 Personal history of other malignant neoplasm of skin: Secondary | ICD-10-CM | POA: Diagnosis not present

## 2018-09-05 DIAGNOSIS — E538 Deficiency of other specified B group vitamins: Secondary | ICD-10-CM | POA: Diagnosis not present

## 2018-09-05 DIAGNOSIS — Z23 Encounter for immunization: Secondary | ICD-10-CM | POA: Diagnosis not present

## 2018-10-10 DIAGNOSIS — E538 Deficiency of other specified B group vitamins: Secondary | ICD-10-CM | POA: Diagnosis not present

## 2018-10-20 DIAGNOSIS — H401133 Primary open-angle glaucoma, bilateral, severe stage: Secondary | ICD-10-CM | POA: Diagnosis not present

## 2018-10-20 DIAGNOSIS — H04123 Dry eye syndrome of bilateral lacrimal glands: Secondary | ICD-10-CM | POA: Diagnosis not present

## 2018-10-20 DIAGNOSIS — E113313 Type 2 diabetes mellitus with moderate nonproliferative diabetic retinopathy with macular edema, bilateral: Secondary | ICD-10-CM | POA: Diagnosis not present

## 2018-10-20 DIAGNOSIS — Z961 Presence of intraocular lens: Secondary | ICD-10-CM | POA: Diagnosis not present

## 2018-10-21 DIAGNOSIS — D649 Anemia, unspecified: Secondary | ICD-10-CM | POA: Diagnosis not present

## 2018-10-21 DIAGNOSIS — K219 Gastro-esophageal reflux disease without esophagitis: Secondary | ICD-10-CM | POA: Diagnosis not present

## 2018-10-21 DIAGNOSIS — Z0000 Encounter for general adult medical examination without abnormal findings: Secondary | ICD-10-CM | POA: Diagnosis not present

## 2018-10-21 DIAGNOSIS — E785 Hyperlipidemia, unspecified: Secondary | ICD-10-CM | POA: Diagnosis not present

## 2018-10-21 DIAGNOSIS — Z1389 Encounter for screening for other disorder: Secondary | ICD-10-CM | POA: Diagnosis not present

## 2018-10-21 DIAGNOSIS — E1140 Type 2 diabetes mellitus with diabetic neuropathy, unspecified: Secondary | ICD-10-CM | POA: Diagnosis not present

## 2018-10-21 DIAGNOSIS — I10 Essential (primary) hypertension: Secondary | ICD-10-CM | POA: Diagnosis not present

## 2018-10-21 DIAGNOSIS — E538 Deficiency of other specified B group vitamins: Secondary | ICD-10-CM | POA: Diagnosis not present

## 2018-10-21 DIAGNOSIS — E11319 Type 2 diabetes mellitus with unspecified diabetic retinopathy without macular edema: Secondary | ICD-10-CM | POA: Diagnosis not present

## 2018-10-21 DIAGNOSIS — Z23 Encounter for immunization: Secondary | ICD-10-CM | POA: Diagnosis not present

## 2018-10-21 DIAGNOSIS — I700 Atherosclerosis of aorta: Secondary | ICD-10-CM | POA: Diagnosis not present

## 2018-11-14 DIAGNOSIS — E538 Deficiency of other specified B group vitamins: Secondary | ICD-10-CM | POA: Diagnosis not present

## 2018-11-21 ENCOUNTER — Encounter (INDEPENDENT_AMBULATORY_CARE_PROVIDER_SITE_OTHER): Payer: Medicare Other | Admitting: Ophthalmology

## 2018-12-05 DIAGNOSIS — N400 Enlarged prostate without lower urinary tract symptoms: Secondary | ICD-10-CM | POA: Diagnosis not present

## 2018-12-05 DIAGNOSIS — D649 Anemia, unspecified: Secondary | ICD-10-CM | POA: Diagnosis not present

## 2018-12-05 DIAGNOSIS — J45901 Unspecified asthma with (acute) exacerbation: Secondary | ICD-10-CM | POA: Diagnosis not present

## 2018-12-05 DIAGNOSIS — E1140 Type 2 diabetes mellitus with diabetic neuropathy, unspecified: Secondary | ICD-10-CM | POA: Diagnosis not present

## 2018-12-05 DIAGNOSIS — E785 Hyperlipidemia, unspecified: Secondary | ICD-10-CM | POA: Diagnosis not present

## 2018-12-05 DIAGNOSIS — N182 Chronic kidney disease, stage 2 (mild): Secondary | ICD-10-CM | POA: Diagnosis not present

## 2018-12-05 DIAGNOSIS — I10 Essential (primary) hypertension: Secondary | ICD-10-CM | POA: Diagnosis not present

## 2018-12-16 DIAGNOSIS — E538 Deficiency of other specified B group vitamins: Secondary | ICD-10-CM | POA: Diagnosis not present

## 2018-12-19 ENCOUNTER — Encounter (INDEPENDENT_AMBULATORY_CARE_PROVIDER_SITE_OTHER): Payer: Medicare Other | Admitting: Ophthalmology

## 2018-12-19 DIAGNOSIS — H35033 Hypertensive retinopathy, bilateral: Secondary | ICD-10-CM | POA: Diagnosis not present

## 2018-12-19 DIAGNOSIS — H43813 Vitreous degeneration, bilateral: Secondary | ICD-10-CM

## 2018-12-19 DIAGNOSIS — I10 Essential (primary) hypertension: Secondary | ICD-10-CM

## 2018-12-19 DIAGNOSIS — E11311 Type 2 diabetes mellitus with unspecified diabetic retinopathy with macular edema: Secondary | ICD-10-CM | POA: Diagnosis not present

## 2018-12-19 DIAGNOSIS — E113313 Type 2 diabetes mellitus with moderate nonproliferative diabetic retinopathy with macular edema, bilateral: Secondary | ICD-10-CM

## 2019-01-05 DIAGNOSIS — N182 Chronic kidney disease, stage 2 (mild): Secondary | ICD-10-CM | POA: Diagnosis not present

## 2019-01-05 DIAGNOSIS — N400 Enlarged prostate without lower urinary tract symptoms: Secondary | ICD-10-CM | POA: Diagnosis not present

## 2019-01-05 DIAGNOSIS — E11319 Type 2 diabetes mellitus with unspecified diabetic retinopathy without macular edema: Secondary | ICD-10-CM | POA: Diagnosis not present

## 2019-01-05 DIAGNOSIS — J45901 Unspecified asthma with (acute) exacerbation: Secondary | ICD-10-CM | POA: Diagnosis not present

## 2019-01-05 DIAGNOSIS — D649 Anemia, unspecified: Secondary | ICD-10-CM | POA: Diagnosis not present

## 2019-01-05 DIAGNOSIS — E1140 Type 2 diabetes mellitus with diabetic neuropathy, unspecified: Secondary | ICD-10-CM | POA: Diagnosis not present

## 2019-01-05 DIAGNOSIS — I10 Essential (primary) hypertension: Secondary | ICD-10-CM | POA: Diagnosis not present

## 2019-01-05 DIAGNOSIS — E785 Hyperlipidemia, unspecified: Secondary | ICD-10-CM | POA: Diagnosis not present

## 2019-01-09 DIAGNOSIS — H6122 Impacted cerumen, left ear: Secondary | ICD-10-CM | POA: Diagnosis not present

## 2019-01-09 DIAGNOSIS — H903 Sensorineural hearing loss, bilateral: Secondary | ICD-10-CM | POA: Diagnosis not present

## 2019-01-16 DIAGNOSIS — E538 Deficiency of other specified B group vitamins: Secondary | ICD-10-CM | POA: Diagnosis not present

## 2019-01-16 DIAGNOSIS — J069 Acute upper respiratory infection, unspecified: Secondary | ICD-10-CM | POA: Diagnosis not present

## 2019-02-07 DIAGNOSIS — E11319 Type 2 diabetes mellitus with unspecified diabetic retinopathy without macular edema: Secondary | ICD-10-CM | POA: Diagnosis not present

## 2019-02-07 DIAGNOSIS — E785 Hyperlipidemia, unspecified: Secondary | ICD-10-CM | POA: Diagnosis not present

## 2019-02-07 DIAGNOSIS — D649 Anemia, unspecified: Secondary | ICD-10-CM | POA: Diagnosis not present

## 2019-02-07 DIAGNOSIS — E1140 Type 2 diabetes mellitus with diabetic neuropathy, unspecified: Secondary | ICD-10-CM | POA: Diagnosis not present

## 2019-02-07 DIAGNOSIS — J45901 Unspecified asthma with (acute) exacerbation: Secondary | ICD-10-CM | POA: Diagnosis not present

## 2019-02-07 DIAGNOSIS — I10 Essential (primary) hypertension: Secondary | ICD-10-CM | POA: Diagnosis not present

## 2019-02-07 DIAGNOSIS — N182 Chronic kidney disease, stage 2 (mild): Secondary | ICD-10-CM | POA: Diagnosis not present

## 2019-02-07 DIAGNOSIS — N400 Enlarged prostate without lower urinary tract symptoms: Secondary | ICD-10-CM | POA: Diagnosis not present

## 2019-04-10 DIAGNOSIS — E538 Deficiency of other specified B group vitamins: Secondary | ICD-10-CM | POA: Diagnosis not present

## 2019-04-17 DIAGNOSIS — E538 Deficiency of other specified B group vitamins: Secondary | ICD-10-CM | POA: Diagnosis not present

## 2019-04-25 DIAGNOSIS — E1140 Type 2 diabetes mellitus with diabetic neuropathy, unspecified: Secondary | ICD-10-CM | POA: Diagnosis not present

## 2019-04-25 DIAGNOSIS — I10 Essential (primary) hypertension: Secondary | ICD-10-CM | POA: Diagnosis not present

## 2019-04-25 DIAGNOSIS — N182 Chronic kidney disease, stage 2 (mild): Secondary | ICD-10-CM | POA: Diagnosis not present

## 2019-04-25 DIAGNOSIS — K5901 Slow transit constipation: Secondary | ICD-10-CM | POA: Diagnosis not present

## 2019-04-28 DIAGNOSIS — D649 Anemia, unspecified: Secondary | ICD-10-CM | POA: Diagnosis not present

## 2019-04-28 DIAGNOSIS — E785 Hyperlipidemia, unspecified: Secondary | ICD-10-CM | POA: Diagnosis not present

## 2019-04-28 DIAGNOSIS — E1140 Type 2 diabetes mellitus with diabetic neuropathy, unspecified: Secondary | ICD-10-CM | POA: Diagnosis not present

## 2019-04-28 DIAGNOSIS — I10 Essential (primary) hypertension: Secondary | ICD-10-CM | POA: Diagnosis not present

## 2019-04-28 DIAGNOSIS — N182 Chronic kidney disease, stage 2 (mild): Secondary | ICD-10-CM | POA: Diagnosis not present

## 2019-04-28 DIAGNOSIS — N400 Enlarged prostate without lower urinary tract symptoms: Secondary | ICD-10-CM | POA: Diagnosis not present

## 2019-04-28 DIAGNOSIS — J45901 Unspecified asthma with (acute) exacerbation: Secondary | ICD-10-CM | POA: Diagnosis not present

## 2019-05-04 DIAGNOSIS — Z961 Presence of intraocular lens: Secondary | ICD-10-CM | POA: Diagnosis not present

## 2019-05-04 DIAGNOSIS — H401133 Primary open-angle glaucoma, bilateral, severe stage: Secondary | ICD-10-CM | POA: Diagnosis not present

## 2019-05-04 DIAGNOSIS — E113313 Type 2 diabetes mellitus with moderate nonproliferative diabetic retinopathy with macular edema, bilateral: Secondary | ICD-10-CM | POA: Diagnosis not present

## 2019-05-04 DIAGNOSIS — H04123 Dry eye syndrome of bilateral lacrimal glands: Secondary | ICD-10-CM | POA: Diagnosis not present

## 2019-05-11 DIAGNOSIS — E538 Deficiency of other specified B group vitamins: Secondary | ICD-10-CM | POA: Diagnosis not present

## 2019-05-22 ENCOUNTER — Encounter (INDEPENDENT_AMBULATORY_CARE_PROVIDER_SITE_OTHER): Payer: Medicare Other | Admitting: Ophthalmology

## 2019-05-22 ENCOUNTER — Other Ambulatory Visit: Payer: Self-pay

## 2019-05-22 DIAGNOSIS — E11311 Type 2 diabetes mellitus with unspecified diabetic retinopathy with macular edema: Secondary | ICD-10-CM

## 2019-05-22 DIAGNOSIS — H43813 Vitreous degeneration, bilateral: Secondary | ICD-10-CM | POA: Diagnosis not present

## 2019-05-22 DIAGNOSIS — I10 Essential (primary) hypertension: Secondary | ICD-10-CM

## 2019-05-22 DIAGNOSIS — E113313 Type 2 diabetes mellitus with moderate nonproliferative diabetic retinopathy with macular edema, bilateral: Secondary | ICD-10-CM

## 2019-05-22 DIAGNOSIS — H35033 Hypertensive retinopathy, bilateral: Secondary | ICD-10-CM

## 2019-06-05 DIAGNOSIS — K589 Irritable bowel syndrome without diarrhea: Secondary | ICD-10-CM | POA: Diagnosis not present

## 2019-06-05 DIAGNOSIS — Z125 Encounter for screening for malignant neoplasm of prostate: Secondary | ICD-10-CM | POA: Diagnosis not present

## 2019-06-05 DIAGNOSIS — R350 Frequency of micturition: Secondary | ICD-10-CM | POA: Diagnosis not present

## 2019-06-05 DIAGNOSIS — R1030 Lower abdominal pain, unspecified: Secondary | ICD-10-CM | POA: Diagnosis not present

## 2019-06-12 DIAGNOSIS — R351 Nocturia: Secondary | ICD-10-CM | POA: Diagnosis not present

## 2019-06-12 DIAGNOSIS — N401 Enlarged prostate with lower urinary tract symptoms: Secondary | ICD-10-CM | POA: Diagnosis not present

## 2019-06-15 DIAGNOSIS — E538 Deficiency of other specified B group vitamins: Secondary | ICD-10-CM | POA: Diagnosis not present

## 2019-06-30 DIAGNOSIS — E113291 Type 2 diabetes mellitus with mild nonproliferative diabetic retinopathy without macular edema, right eye: Secondary | ICD-10-CM | POA: Diagnosis not present

## 2019-06-30 DIAGNOSIS — E1140 Type 2 diabetes mellitus with diabetic neuropathy, unspecified: Secondary | ICD-10-CM | POA: Diagnosis not present

## 2019-06-30 DIAGNOSIS — N182 Chronic kidney disease, stage 2 (mild): Secondary | ICD-10-CM | POA: Diagnosis not present

## 2019-06-30 DIAGNOSIS — E785 Hyperlipidemia, unspecified: Secondary | ICD-10-CM | POA: Diagnosis not present

## 2019-06-30 DIAGNOSIS — N400 Enlarged prostate without lower urinary tract symptoms: Secondary | ICD-10-CM | POA: Diagnosis not present

## 2019-06-30 DIAGNOSIS — J45901 Unspecified asthma with (acute) exacerbation: Secondary | ICD-10-CM | POA: Diagnosis not present

## 2019-06-30 DIAGNOSIS — I10 Essential (primary) hypertension: Secondary | ICD-10-CM | POA: Diagnosis not present

## 2019-06-30 DIAGNOSIS — D649 Anemia, unspecified: Secondary | ICD-10-CM | POA: Diagnosis not present

## 2019-07-18 DIAGNOSIS — E538 Deficiency of other specified B group vitamins: Secondary | ICD-10-CM | POA: Diagnosis not present

## 2019-08-18 DIAGNOSIS — E538 Deficiency of other specified B group vitamins: Secondary | ICD-10-CM | POA: Diagnosis not present

## 2019-08-18 DIAGNOSIS — Z23 Encounter for immunization: Secondary | ICD-10-CM | POA: Diagnosis not present

## 2019-09-04 LAB — HM DIABETES EYE EXAM

## 2019-09-11 ENCOUNTER — Ambulatory Visit (INDEPENDENT_AMBULATORY_CARE_PROVIDER_SITE_OTHER): Payer: Medicare Other | Admitting: Podiatry

## 2019-09-11 ENCOUNTER — Other Ambulatory Visit: Payer: Self-pay

## 2019-09-11 VITALS — BP 181/80 | HR 65

## 2019-09-11 DIAGNOSIS — M79674 Pain in right toe(s): Secondary | ICD-10-CM | POA: Diagnosis not present

## 2019-09-11 DIAGNOSIS — L603 Nail dystrophy: Secondary | ICD-10-CM | POA: Diagnosis not present

## 2019-09-14 NOTE — Progress Notes (Signed)
   HPI: 85 y.o. male presenting today as a new patient with a chief complaint of an injury to the right great toenail that occurred yesterday. He states he was drying his foot off with a towel when the towel got caught under the nail. He states the nail cracked down the middle of nail. Touching the toe increases the pain. He has not had any treatment for the symptoms. Patient is here for further evaluation and treatment.   Past Medical History:  Diagnosis Date  . Anemia   . Arthritis   . Diabetes mellitus without complication ([place])   . GERD (gastroesophageal reflux disease)   . High cholesterol   . Hypertension   . Wears dentures    TOP  . Wears glasses      Physical Exam: General: The patient is alert and oriented x3 in no acute distress.  Dermatology: Partially detached nail plate noted to the right great toe. Skin is warm, dry and supple bilateral lower extremities. Negative for open lesions or macerations.  Vascular: Palpable pedal pulses bilaterally. No edema or erythema noted. Capillary refill within normal limits.  Neurological: Epicritic and protective threshold grossly intact bilaterally.   Musculoskeletal Exam: Range of motion within normal limits to all pedal and ankle joints bilateral. Muscle strength 5/5 in all groups bilateral.    Assessment: 1. Trauma right great toenail plate    Plan of Care:  1. Patient evaluated.  2. Mechanical debridement of the right great toenail performed using a nail nipper. Filed with dremel without incident.  3. Recommended OTC antibiotic ointment daily.  4. Recommended good shoe gear.  5. Return to clinic as needed.       Edrick Kins, DPM [facility]  Dr. Edrick Kins, DPM    [address] [place],  [address]                Office [phone]  Fax [phone]

## 2019-09-20 DIAGNOSIS — E538 Deficiency of other specified B group vitamins: Secondary | ICD-10-CM | POA: Diagnosis not present

## 2019-10-09 DIAGNOSIS — N182 Chronic kidney disease, stage 2 (mild): Secondary | ICD-10-CM | POA: Diagnosis not present

## 2019-10-09 DIAGNOSIS — I10 Essential (primary) hypertension: Secondary | ICD-10-CM | POA: Diagnosis not present

## 2019-10-09 DIAGNOSIS — D649 Anemia, unspecified: Secondary | ICD-10-CM | POA: Diagnosis not present

## 2019-10-09 DIAGNOSIS — E1140 Type 2 diabetes mellitus with diabetic neuropathy, unspecified: Secondary | ICD-10-CM | POA: Diagnosis not present

## 2019-10-09 DIAGNOSIS — Z7984 Long term (current) use of oral hypoglycemic drugs: Secondary | ICD-10-CM | POA: Diagnosis not present

## 2019-10-09 DIAGNOSIS — E785 Hyperlipidemia, unspecified: Secondary | ICD-10-CM | POA: Diagnosis not present

## 2019-10-09 DIAGNOSIS — E11319 Type 2 diabetes mellitus with unspecified diabetic retinopathy without macular edema: Secondary | ICD-10-CM | POA: Diagnosis not present

## 2019-10-09 DIAGNOSIS — N400 Enlarged prostate without lower urinary tract symptoms: Secondary | ICD-10-CM | POA: Diagnosis not present

## 2019-10-23 ENCOUNTER — Other Ambulatory Visit: Payer: Self-pay

## 2019-10-23 ENCOUNTER — Encounter (INDEPENDENT_AMBULATORY_CARE_PROVIDER_SITE_OTHER): Payer: Medicare Other | Admitting: Ophthalmology

## 2019-10-23 DIAGNOSIS — E113391 Type 2 diabetes mellitus with moderate nonproliferative diabetic retinopathy without macular edema, right eye: Secondary | ICD-10-CM | POA: Diagnosis not present

## 2019-10-23 DIAGNOSIS — E113312 Type 2 diabetes mellitus with moderate nonproliferative diabetic retinopathy with macular edema, left eye: Secondary | ICD-10-CM

## 2019-10-23 DIAGNOSIS — E11311 Type 2 diabetes mellitus with unspecified diabetic retinopathy with macular edema: Secondary | ICD-10-CM | POA: Diagnosis not present

## 2019-10-23 DIAGNOSIS — I10 Essential (primary) hypertension: Secondary | ICD-10-CM

## 2019-10-23 DIAGNOSIS — H43813 Vitreous degeneration, bilateral: Secondary | ICD-10-CM

## 2019-10-23 DIAGNOSIS — H35033 Hypertensive retinopathy, bilateral: Secondary | ICD-10-CM | POA: Diagnosis not present

## 2019-10-27 DIAGNOSIS — M19042 Primary osteoarthritis, left hand: Secondary | ICD-10-CM | POA: Insufficient documentation

## 2019-10-27 DIAGNOSIS — R52 Pain, unspecified: Secondary | ICD-10-CM | POA: Insufficient documentation

## 2019-10-27 DIAGNOSIS — M19032 Primary osteoarthritis, left wrist: Secondary | ICD-10-CM | POA: Insufficient documentation

## 2019-11-13 DIAGNOSIS — W19XXXS Unspecified fall, sequela: Secondary | ICD-10-CM | POA: Diagnosis not present

## 2019-11-13 DIAGNOSIS — N182 Chronic kidney disease, stage 2 (mild): Secondary | ICD-10-CM | POA: Diagnosis not present

## 2019-11-13 DIAGNOSIS — Z7984 Long term (current) use of oral hypoglycemic drugs: Secondary | ICD-10-CM | POA: Diagnosis not present

## 2019-11-13 DIAGNOSIS — I10 Essential (primary) hypertension: Secondary | ICD-10-CM | POA: Diagnosis not present

## 2019-11-13 DIAGNOSIS — E11319 Type 2 diabetes mellitus with unspecified diabetic retinopathy without macular edema: Secondary | ICD-10-CM | POA: Diagnosis not present

## 2019-11-13 DIAGNOSIS — E1140 Type 2 diabetes mellitus with diabetic neuropathy, unspecified: Secondary | ICD-10-CM | POA: Diagnosis not present

## 2019-11-13 DIAGNOSIS — E785 Hyperlipidemia, unspecified: Secondary | ICD-10-CM | POA: Diagnosis not present

## 2019-11-17 DIAGNOSIS — M19042 Primary osteoarthritis, left hand: Secondary | ICD-10-CM | POA: Diagnosis not present

## 2019-11-17 DIAGNOSIS — M19032 Primary osteoarthritis, left wrist: Secondary | ICD-10-CM | POA: Diagnosis not present

## 2019-11-28 DIAGNOSIS — E538 Deficiency of other specified B group vitamins: Secondary | ICD-10-CM | POA: Diagnosis not present

## 2019-12-01 DIAGNOSIS — M25531 Pain in right wrist: Secondary | ICD-10-CM | POA: Diagnosis not present

## 2019-12-01 DIAGNOSIS — M19031 Primary osteoarthritis, right wrist: Secondary | ICD-10-CM | POA: Diagnosis not present

## 2019-12-19 DIAGNOSIS — B354 Tinea corporis: Secondary | ICD-10-CM | POA: Diagnosis not present

## 2019-12-19 DIAGNOSIS — Z85828 Personal history of other malignant neoplasm of skin: Secondary | ICD-10-CM | POA: Diagnosis not present

## 2019-12-20 DIAGNOSIS — R21 Rash and other nonspecific skin eruption: Secondary | ICD-10-CM | POA: Diagnosis not present

## 2019-12-20 DIAGNOSIS — R5383 Other fatigue: Secondary | ICD-10-CM | POA: Diagnosis not present

## 2019-12-20 DIAGNOSIS — R634 Abnormal weight loss: Secondary | ICD-10-CM | POA: Diagnosis not present

## 2019-12-20 DIAGNOSIS — N289 Disorder of kidney and ureter, unspecified: Secondary | ICD-10-CM | POA: Diagnosis not present

## 2020-01-01 DIAGNOSIS — M25531 Pain in right wrist: Secondary | ICD-10-CM | POA: Diagnosis not present

## 2020-01-02 DIAGNOSIS — E538 Deficiency of other specified B group vitamins: Secondary | ICD-10-CM | POA: Diagnosis not present

## 2020-01-19 DIAGNOSIS — R634 Abnormal weight loss: Secondary | ICD-10-CM | POA: Diagnosis not present

## 2020-01-19 DIAGNOSIS — E1140 Type 2 diabetes mellitus with diabetic neuropathy, unspecified: Secondary | ICD-10-CM | POA: Diagnosis not present

## 2020-01-19 DIAGNOSIS — I10 Essential (primary) hypertension: Secondary | ICD-10-CM | POA: Diagnosis not present

## 2020-02-01 DIAGNOSIS — E539 Vitamin B deficiency, unspecified: Secondary | ICD-10-CM | POA: Diagnosis not present

## 2020-02-19 DIAGNOSIS — M19032 Primary osteoarthritis, left wrist: Secondary | ICD-10-CM | POA: Diagnosis not present

## 2020-02-19 DIAGNOSIS — M19042 Primary osteoarthritis, left hand: Secondary | ICD-10-CM | POA: Diagnosis not present

## 2020-02-19 DIAGNOSIS — M19041 Primary osteoarthritis, right hand: Secondary | ICD-10-CM | POA: Diagnosis not present

## 2020-02-28 DIAGNOSIS — H401133 Primary open-angle glaucoma, bilateral, severe stage: Secondary | ICD-10-CM | POA: Diagnosis not present

## 2020-02-28 DIAGNOSIS — H04123 Dry eye syndrome of bilateral lacrimal glands: Secondary | ICD-10-CM | POA: Diagnosis not present

## 2020-02-28 DIAGNOSIS — E113313 Type 2 diabetes mellitus with moderate nonproliferative diabetic retinopathy with macular edema, bilateral: Secondary | ICD-10-CM | POA: Diagnosis not present

## 2020-02-28 DIAGNOSIS — Z961 Presence of intraocular lens: Secondary | ICD-10-CM | POA: Diagnosis not present

## 2020-03-06 DIAGNOSIS — E539 Vitamin B deficiency, unspecified: Secondary | ICD-10-CM | POA: Diagnosis not present

## 2020-03-18 ENCOUNTER — Other Ambulatory Visit: Payer: Self-pay

## 2020-03-18 ENCOUNTER — Encounter (INDEPENDENT_AMBULATORY_CARE_PROVIDER_SITE_OTHER): Payer: Medicare Other | Admitting: Ophthalmology

## 2020-03-18 DIAGNOSIS — E113312 Type 2 diabetes mellitus with moderate nonproliferative diabetic retinopathy with macular edema, left eye: Secondary | ICD-10-CM | POA: Diagnosis not present

## 2020-03-18 DIAGNOSIS — H43813 Vitreous degeneration, bilateral: Secondary | ICD-10-CM

## 2020-03-18 DIAGNOSIS — E113391 Type 2 diabetes mellitus with moderate nonproliferative diabetic retinopathy without macular edema, right eye: Secondary | ICD-10-CM

## 2020-03-18 DIAGNOSIS — I10 Essential (primary) hypertension: Secondary | ICD-10-CM | POA: Diagnosis not present

## 2020-03-18 DIAGNOSIS — E11311 Type 2 diabetes mellitus with unspecified diabetic retinopathy with macular edema: Secondary | ICD-10-CM

## 2020-03-18 DIAGNOSIS — H35033 Hypertensive retinopathy, bilateral: Secondary | ICD-10-CM | POA: Diagnosis not present

## 2020-03-20 DIAGNOSIS — M19042 Primary osteoarthritis, left hand: Secondary | ICD-10-CM | POA: Diagnosis not present

## 2020-03-21 DIAGNOSIS — E538 Deficiency of other specified B group vitamins: Secondary | ICD-10-CM | POA: Diagnosis not present

## 2020-03-21 DIAGNOSIS — E1140 Type 2 diabetes mellitus with diabetic neuropathy, unspecified: Secondary | ICD-10-CM | POA: Diagnosis not present

## 2020-03-21 DIAGNOSIS — R634 Abnormal weight loss: Secondary | ICD-10-CM | POA: Diagnosis not present

## 2020-03-21 DIAGNOSIS — I10 Essential (primary) hypertension: Secondary | ICD-10-CM | POA: Diagnosis not present

## 2020-04-03 DIAGNOSIS — L300 Nummular dermatitis: Secondary | ICD-10-CM | POA: Diagnosis not present

## 2020-04-03 DIAGNOSIS — B356 Tinea cruris: Secondary | ICD-10-CM | POA: Diagnosis not present

## 2020-04-03 DIAGNOSIS — Z85828 Personal history of other malignant neoplasm of skin: Secondary | ICD-10-CM | POA: Diagnosis not present

## 2020-04-05 DIAGNOSIS — E539 Vitamin B deficiency, unspecified: Secondary | ICD-10-CM | POA: Diagnosis not present

## 2020-05-06 DIAGNOSIS — E539 Vitamin B deficiency, unspecified: Secondary | ICD-10-CM | POA: Diagnosis not present

## 2020-05-10 DIAGNOSIS — E1140 Type 2 diabetes mellitus with diabetic neuropathy, unspecified: Secondary | ICD-10-CM | POA: Diagnosis not present

## 2020-05-10 DIAGNOSIS — Z7984 Long term (current) use of oral hypoglycemic drugs: Secondary | ICD-10-CM | POA: Diagnosis not present

## 2020-05-10 DIAGNOSIS — I10 Essential (primary) hypertension: Secondary | ICD-10-CM | POA: Diagnosis not present

## 2020-05-10 DIAGNOSIS — E11319 Type 2 diabetes mellitus with unspecified diabetic retinopathy without macular edema: Secondary | ICD-10-CM | POA: Diagnosis not present

## 2020-06-06 DIAGNOSIS — E539 Vitamin B deficiency, unspecified: Secondary | ICD-10-CM | POA: Diagnosis not present

## 2020-06-07 DIAGNOSIS — M19042 Primary osteoarthritis, left hand: Secondary | ICD-10-CM | POA: Diagnosis not present

## 2020-06-07 DIAGNOSIS — M65312 Trigger thumb, left thumb: Secondary | ICD-10-CM | POA: Diagnosis not present

## 2020-07-09 DIAGNOSIS — E539 Vitamin B deficiency, unspecified: Secondary | ICD-10-CM | POA: Diagnosis not present

## 2020-07-12 DIAGNOSIS — M65332 Trigger finger, left middle finger: Secondary | ICD-10-CM | POA: Diagnosis not present

## 2020-07-12 DIAGNOSIS — M65312 Trigger thumb, left thumb: Secondary | ICD-10-CM | POA: Diagnosis not present

## 2020-07-16 DIAGNOSIS — B356 Tinea cruris: Secondary | ICD-10-CM | POA: Diagnosis not present

## 2020-07-16 DIAGNOSIS — D485 Neoplasm of uncertain behavior of skin: Secondary | ICD-10-CM | POA: Diagnosis not present

## 2020-07-16 DIAGNOSIS — Z85828 Personal history of other malignant neoplasm of skin: Secondary | ICD-10-CM | POA: Diagnosis not present

## 2020-07-16 DIAGNOSIS — C44619 Basal cell carcinoma of skin of left upper limb, including shoulder: Secondary | ICD-10-CM | POA: Diagnosis not present

## 2020-07-30 ENCOUNTER — Encounter (INDEPENDENT_AMBULATORY_CARE_PROVIDER_SITE_OTHER): Payer: Self-pay | Admitting: Otolaryngology

## 2020-07-30 ENCOUNTER — Ambulatory Visit (INDEPENDENT_AMBULATORY_CARE_PROVIDER_SITE_OTHER): Payer: Medicare Other | Admitting: Otolaryngology

## 2020-07-30 ENCOUNTER — Other Ambulatory Visit: Payer: Self-pay

## 2020-07-30 VITALS — Temp 96.1°F

## 2020-07-30 DIAGNOSIS — H6123 Impacted cerumen, bilateral: Secondary | ICD-10-CM

## 2020-07-30 NOTE — Progress Notes (Signed)
HPI: Andre Mills is a 86 y.o. male who presents for evaluation of ear wax in his ears.  He used to see Dr. Ernesto Rutherford on a yearly basis to have his ears cleaned..  Past Medical History:  Diagnosis Date  . Anemia   . Arthritis   . Diabetes mellitus without complication ([place])   . GERD (gastroesophageal reflux disease)   . High cholesterol   . Hypertension   . Wears dentures    TOP  . Wears glasses    Past Surgical History:  Procedure Laterality Date  . COLONOSCOPY    . COLONOSCOPY W/ BIOPSIES AND POLYPECTOMY    . EYE SURGERY     both cataracts  . KNEE ARTHROSCOPY     left  . TONSILLECTOMY    . TRIGGER FINGER RELEASE Right 06/28/2014   Procedure: RELEASE A-1 PULLEY POSSIBLE EXCISION ONE SLIP SUPERFICIALIS  RIGHT MIDDLE FINGER ;  Surgeon: Daryll Brod, MD;  Location: [facility];  Service: Orthopedics;  Laterality: Right;   Social History   Socioeconomic History  . Marital status: Married    Spouse name: Not on file  . Number of children: Not on file  . Years of education: Not on file  . Highest education level: Not on file  Occupational History  . Not on file  Tobacco Use  . Smoking status: Former Smoker    Quit date: 06/23/1959    Years since quitting: 61.1  . Smokeless tobacco: Never Used  Substance and Sexual Activity  . Alcohol use: No  . Drug use: No  . Sexual activity: Not on file  Other Topics Concern  . Not on file  Social History Narrative  . Not on file   Social Determinants of Health   Financial Resource Strain:   . Difficulty of Paying Living Expenses: Not on file  Food Insecurity:   . Worried About Charity fundraiser in the Last Year: Not on file  . Ran Out of Food in the Last Year: Not on file  Transportation Needs:   . Lack of Transportation (Medical): Not on file  . Lack of Transportation (Non-Medical): Not on file  Physical Activity:   . Days of Exercise per Week: Not on file  . Minutes of Exercise per Session: Not on file   Stress:   . Feeling of Stress : Not on file  Social Connections:   . Frequency of Communication with Friends and Family: Not on file  . Frequency of Social Gatherings with Friends and Family: Not on file  . Attends Religious Services: Not on file  . Active Member of Clubs or Organizations: Not on file  . Attends Archivist Meetings: Not on file  . Marital Status: Not on file   No family history on file. Allergies  Allergen Reactions  . Flexeril [Cyclobenzaprine]   . Limbitrol Ds [Chlordiazepoxide-Amitriptyline]   . Macrodantin [Nitrofurantoin Macrocrystal]   . Mobic [Meloxicam]   . Septra [Sulfamethoxazole-Trimethoprim]    Prior to Admission medications   Medication Sig Start Date End Date Taking? Authorizing Provider  aspirin EC 81 MG tablet Take 81 mg by mouth daily.   Yes [provider]  gabapentin (NEURONTIN) 300 MG capsule Take 300 mg by mouth 2 (two) times daily.   Yes [provider]  glimepiride (AMARYL) 1 MG tablet Take 0.5 mg by mouth daily before breakfast.   Yes [provider]  ibuprofen (ADVIL,MOTRIN) 200 MG tablet Take 400 mg by mouth every 6 (  six) hours as needed for pain.   Yes [provider]  lisinopril-hydrochlorothiazide (PRINZIDE,ZESTORETIC) 20-25 MG per tablet Take 1 tablet by mouth daily.   Yes [provider]  metFORMIN (GLUCOPHAGE) 500 MG tablet Take 500 mg by mouth 2 (two) times daily with a meal.   Yes [provider]  omeprazole (PRILOSEC) 20 MG capsule Take 20 mg by mouth daily.   Yes [provider]  pioglitazone (ACTOS) 45 MG tablet Take 45 mg by mouth daily.   Yes [provider]  PRESCRIPTION MEDICATION Place 1 drop into both eyes 2 (two) times daily. Eye drops for glaucoma   Yes [provider]  simvastatin (ZOCOR) 80 MG tablet Take 40 mg by mouth at bedtime.   Yes [provider]  tamsulosin (FLOMAX) 0.4 MG CAPS capsule Take 0.4 mg by mouth daily.    Yes [provider]  timolol (BLOCADREN) 5 MG tablet Take 5 mg by mouth 2 (two) times daily.   Yes [provider]  traMADol (ULTRAM) 50 MG tablet Take by mouth every 6 (six) hours as needed.   Yes [provider]     Positive ROS: Otherwise negative  All other systems have been reviewed and were otherwise negative with the exception of those mentioned in the HPI and as above.  Physical Exam: Constitutional: Alert, well-appearing, no acute distress Ears: External ears without lesions or tenderness. Ear canals he has narrowed ear canals bilaterally.  Ear canals were cleaned with suction forceps and curettes.  TMs are clear bilaterally.. Nasal: External nose without lesions. Clear nasal passages Oral: Oropharynx clear. Neck: No palpable adenopathy or masses Respiratory: Breathing comfortably  Skin: No facial/neck lesions or rash noted.  Cerumen impaction removal  Date/Time: 07/30/2020 6:08 PM Performed by: Rozetta Nunnery, MD Authorized by: Rozetta Nunnery, MD   Consent:    Consent obtained:  Verbal   Consent given by:  Patient   Risks discussed:  Pain and bleeding Procedure details:    Location:  L ear and R ear   Procedure type: curette, suction and forceps   Post-procedure details:    Inspection:  TM intact and canal normal   Hearing quality:  Improved   Patient tolerance of procedure:  Tolerated well, no immediate complications Comments:     TMs are clear bilaterally    Assessment: Bilateral cerumen impactions  Plan: He will follow up on annual basis for cleaning.  Radene Journey, MD

## 2020-08-01 ENCOUNTER — Telehealth: Payer: Self-pay | Admitting: Internal Medicine

## 2020-08-01 NOTE — Telephone Encounter (Signed)
    Dr Ronnald Ramp, please confirm you agreed to see Mr Giraud and his spouse as new patients. Their daughter Delvonte Berenson states you agreed

## 2020-08-09 DIAGNOSIS — E539 Vitamin B deficiency, unspecified: Secondary | ICD-10-CM | POA: Diagnosis not present

## 2020-08-15 ENCOUNTER — Ambulatory Visit (INDEPENDENT_AMBULATORY_CARE_PROVIDER_SITE_OTHER): Payer: Medicare Other | Admitting: Internal Medicine

## 2020-08-15 ENCOUNTER — Encounter: Payer: Self-pay | Admitting: Internal Medicine

## 2020-08-15 ENCOUNTER — Other Ambulatory Visit: Payer: Self-pay

## 2020-08-15 VITALS — BP 140/82 | HR 64 | Temp 98.2°F | Resp 16 | Ht 67.0 in | Wt 153.0 lb

## 2020-08-15 DIAGNOSIS — E119 Type 2 diabetes mellitus without complications: Secondary | ICD-10-CM

## 2020-08-15 DIAGNOSIS — I10 Essential (primary) hypertension: Secondary | ICD-10-CM

## 2020-08-15 DIAGNOSIS — Z23 Encounter for immunization: Secondary | ICD-10-CM | POA: Diagnosis not present

## 2020-08-15 DIAGNOSIS — D510 Vitamin B12 deficiency anemia due to intrinsic factor deficiency: Secondary | ICD-10-CM

## 2020-08-15 DIAGNOSIS — K219 Gastro-esophageal reflux disease without esophagitis: Secondary | ICD-10-CM | POA: Insufficient documentation

## 2020-08-15 NOTE — Progress Notes (Signed)
Subjective:  Patient ID: Andre Mills, male    DOB: 1925-02-11  Age: 86 y.o. MRN: [id number]  CC: Hypertension  This visit occurred during the SARS-CoV-2 public health emergency.  Safety protocols were in place, including screening questions prior to the visit, additional usage of staff PPE, and extensive cleaning of exam room while observing appropriate contact time as indicated for disinfecting solutions.    HPI Andre Mills presents for establishing as a primary care patient.  He tells me his blood pressure has been well controlled.  He denies any recent episodes of headache, blurred vision, chest pain, or shortness of breath.  He is with his wife and 2 daughters today.  They think he is doing well.  He has recently had lab work done elsewhere but I do not have any copies of that today.  History Andre Mills has a past medical history of Anemia, Arthritis, Diabetes mellitus without complication ([place]), GERD (gastroesophageal reflux disease), High cholesterol, Hypertension, Wears dentures, and Wears glasses.   He has a past surgical history that includes Tonsillectomy; Eye surgery; Knee arthroscopy; Colonoscopy w/ biopsies and polypectomy; Colonoscopy; and Trigger finger release (Right, 06/28/2014).   His family history is not on file.He reports that he quit smoking about 61 years ago. He has never used smokeless tobacco. He reports that he does not drink alcohol and does not use drugs.  Outpatient Medications Prior to Visit  Medication Sig Dispense Refill  . aspirin EC 81 MG tablet Take 81 mg by mouth daily.    Marland Kitchen gabapentin (NEURONTIN) 300 MG capsule Take 300 mg by mouth 2 (two) times daily.    Marland Kitchen lisinopril-hydrochlorothiazide (PRINZIDE,ZESTORETIC) 20-25 MG per tablet Take 1 tablet by mouth daily.    . metFORMIN (GLUCOPHAGE) 500 MG tablet Take 500 mg by mouth 2 (two) times daily with a meal.    . omeprazole (PRILOSEC) 20 MG capsule Take 20 mg by mouth daily.    . pioglitazone (ACTOS) 30 MG  tablet Take 30 mg by mouth daily.    Marland Kitchen PRESCRIPTION MEDICATION Place 1 drop into both eyes 2 (two) times daily. Eye drops for glaucoma    . simvastatin (ZOCOR) 40 MG tablet Take 40 mg by mouth daily.    . tamsulosin (FLOMAX) 0.4 MG CAPS capsule Take 0.4 mg by mouth daily.    . timolol (BLOCADREN) 5 MG tablet Take 5 mg by mouth 2 (two) times daily.    . traMADol (ULTRAM) 50 MG tablet Take by mouth every 6 (six) hours as needed.    Marland Kitchen glimepiride (AMARYL) 1 MG tablet Take 0.5 mg by mouth daily before breakfast.    . ibuprofen (ADVIL,MOTRIN) 200 MG tablet Take 400 mg by mouth every 6 (six) hours as needed for pain.    . pioglitazone (ACTOS) 45 MG tablet Take 30 mg by mouth daily.     . simvastatin (ZOCOR) 80 MG tablet Take 40 mg by mouth at bedtime.     No facility-administered medications prior to visit.    ROS Review of Systems  Constitutional: Negative for appetite change, diaphoresis and fatigue.  HENT: Negative.   Eyes: Negative.   Respiratory: Negative for cough, chest tightness, shortness of breath and wheezing.   Cardiovascular: Negative for chest pain, palpitations and leg swelling.  Gastrointestinal: Negative for abdominal pain, diarrhea, nausea and vomiting.  Endocrine: Negative.   Genitourinary: Negative.   Musculoskeletal: Negative for arthralgias and myalgias.  Skin: Negative.  Negative for rash.  Neurological: Negative for dizziness, weakness  and light-headedness.  Hematological: Negative.   Psychiatric/Behavioral: Negative.     Objective:  BP 140/82   Pulse 64   Temp 98.2 F (36.8 C) (Oral)   Resp 16   Ht 5\' 7"  (1.702 m)   Wt 153 lb (69.4 kg)   SpO2 98%   BMI 23.96 kg/m   Physical Exam Vitals reviewed.  Constitutional:      Appearance: Normal appearance.  HENT:     Nose: Nose normal.     Mouth/Throat:     Mouth: Mucous membranes are moist.  Eyes:     General: No scleral icterus.    Conjunctiva/sclera: Conjunctivae normal.  Cardiovascular:     Rate  and Rhythm: Normal rate and regular rhythm.     Heart sounds: No murmur heard.   Pulmonary:     Effort: Pulmonary effort is normal.     Breath sounds: No stridor. No wheezing, rhonchi or rales.  Abdominal:     General: Abdomen is flat.     Palpations: There is no mass.     Tenderness: There is no abdominal tenderness. There is no guarding.  Musculoskeletal:        General: Normal range of motion.     Cervical back: Neck supple.     Right lower leg: No edema.     Left lower leg: No edema.  Lymphadenopathy:     Cervical: No cervical adenopathy.  Skin:    General: Skin is warm and dry.     Coloration: Skin is not pale.  Neurological:     General: No focal deficit present.     Mental Status: He is alert.  Psychiatric:        Mood and Affect: Mood normal.        Behavior: Behavior normal.      Lab Results  Component Value Date   WBC 5.9 05/15/2009   HGB 10.2 (L) 06/28/2014   HCT 36.0 (L) 01/20/2013   PLT 130 (L) 05/15/2009   GLUCOSE 113 (H) 01/20/2013   CHOL  05/15/2009    125        ATP III CLASSIFICATION:  <200     mg/dL   Desirable  200-239  mg/dL   Borderline High  >=240    mg/dL   High          TRIG 188 (H) 05/15/2009   HDL 30 (L) 05/15/2009   LDLCALC  05/15/2009    57        Total Cholesterol/HDL:CHD Risk Coronary Heart Disease Risk Table                     Men   Women  1/2 Average Risk   3.4   3.3  Average Risk       5.0   4.4  2 X Average Risk   9.6   7.1  3 X Average Risk  23.4   11.0        Use the calculated Patient Ratio above and the CHD Risk Table to determine the patient's CHD Risk.        ATP III CLASSIFICATION (LDL):  <100     mg/dL   Optimal  100-129  mg/dL   Near or Above                    Optimal  130-159  mg/dL   Borderline  160-189  mg/dL   High  >190     mg/dL  Very High   ALT 12 05/14/2009   AST 21 05/14/2009   NA 139 01/20/2013   K 5.2 (H) 01/20/2013   CL 107 01/20/2013   CREATININE 0.80 01/20/2013   BUN 34 (H) 01/20/2013    CO2 26 05/15/2009   INR 1.1 05/14/2009    Assessment & Plan:   Andre Mills was seen today for hypertension.  Diagnoses and all orders for this visit:  Flu vaccine need -     Flu Vaccine QUAD High Dose(Fluad)  Primary hypertension- His blood pressure is well controlled.  I requested labs from his previous PCP to see what his most recent renal function electrolytes were.  Diabetes mellitus without complication ([place])- Based on his symptoms his blood sugar is adequately well controlled.  I have requested labs from his previous PCP to see what his last A1c was. -     HM Diabetes Foot Exam  Gastroesophageal reflux disease without esophagitis- His symptoms are well controlled with the PPI.   I have discontinued Jeneen Rinks C. Westerlund's ibuprofen and glimepiride. I am also having him maintain his lisinopril-hydrochlorothiazide, omeprazole, aspirin EC, metFORMIN, gabapentin, PRESCRIPTION MEDICATION, traMADol, timolol, tamsulosin, pioglitazone, and simvastatin.  No orders of the defined types were placed in this encounter.    Follow-up: Return in about 4 months (around 12/16/2020).  Scarlette Calico, MD

## 2020-08-15 NOTE — Patient Instructions (Signed)
Type 2 Diabetes Mellitus, Diagnosis, Adult Type 2 diabetes (type 2 diabetes mellitus) is a long-term (chronic) disease. In type 2 diabetes, one or both of these problems may be present:  The pancreas does not make enough of a hormone called insulin.  Cells in the body do not respond properly to insulin that the body makes (insulin resistance). Normally, insulin allows blood sugar (glucose) to enter cells in the body. The cells use glucose for energy. Insulin resistance or lack of insulin causes excess glucose to build up in the blood instead of going into cells. As a result, high blood glucose (hyperglycemia) develops. What increases the risk? The following factors may make you more likely to develop type 2 diabetes:  Having a family member with type 2 diabetes.  Being overweight or obese.  Having an inactive (sedentary) lifestyle.  Having been diagnosed with insulin resistance.  Having a history of prediabetes, gestational diabetes, or polycystic ovary syndrome (PCOS).  Being of American-Indian, African-American, Hispanic/Latino, or Asian/Pacific Islander descent. What are the signs or symptoms? In the early stage of this condition, you may not have symptoms. Symptoms develop slowly and may include:  Increased thirst (polydipsia).  Increased hunger(polyphagia).  Increased urination (polyuria).  Increased urination during the night (nocturia).  Unexplained weight loss.  Frequent infections that keep coming back (recurring).  Fatigue.  Weakness.  Vision changes, such as blurry vision.  Cuts or bruises that are slow to heal.  Tingling or numbness in the hands or feet.  Dark patches on the skin (acanthosis nigricans). How is this diagnosed? This condition is diagnosed based on your symptoms, your medical history, a physical exam, and your blood glucose level. Your blood glucose may be checked with one or more of the following blood tests:  A fasting blood glucose (FBG)  test. You will not be allowed to eat (you will fast) for 8 hours or longer before a blood sample is taken.  A random blood glucose test. This test checks blood glucose at any time of day regardless of when you ate.  An A1c (hemoglobin A1c) blood test. This test provides information about blood glucose control over the previous 2-3 months.  An oral glucose tolerance test (OGTT). This test measures your blood glucose at two times: ? After fasting. This is your baseline blood glucose level. ? Two hours after drinking a beverage that contains glucose. You may be diagnosed with type 2 diabetes if:  Your FBG level is 126 mg/dL (7.0 mmol/L) or higher.  Your random blood glucose level is 200 mg/dL (11.1 mmol/L) or higher.  Your A1c level is 6.5% or higher.  Your OGTT result is higher than 200 mg/dL (11.1 mmol/L). These blood tests may be repeated to confirm your diagnosis. How is this treated? Your treatment may be managed by a specialist called an endocrinologist. Type 2 diabetes may be treated by following instructions from your health care provider about:  Making diet and lifestyle changes. This may include: ? Following an individualized nutrition plan that is developed by a diet and nutrition specialist (registered dietitian). ? Exercising regularly. ? Finding ways to manage stress.  Checking your blood glucose level as often as told.  Taking diabetes medicines or insulin daily. This helps to keep your blood glucose levels in the healthy range. ? If you use insulin, you may need to adjust the dosage depending on how physically active you are and what foods you eat. Your health care provider will tell you how to adjust your dosage.    Taking medicines to help prevent complications from diabetes, such as: ? Aspirin. ? Medicine to lower cholesterol. ? Medicine to control blood pressure. Your health care provider will set individualized treatment goals for you. Your goals will be based on  your age, other medical conditions you have, and how you respond to diabetes treatment. Generally, the goal of treatment is to maintain the following blood glucose levels:  Before meals (preprandial): 80-130 mg/dL (4.4-7.2 mmol/L).  After meals (postprandial): below 180 mg/dL (10 mmol/L).  A1c level: less than 7%. Follow these instructions at home: Questions to ask your health care provider  Consider asking the following questions: ? Do I need to meet with a diabetes educator? ? Where can I find a support group for people with diabetes? ? What equipment will I need to manage my diabetes at home? ? What diabetes medicines do I need, and when should I take them? ? How often do I need to check my blood glucose? ? What number can I call if I have questions? ? When is my next appointment? General instructions  Take over-the-counter and prescription medicines only as told by your health care provider.  Keep all follow-up visits as told by your health care provider. This is important.  For more information about diabetes, visit: ? American Diabetes Association (ADA): www.diabetes.org ? American Association of Diabetes Educators (AADE): www.diabeteseducator.org Contact a health care provider if:  Your blood glucose is at or above 240 mg/dL (13.3 mmol/L) for 2 days in a row.  You have been sick or have had a fever for 2 days or longer, and you are not getting better.  You have any of the following problems for more than 6 hours: ? You cannot eat or drink. ? You have nausea and vomiting. ? You have diarrhea. Get help right away if:  Your blood glucose is lower than 54 mg/dL (3.0 mmol/L).  You become confused or you have trouble thinking clearly.  You have difficulty breathing.  You have moderate or large ketone levels in your urine. Summary  Type 2 diabetes (type 2 diabetes mellitus) is a long-term (chronic) disease. In type 2 diabetes, the pancreas does not make enough of a  hormone called insulin, or cells in the body do not respond properly to insulin that the body makes (insulin resistance).  This condition is treated by making diet and lifestyle changes and taking diabetes medicines or insulin.  Your health care provider will set individualized treatment goals for you. Your goals will be based on your age, other medical conditions you have, and how you respond to diabetes treatment.  Keep all follow-up visits as told by your health care provider. This is important. This information is not intended to replace advice given to you by your health care provider. Make sure you discuss any questions you have with your health care provider. Document Revised: 12/24/2017 Document Reviewed: 11/29/2015 Elsevier Patient Education  2020 Elsevier Inc.  

## 2020-08-19 ENCOUNTER — Encounter (INDEPENDENT_AMBULATORY_CARE_PROVIDER_SITE_OTHER): Payer: Medicare Other | Admitting: Ophthalmology

## 2020-08-23 ENCOUNTER — Encounter: Payer: Self-pay | Admitting: Internal Medicine

## 2020-08-26 DIAGNOSIS — M65312 Trigger thumb, left thumb: Secondary | ICD-10-CM | POA: Diagnosis not present

## 2020-08-26 DIAGNOSIS — M65332 Trigger finger, left middle finger: Secondary | ICD-10-CM | POA: Diagnosis not present

## 2020-09-03 ENCOUNTER — Other Ambulatory Visit: Payer: Self-pay

## 2020-09-03 ENCOUNTER — Encounter (INDEPENDENT_AMBULATORY_CARE_PROVIDER_SITE_OTHER): Payer: Medicare Other | Admitting: Ophthalmology

## 2020-09-03 DIAGNOSIS — H43813 Vitreous degeneration, bilateral: Secondary | ICD-10-CM | POA: Diagnosis not present

## 2020-09-03 DIAGNOSIS — I10 Essential (primary) hypertension: Secondary | ICD-10-CM | POA: Diagnosis not present

## 2020-09-03 DIAGNOSIS — E113313 Type 2 diabetes mellitus with moderate nonproliferative diabetic retinopathy with macular edema, bilateral: Secondary | ICD-10-CM

## 2020-09-03 DIAGNOSIS — H35033 Hypertensive retinopathy, bilateral: Secondary | ICD-10-CM

## 2020-09-03 DIAGNOSIS — E11311 Type 2 diabetes mellitus with unspecified diabetic retinopathy with macular edema: Secondary | ICD-10-CM

## 2020-09-09 DIAGNOSIS — Z961 Presence of intraocular lens: Secondary | ICD-10-CM | POA: Diagnosis not present

## 2020-09-09 DIAGNOSIS — E113313 Type 2 diabetes mellitus with moderate nonproliferative diabetic retinopathy with macular edema, bilateral: Secondary | ICD-10-CM | POA: Diagnosis not present

## 2020-09-09 DIAGNOSIS — H401133 Primary open-angle glaucoma, bilateral, severe stage: Secondary | ICD-10-CM | POA: Diagnosis not present

## 2020-09-09 DIAGNOSIS — H04123 Dry eye syndrome of bilateral lacrimal glands: Secondary | ICD-10-CM | POA: Diagnosis not present

## 2020-09-16 ENCOUNTER — Other Ambulatory Visit: Payer: Self-pay

## 2020-09-16 ENCOUNTER — Ambulatory Visit: Payer: Medicare Other

## 2020-09-16 DIAGNOSIS — E538 Deficiency of other specified B group vitamins: Secondary | ICD-10-CM

## 2020-09-16 MED ORDER — CYANOCOBALAMIN 1000 MCG/ML IJ SOLN
1000.0000 ug | Freq: Once | INTRAMUSCULAR | Status: DC
Start: 2020-09-16 — End: 2020-12-16

## 2020-09-16 NOTE — Progress Notes (Signed)
Vit V85 given w/o complications.

## 2020-10-06 DIAGNOSIS — Z23 Encounter for immunization: Secondary | ICD-10-CM | POA: Diagnosis not present

## 2020-10-07 DIAGNOSIS — M65332 Trigger finger, left middle finger: Secondary | ICD-10-CM | POA: Diagnosis not present

## 2020-10-17 ENCOUNTER — Ambulatory Visit (INDEPENDENT_AMBULATORY_CARE_PROVIDER_SITE_OTHER): Payer: Medicare Other

## 2020-10-17 ENCOUNTER — Other Ambulatory Visit: Payer: Self-pay

## 2020-10-17 DIAGNOSIS — E538 Deficiency of other specified B group vitamins: Secondary | ICD-10-CM | POA: Diagnosis not present

## 2020-10-17 DIAGNOSIS — D510 Vitamin B12 deficiency anemia due to intrinsic factor deficiency: Secondary | ICD-10-CM | POA: Insufficient documentation

## 2020-10-17 MED ORDER — CYANOCOBALAMIN 1000 MCG/ML IJ SOLN
1000.0000 ug | Freq: Once | INTRAMUSCULAR | Status: AC
  Administered 2020-10-17: 1000 ug via INTRAMUSCULAR

## 2020-10-17 NOTE — Progress Notes (Signed)
Pt here for monthly B12 injection per Dr Ronnald Ramp.  B12 1031mcg given IM left deltoid and pt tolerated injection well.  Pt to schedule next b12 upon check out.

## 2020-10-23 DIAGNOSIS — B356 Tinea cruris: Secondary | ICD-10-CM | POA: Diagnosis not present

## 2020-10-23 DIAGNOSIS — Z85828 Personal history of other malignant neoplasm of skin: Secondary | ICD-10-CM | POA: Diagnosis not present

## 2020-11-04 ENCOUNTER — Other Ambulatory Visit: Payer: Self-pay

## 2020-11-04 ENCOUNTER — Encounter: Payer: Self-pay | Admitting: Podiatry

## 2020-11-04 ENCOUNTER — Ambulatory Visit (INDEPENDENT_AMBULATORY_CARE_PROVIDER_SITE_OTHER): Payer: Medicare Other

## 2020-11-04 ENCOUNTER — Ambulatory Visit (INDEPENDENT_AMBULATORY_CARE_PROVIDER_SITE_OTHER): Payer: Medicare Other | Admitting: Podiatry

## 2020-11-04 DIAGNOSIS — M79672 Pain in left foot: Secondary | ICD-10-CM

## 2020-11-04 DIAGNOSIS — M109 Gout, unspecified: Secondary | ICD-10-CM | POA: Diagnosis not present

## 2020-11-04 DIAGNOSIS — M779 Enthesopathy, unspecified: Secondary | ICD-10-CM | POA: Diagnosis not present

## 2020-11-04 MED ORDER — TRIAMCINOLONE ACETONIDE 10 MG/ML IJ SUSP
10.0000 mg | Freq: Once | INTRAMUSCULAR | Status: AC
  Administered 2020-11-04: 16:00:00 10 mg

## 2020-11-06 ENCOUNTER — Other Ambulatory Visit: Payer: Self-pay | Admitting: Podiatry

## 2020-11-06 ENCOUNTER — Ambulatory Visit: Payer: Medicare Other | Admitting: Podiatry

## 2020-11-06 DIAGNOSIS — M779 Enthesopathy, unspecified: Secondary | ICD-10-CM

## 2020-11-06 NOTE — Progress Notes (Signed)
Subjective:   Patient ID: Andre Mills, male   DOB: 86 y.o.   MRN: [id number]   HPI Patient presents stating that he has developed acute inflammation around his big toe joint left has been very sore and making it hard for him to walk.  Its just occurred over the last 3 to 5 days   ROS      Objective:  Physical Exam  Inflammatory capsulitis first MPJ left with redness and swelling around the joint surface     Assessment:  Probability for gout of the first MPJ left with inflammatory capsulitis     Plan:  H&P reviewed condition and explained gout to him giving him education on foods to avoid and the condition itself.  Sterile prep done and injected around the first MPJ 3 mg Kenalog 5 mg Xylocaine  X-rays indicate that there is some arthritis around the big toe joint left moderate bunion deformity no indications of osteolysis

## 2020-11-20 ENCOUNTER — Ambulatory Visit (INDEPENDENT_AMBULATORY_CARE_PROVIDER_SITE_OTHER): Payer: Medicare Other

## 2020-11-20 ENCOUNTER — Other Ambulatory Visit: Payer: Self-pay

## 2020-11-20 DIAGNOSIS — E538 Deficiency of other specified B group vitamins: Secondary | ICD-10-CM

## 2020-11-20 MED ORDER — CYANOCOBALAMIN 1000 MCG/ML IJ SOLN
1000.0000 ug | INTRAMUSCULAR | Status: DC
  Administered 2020-11-20: 1000 ug via INTRAMUSCULAR

## 2020-11-20 NOTE — Progress Notes (Signed)
Pt here for monthly B12 injection per Dr Ronnald Ramp.  B12 1058mcg given IM, and pt tolerated injection well.  Next B12 injection scheduled for 12/20/20

## 2020-12-16 ENCOUNTER — Encounter: Payer: Self-pay | Admitting: Internal Medicine

## 2020-12-16 ENCOUNTER — Ambulatory Visit (INDEPENDENT_AMBULATORY_CARE_PROVIDER_SITE_OTHER): Payer: Medicare Other | Admitting: Internal Medicine

## 2020-12-16 ENCOUNTER — Other Ambulatory Visit: Payer: Self-pay

## 2020-12-16 VITALS — BP 122/74 | HR 64 | Temp 98.2°F | Ht 67.0 in | Wt 153.0 lb

## 2020-12-16 DIAGNOSIS — E119 Type 2 diabetes mellitus without complications: Secondary | ICD-10-CM

## 2020-12-16 DIAGNOSIS — D510 Vitamin B12 deficiency anemia due to intrinsic factor deficiency: Secondary | ICD-10-CM

## 2020-12-16 DIAGNOSIS — N1832 Chronic kidney disease, stage 3b: Secondary | ICD-10-CM | POA: Insufficient documentation

## 2020-12-16 DIAGNOSIS — I10 Essential (primary) hypertension: Secondary | ICD-10-CM

## 2020-12-16 LAB — CBC WITH DIFFERENTIAL/PLATELET
Basophils Absolute: 0.1 10*3/uL (ref 0.0–0.1)
Basophils Relative: 1.2 % (ref 0.0–3.0)
Eosinophils Absolute: 0.2 10*3/uL (ref 0.0–0.7)
Eosinophils Relative: 4.3 % (ref 0.0–5.0)
HCT: 37.3 % — ABNORMAL LOW (ref 39.0–52.0)
Hemoglobin: 12.4 g/dL — ABNORMAL LOW (ref 13.0–17.0)
Lymphocytes Relative: 35.4 % (ref 12.0–46.0)
Lymphs Abs: 1.8 10*3/uL (ref 0.7–4.0)
MCHC: 33.2 g/dL (ref 30.0–36.0)
MCV: 93.2 fl (ref 78.0–100.0)
Monocytes Absolute: 0.4 10*3/uL (ref 0.1–1.0)
Monocytes Relative: 8.6 % (ref 3.0–12.0)
Neutro Abs: 2.6 10*3/uL (ref 1.4–7.7)
Neutrophils Relative %: 50.5 % (ref 43.0–77.0)
Platelets: 165 10*3/uL (ref 150.0–400.0)
RBC: 4 Mil/uL — ABNORMAL LOW (ref 4.22–5.81)
RDW: 14.7 % (ref 11.5–15.5)
WBC: 5.2 10*3/uL (ref 4.0–10.5)

## 2020-12-16 LAB — BASIC METABOLIC PANEL
BUN: 21 mg/dL (ref 6–23)
CO2: 28 mEq/L (ref 19–32)
Calcium: 8.9 mg/dL (ref 8.4–10.5)
Chloride: 103 mEq/L (ref 96–112)
Creatinine, Ser: 1.05 mg/dL (ref 0.40–1.50)
GFR: 61.1 mL/min (ref >60.00)
Glucose, Bld: 241 mg/dL — ABNORMAL HIGH (ref 70–99)
Potassium: 4.8 mEq/L (ref 3.5–5.1)
Sodium: 137 mEq/L (ref 135–145)

## 2020-12-16 LAB — HEMOGLOBIN A1C: Hgb A1c MFr Bld: 9.3 % — ABNORMAL HIGH (ref 4.6–6.5)

## 2020-12-16 LAB — FOLATE: Folate: 16.1 ng/mL (ref >5.9)

## 2020-12-16 MED ORDER — DAPAGLIFLOZIN PROPANEDIOL 10 MG PO TABS: 10.0000 mg | ORAL_TABLET | Freq: Every day | ORAL | 1 refills | Status: DC

## 2020-12-16 MED ORDER — DEXCOM G6 SENSOR MISC: 1.0000 | Freq: Every day | 5 refills | Status: DC

## 2020-12-16 MED ORDER — SOLIQUA 100-33 UNT-MCG/ML ~~LOC~~ SOPN
20.0000 [IU] | PEN_INJECTOR | Freq: Every day | SUBCUTANEOUS | 1 refills | Status: DC
Start: 2020-12-16 — End: 2020-12-20

## 2020-12-16 MED ORDER — GVOKE HYPOPEN 2-PACK 1 MG/0.2ML ~~LOC~~ SOAJ: 1.0000 | Freq: Every day | SUBCUTANEOUS | 5 refills | Status: DC | PRN

## 2020-12-16 MED ORDER — INSULIN PEN NEEDLE 32G X 6 MM MISC
1.0000 | Freq: Every day | 1 refills | Status: DC
Start: 2020-12-16 — End: 2021-03-20

## 2020-12-16 MED ORDER — DEXCOM G6 TRANSMITTER MISC: 1.0000 | Freq: Every day | 5 refills | Status: DC

## 2020-12-16 NOTE — Progress Notes (Signed)
Subjective:  Patient ID: Andre Mills, male    DOB: 1925/05/08  Age: 87 y.o. MRN: [id number]  CC: Diabetes and Anemia  This visit occurred during the SARS-CoV-2 public health emergency.  Safety protocols were in place, including screening questions prior to the visit, additional usage of staff PPE, and extensive cleaning of exam room while observing appropriate contact time as indicated for disinfecting solutions.    HPI Andre Mills presents for f/up -   He tells me he checks his blood sugar about once a week and it is always above 200.  He is not currently taking any glycemic agents.  He denies polys.  Outpatient Medications Prior to Visit  Medication Sig Dispense Refill  . aspirin EC 81 MG tablet Take 81 mg by mouth daily.    Marland Kitchen gabapentin (NEURONTIN) 300 MG capsule Take 300 mg by mouth 2 (two) times daily.    Marland Kitchen omeprazole (PRILOSEC) 20 MG capsule Take 20 mg by mouth daily.    . pioglitazone (ACTOS) 30 MG tablet Take 30 mg by mouth daily.    Marland Kitchen PRESCRIPTION MEDICATION Place 1 drop into both eyes 2 (two) times daily. Eye drops for glaucoma    . simvastatin (ZOCOR) 40 MG tablet Take 40 mg by mouth daily.    . tamsulosin (FLOMAX) 0.4 MG CAPS capsule Take 0.4 mg by mouth daily.    . timolol (BLOCADREN) 5 MG tablet Take 5 mg by mouth 2 (two) times daily.    Marland Kitchen lisinopril-hydrochlorothiazide (PRINZIDE,ZESTORETIC) 20-25 MG per tablet Take 1 tablet by mouth daily.    . metFORMIN (GLUCOPHAGE) 500 MG tablet Take 500 mg by mouth 2 (two) times daily with a meal.    . traMADol (ULTRAM) 50 MG tablet Take by mouth every 6 (six) hours as needed.    . cyanocobalamin ((VITAMIN B-12)) injection 1,000 mcg     . cyanocobalamin ((VITAMIN B-12)) injection 1,000 mcg      No facility-administered medications prior to visit.    ROS Review of Systems  Constitutional: Negative for appetite change, chills, fatigue and fever.  HENT: Negative.   Eyes: Negative for visual disturbance.  Respiratory:  Negative for cough, chest tightness, shortness of breath and wheezing.   Cardiovascular: Negative for chest pain, palpitations and leg swelling.  Gastrointestinal: Negative for abdominal pain, constipation, diarrhea, nausea and vomiting.  Endocrine: Negative for polydipsia, polyphagia and polyuria.  Genitourinary: Negative.  Negative for difficulty urinating.  Musculoskeletal: Negative for arthralgias and myalgias.  Skin: Negative.   Neurological: Negative.  Negative for dizziness, weakness, light-headedness and numbness.  Hematological: Negative for adenopathy. Does not bruise/bleed easily.  Psychiatric/Behavioral: Negative.     Objective:  BP 122/74   Pulse 64   Temp 98.2 F (36.8 C) (Oral)   Ht 5\' 7"  (1.702 m)   Wt 153 lb (69.4 kg)   SpO2 98%   BMI 23.96 kg/m   BP Readings from Last 3 Encounters:  12/16/20 122/74  08/15/20 140/82  09/11/19 (!) 181/80    Wt Readings from Last 3 Encounters:  12/16/20 153 lb (69.4 kg)  08/15/20 153 lb (69.4 kg)  08/17/18 162 lb (73.5 kg)    Physical Exam Vitals reviewed.  HENT:     Nose: Nose normal.     Mouth/Throat:     Mouth: Mucous membranes are moist.  Eyes:     General: No scleral icterus.    Conjunctiva/sclera: Conjunctivae normal.  Cardiovascular:     Rate and Rhythm: Normal rate and regular  rhythm.     Heart sounds: No murmur heard.   Pulmonary:     Effort: Pulmonary effort is normal.     Breath sounds: No stridor. No wheezing, rhonchi or rales.  Abdominal:     General: Abdomen is flat.     Palpations: There is no mass.     Tenderness: There is no abdominal tenderness. There is no guarding.  Musculoskeletal:        General: Normal range of motion.     Cervical back: Neck supple.     Right lower leg: No edema.     Left lower leg: No edema.  Lymphadenopathy:     Cervical: No cervical adenopathy.  Skin:    General: Skin is warm and dry.  Neurological:     General: No focal deficit present.     Mental Status: He  is alert.  Psychiatric:        Mood and Affect: Mood normal.        Behavior: Behavior normal.     Lab Results  Component Value Date   WBC 5.2 12/16/2020   HGB 12.4 (L) 12/16/2020   HCT 37.3 (L) 12/16/2020   PLT 165.0 12/16/2020   GLUCOSE 241 (H) 12/16/2020   CHOL  05/15/2009    125        ATP III CLASSIFICATION:  <200     mg/dL   Desirable  200-239  mg/dL   Borderline High  >=240    mg/dL   High          TRIG 188 (H) 05/15/2009   HDL 30 (L) 05/15/2009   LDLCALC  05/15/2009    57        Total Cholesterol/HDL:CHD Risk Coronary Heart Disease Risk Table                     Men   Women  1/2 Average Risk   3.4   3.3  Average Risk       5.0   4.4  2 X Average Risk   9.6   7.1  3 X Average Risk  23.4   11.0        Use the calculated Patient Ratio above and the CHD Risk Table to determine the patient's CHD Risk.        ATP III CLASSIFICATION (LDL):  <100     mg/dL   Optimal  100-129  mg/dL   Near or Above                    Optimal  130-159  mg/dL   Borderline  160-189  mg/dL   High  >190     mg/dL   Very High   ALT 12 05/14/2009   AST 21 05/14/2009   NA 137 12/16/2020   K 4.8 12/16/2020   CL 103 12/16/2020   CREATININE 1.05 12/16/2020   BUN 21 12/16/2020   CO2 28 12/16/2020   INR 1.1 05/14/2009   HGBA1C 9.3 (H) 12/16/2020    DG Ribs Bilateral W/Chest  Result Date: 11/11/2016 CLINICAL DATA:  [place] several days ago with anterior chest pain particularly right-sided, former smoking history EXAM: BILATERAL RIBS AND CHEST - 4+ VIEW COMPARISON:  Chest x-ray of 05/14/2009 FINDINGS: No active infiltrate or effusion is seen. No pneumothorax is noted. Mediastinal and hilar contours are unremarkable. The heart is minimally prominent. Bilateral rib detail film were obtained. There is very slight irregularity of the very anterior right seventh  rib which could represent a nondisplaced fracture. On the left there appears to be an old fracture involving the left seventh rib anteriorly  and laterally with thickening of the cortex. No acute left rib fracture is seen. IMPRESSION: 1. Possible nondisplaced fracture of the anterior right seventh rib. 2. Probable old fracture of the left anterolateral seventh rib. 3. No active lung disease. Electronically Signed   By: [name] M.D.   On: 11/11/2016 11:43    Assessment & Plan:   Aashrith was seen today for diabetes and anemia.  Diagnoses and all orders for this visit:  Primary hypertension- His blood pressure is adequately well controlled. -     Basic metabolic panel; Future -     Basic metabolic panel  Diabetes mellitus without complication ([place])- His P5T is up to 9.3%.  I recommended that he start using basal insulin with a GLP-1 agonist and to start taking an SGLT2 inhibitor. -     Basic metabolic panel; Future -     Hemoglobin A1c; Future -     Hemoglobin A1c -     Basic metabolic panel -     Insulin Glargine-Lixisenatide (SOLIQUA) 100-33 UNT-MCG/ML SOPN; Inject 20 Units into the skin daily. -     Discontinue: dapagliflozin propanediol (FARXIGA) 10 MG TABS tablet; Take 1 tablet (10 mg total) by mouth daily before breakfast. -     Continuous Blood Gluc Transmit (DEXCOM G6 TRANSMITTER) MISC; 1 Act by Does not apply route daily. -     Continuous Blood Gluc Sensor (DEXCOM G6 SENSOR) MISC; 1 Act by Does not apply route daily. -     Glucagon (GVOKE HYPOPEN 2-PACK) 1 MG/0.2ML SOAJ; Inject 1 Act into the skin daily as needed. -     Insulin Pen Needle 32G X 6 MM MISC; 1 Act by Does not apply route daily. -     dapagliflozin propanediol (FARXIGA) 10 MG TABS tablet; Take 1 tablet (10 mg total) by mouth daily before breakfast.  Vitamin B12 deficiency anemia due to intrinsic factor deficiency- He remains mildly anemic.  His folate level is normal.  We will continue B12 replacement therapy.  There is also likely an anemia of chronic disease. -     CBC with Differential/Platelet; Future -     Folate; Future -     Folate -     CBC with  Differential/Platelet  Stage 3b chronic kidney disease ([place])- I recommended that he start taking dapagliflozin to reduce the complications of chronic kidney disease in the setting of diabetes.   I have discontinued Jeneen Rinks C. Borland's lisinopril-hydrochlorothiazide, metFORMIN, and traMADol. I am also having him start on Soliqua, Dexcom G6 Transmitter, Dexcom G6 Sensor, Gvoke HypoPen 2-Pack, and Insulin Pen Needle. Additionally, I am having him maintain his omeprazole, aspirin EC, gabapentin, PRESCRIPTION MEDICATION, timolol, tamsulosin, pioglitazone, simvastatin, and dapagliflozin propanediol. We will stop administering cyanocobalamin and cyanocobalamin.  Meds ordered this encounter  Medications  . Insulin Glargine-Lixisenatide (SOLIQUA) 100-33 UNT-MCG/ML SOPN    Sig: Inject 20 Units into the skin daily.    Dispense:  9 mL    Refill:  1  . DISCONTD: dapagliflozin propanediol (FARXIGA) 10 MG TABS tablet    Sig: Take 1 tablet (10 mg total) by mouth daily before breakfast.    Dispense:  90 tablet    Refill:  1  . Continuous Blood Gluc Transmit (DEXCOM G6 TRANSMITTER) MISC    Sig: 1 Act by Does not apply route daily.    Dispense:  1 each    Refill:  5  . Continuous Blood Gluc Sensor (DEXCOM G6 SENSOR) MISC    Sig: 1 Act by Does not apply route daily.    Dispense:  1 each    Refill:  5  . Glucagon (GVOKE HYPOPEN 2-PACK) 1 MG/0.2ML SOAJ    Sig: Inject 1 Act into the skin daily as needed.    Dispense:  2 mL    Refill:  5  . Insulin Pen Needle 32G X 6 MM MISC    Sig: 1 Act by Does not apply route daily.    Dispense:  100 each    Refill:  1  . dapagliflozin propanediol (FARXIGA) 10 MG TABS tablet    Sig: Take 1 tablet (10 mg total) by mouth daily before breakfast.    Dispense:  30 tablet    Refill:  3     Follow-up: Return in about 6 months (around 06/15/2021).  Scarlette Calico, MD

## 2020-12-16 NOTE — Patient Instructions (Signed)
Type 2 Diabetes Mellitus, Diagnosis, Adult Type 2 diabetes (type 2 diabetes mellitus) is a long-term, or chronic, disease. In type 2 diabetes, one or both of these problems may be present:  The pancreas does not make enough of a hormone called insulin.  Cells in the body do not respond properly to insulin that the body makes (insulin resistance). Normally, insulin allows blood sugar (glucose) to enter cells in the body. The cells use glucose for energy. Insulin resistance or lack of insulin causes excess glucose to build up in the blood instead of going into cells. This causes high blood glucose (hyperglycemia).  What are the causes? The exact cause of type 2 diabetes is not known. What increases the risk? The following factors may make you more likely to develop this condition:  Having a family member with type 2 diabetes.  Being overweight or obese.  Being inactive (sedentary).  Having been diagnosed with insulin resistance.  Having a history of prediabetes, diabetes when you were pregnant (gestational diabetes), or polycystic ovary syndrome (PCOS). What are the signs or symptoms? In the early stage of this condition, you may not have symptoms. Symptoms develop slowly and may include:  Increased thirst or hunger.  Increased urination.  Unexplained weight loss.  Tiredness (fatigue) or weakness.  Vision changes, such as blurry vision.  Dark patches on the skin. How is this diagnosed? This condition is diagnosed based on your symptoms, your medical history, a physical exam, and your blood glucose level. Your blood glucose may be checked with one or more of the following blood tests:  A fasting blood glucose (FBG) test. You will not be allowed to eat (you will fast) for 8 hours or longer before a blood sample is taken.  A random blood glucose test. This test checks blood glucose at any time of day regardless of when you ate.  An A1C (hemoglobin A1C) blood test. This test  provides information about blood glucose levels over the previous 2-3 months.  An oral glucose tolerance test (OGTT). This test measures your blood glucose at two times: ? After fasting. This is your baseline blood glucose level. ? Two hours after drinking a beverage that contains glucose. You may be diagnosed with type 2 diabetes if:  Your fasting blood glucose level is 126 mg/dL (7.0 mmol/L) or higher.  Your random blood glucose level is 200 mg/dL (11.1 mmol/L) or higher.  Your A1C level is 6.5% or higher.  Your oral glucose tolerance test result is higher than 200 mg/dL (11.1 mmol/L). These blood tests may be repeated to confirm your diagnosis.   How is this treated? Your treatment may be managed by a specialist called an endocrinologist. Type 2 diabetes may be treated by following instructions from your health care provider about:  Making dietary and lifestyle changes. These may include: ? Following a personalized nutrition plan that is developed by a registered dietitian. ? Exercising regularly. ? Finding ways to manage stress.  Checking your blood glucose level as often as told.  Taking diabetes medicines or insulin daily. This helps to keep your blood glucose levels in the healthy range.  Taking medicines to help prevent complications from diabetes. Medicines may include: ? Aspirin. ? Medicine to lower cholesterol. ? Medicine to control blood pressure. Your health care provider will set treatment goals for you. Your goals will be based on your age, other medical conditions you have, and how you respond to diabetes treatment. Generally, the goal of treatment is to maintain the   following blood glucose levels:  Before meals: 80-130 mg/dL (4.4-7.2 mmol/L).  After meals: below 180 mg/dL (10 mmol/L).  A1C level: less than 7%. Follow these instructions at home: Questions to ask your health care provider Consider asking the following questions:  Should I meet with a certified  diabetes care and education specialist?  What diabetes medicines do I need, and when should I take them?  What equipment will I need to manage my diabetes at home?  How often do I need to check my blood glucose?  Where can I find a support group for people with diabetes?  What number can I call if I have questions?  When is my next appointment? General instructions  Take over-the-counter and prescription medicines only as told by your health care provider.  Keep all follow-up visits as told by your health care provider. This is important. Where to find more information  American Diabetes Association (ADA): www.diabetes.org  American Association of Diabetes Care and Education Specialists (ADCES): www.diabeteseducator.org  International Diabetes Federation (IDF): www.idf.org Contact a health care provider if:  Your blood glucose is at or above 240 mg/dL (13.3 mmol/L) for 2 days in a row.  You have been sick or have had a fever for 2 days or longer, and you are not getting better.  You have any of the following problems for more than 6 hours: ? You cannot eat or drink. ? You have nausea and vomiting. ? You have diarrhea. Get help right away if:  You have severe hypoglycemia. This means your blood glucose is lower than 54 mg/dL (3.0 mmol/L).  You become confused or you have trouble thinking clearly.  You have difficulty breathing.  You have moderate or large ketone levels in your urine. These symptoms may represent a serious problem that is an emergency. Do not wait to see if the symptoms will go away. Get medical help right away. Call your local emergency services (911 in the [place] Do not drive yourself to the hospital. Summary  Type 2 diabetes (type 2 diabetes mellitus) is a long-term, or chronic, disease. In type 2 diabetes, the pancreas does not make enough of a hormone called insulin, or cells in the body do not respond properly to insulin that the body makes (insulin  resistance).  This condition is treated by making dietary and lifestyle changes and taking diabetes medicines or insulin.  Your health care provider will set treatment goals for you. Your goals will be based on your age, other medical conditions you have, and how you respond to diabetes treatment.  Keep all follow-up visits as told by your health care provider. This is important. This information is not intended to replace advice given to you by your health care provider. Make sure you discuss any questions you have with your health care provider. Document Revised: 05/22/2020 Document Reviewed: 05/22/2020 Elsevier Patient Education  2021 Elsevier Inc.  

## 2020-12-17 MED ORDER — DAPAGLIFLOZIN PROPANEDIOL 10 MG PO TABS: 10.0000 mg | ORAL_TABLET | Freq: Every day | ORAL | 3 refills | Status: DC

## 2020-12-19 ENCOUNTER — Other Ambulatory Visit: Payer: Self-pay

## 2020-12-20 ENCOUNTER — Other Ambulatory Visit: Payer: Self-pay | Admitting: Internal Medicine

## 2020-12-20 ENCOUNTER — Other Ambulatory Visit: Payer: Self-pay

## 2020-12-20 ENCOUNTER — Ambulatory Visit (INDEPENDENT_AMBULATORY_CARE_PROVIDER_SITE_OTHER): Payer: Medicare Other

## 2020-12-20 ENCOUNTER — Encounter: Payer: Self-pay | Admitting: Internal Medicine

## 2020-12-20 DIAGNOSIS — D510 Vitamin B12 deficiency anemia due to intrinsic factor deficiency: Secondary | ICD-10-CM | POA: Diagnosis not present

## 2020-12-20 DIAGNOSIS — E119 Type 2 diabetes mellitus without complications: Secondary | ICD-10-CM

## 2020-12-20 MED ORDER — DEXCOM G6 SENSOR MISC: 1.0000 | Freq: Every day | 5 refills | Status: DC

## 2020-12-20 MED ORDER — DAPAGLIFLOZIN PROPANEDIOL 10 MG PO TABS: 10.0000 mg | ORAL_TABLET | Freq: Every day | ORAL | 3 refills | Status: DC

## 2020-12-20 MED ORDER — CYANOCOBALAMIN 1000 MCG/ML IJ SOLN
1000.0000 ug | Freq: Once | INTRAMUSCULAR | Status: AC
  Administered 2020-12-20: 1000 ug via INTRAMUSCULAR

## 2020-12-20 MED ORDER — GVOKE HYPOPEN 2-PACK 1 MG/0.2ML ~~LOC~~ SOAJ
1.0000 | Freq: Every day | SUBCUTANEOUS | 5 refills | Status: DC | PRN
Start: 2020-12-20 — End: 2021-03-20

## 2020-12-20 MED ORDER — DEXCOM G6 TRANSMITTER MISC: 1.0000 | Freq: Every day | 5 refills | Status: DC

## 2020-12-20 MED ORDER — SOLIQUA 100-33 UNT-MCG/ML ~~LOC~~ SOPN: 20.0000 [IU] | PEN_INJECTOR | Freq: Every day | SUBCUTANEOUS | 1 refills | Status: DC

## 2020-12-20 NOTE — Progress Notes (Addendum)
Patient seen in office today for B12 injection. Patient tolerated injection well.  I have reviewed and agree

## 2020-12-25 DIAGNOSIS — H401133 Primary open-angle glaucoma, bilateral, severe stage: Secondary | ICD-10-CM | POA: Diagnosis not present

## 2020-12-25 DIAGNOSIS — E113313 Type 2 diabetes mellitus with moderate nonproliferative diabetic retinopathy with macular edema, bilateral: Secondary | ICD-10-CM | POA: Diagnosis not present

## 2020-12-25 DIAGNOSIS — H04123 Dry eye syndrome of bilateral lacrimal glands: Secondary | ICD-10-CM | POA: Diagnosis not present

## 2020-12-25 DIAGNOSIS — Z961 Presence of intraocular lens: Secondary | ICD-10-CM | POA: Diagnosis not present

## 2021-01-13 ENCOUNTER — Encounter: Payer: Self-pay | Admitting: Internal Medicine

## 2021-01-17 ENCOUNTER — Ambulatory Visit: Payer: Medicare Other

## 2021-01-20 ENCOUNTER — Other Ambulatory Visit: Payer: Self-pay

## 2021-01-20 ENCOUNTER — Encounter (INDEPENDENT_AMBULATORY_CARE_PROVIDER_SITE_OTHER): Payer: Medicare Other | Admitting: Ophthalmology

## 2021-01-20 DIAGNOSIS — I10 Essential (primary) hypertension: Secondary | ICD-10-CM

## 2021-01-20 DIAGNOSIS — H43813 Vitreous degeneration, bilateral: Secondary | ICD-10-CM

## 2021-01-20 DIAGNOSIS — E113393 Type 2 diabetes mellitus with moderate nonproliferative diabetic retinopathy without macular edema, bilateral: Secondary | ICD-10-CM | POA: Diagnosis not present

## 2021-01-20 DIAGNOSIS — H35033 Hypertensive retinopathy, bilateral: Secondary | ICD-10-CM | POA: Diagnosis not present

## 2021-01-20 LAB — HM DIABETES EYE EXAM

## 2021-01-23 ENCOUNTER — Other Ambulatory Visit: Payer: Self-pay

## 2021-01-23 ENCOUNTER — Telehealth: Payer: Self-pay | Admitting: Internal Medicine

## 2021-01-23 NOTE — Telephone Encounter (Signed)
Patients daughter, Lattie Haw called and was wondering if the paper work for [facility] has been filled out and sent over for [place]. She said she called them yesterday and they are needing the providers portion filled out and the prescription sent over so it can be mailed to the patient. She said that the patient has been approved. Please advise  Phone: [phone]

## 2021-01-23 NOTE — Telephone Encounter (Signed)
We received confirmation that patient assistance application was approved for 01/23/21-01/22/22.

## 2021-01-24 ENCOUNTER — Ambulatory Visit (INDEPENDENT_AMBULATORY_CARE_PROVIDER_SITE_OTHER): Payer: Medicare Other | Admitting: *Deleted

## 2021-01-24 DIAGNOSIS — E538 Deficiency of other specified B group vitamins: Secondary | ICD-10-CM

## 2021-01-24 MED ORDER — CYANOCOBALAMIN 1000 MCG/ML IJ SOLN
1000.0000 ug | Freq: Once | INTRAMUSCULAR | Status: AC
  Administered 2021-01-24: 1000 ug via INTRAMUSCULAR

## 2021-01-24 NOTE — Progress Notes (Signed)
Pls cosign for B12 inj in absence of PCP../lmb   

## 2021-01-28 ENCOUNTER — Encounter: Payer: Self-pay | Admitting: Internal Medicine

## 2021-01-29 ENCOUNTER — Other Ambulatory Visit: Payer: Self-pay | Admitting: Internal Medicine

## 2021-01-29 DIAGNOSIS — E119 Type 2 diabetes mellitus without complications: Secondary | ICD-10-CM

## 2021-01-29 MED ORDER — DAPAGLIFLOZIN PROPANEDIOL 10 MG PO TABS: 10.0000 mg | ORAL_TABLET | Freq: Every day | ORAL | 1 refills | Status: DC

## 2021-02-10 ENCOUNTER — Encounter: Payer: Self-pay | Admitting: Internal Medicine

## 2021-02-11 ENCOUNTER — Other Ambulatory Visit: Payer: Self-pay | Admitting: Internal Medicine

## 2021-02-11 DIAGNOSIS — E119 Type 2 diabetes mellitus without complications: Secondary | ICD-10-CM

## 2021-02-11 MED ORDER — ONETOUCH VERIO FLEX SYSTEM W/DEVICE KIT: 1.0000 | PACK | Freq: Two times a day (BID) | 3 refills | Status: DC | PRN

## 2021-02-11 MED ORDER — ACCU-CHEK GUIDE ME W/DEVICE KIT: 1.0000 | PACK | Freq: Two times a day (BID) | 2 refills | Status: DC

## 2021-02-11 MED ORDER — ONETOUCH ULTRASOFT LANCETS MISC: 12 refills | Status: DC

## 2021-02-11 MED ORDER — ACCU-CHEK GUIDE VI STRP: 1.0000 | ORAL_STRIP | Freq: Two times a day (BID) | 1 refills | Status: DC

## 2021-02-11 MED ORDER — ONETOUCH ULTRA VI STRP: 1.0000 | ORAL_STRIP | Freq: Two times a day (BID) | 1 refills | Status: DC

## 2021-02-11 MED ORDER — ACCU-CHEK GUIDE CONTROL VI LIQD: 1.0000 | Freq: Two times a day (BID) | 3 refills | Status: DC

## 2021-02-12 DIAGNOSIS — E113313 Type 2 diabetes mellitus with moderate nonproliferative diabetic retinopathy with macular edema, bilateral: Secondary | ICD-10-CM | POA: Diagnosis not present

## 2021-02-12 DIAGNOSIS — Z961 Presence of intraocular lens: Secondary | ICD-10-CM | POA: Diagnosis not present

## 2021-02-12 DIAGNOSIS — H401133 Primary open-angle glaucoma, bilateral, severe stage: Secondary | ICD-10-CM | POA: Diagnosis not present

## 2021-02-12 DIAGNOSIS — H35033 Hypertensive retinopathy, bilateral: Secondary | ICD-10-CM | POA: Diagnosis not present

## 2021-02-13 ENCOUNTER — Other Ambulatory Visit: Payer: Self-pay | Admitting: Internal Medicine

## 2021-02-13 DIAGNOSIS — E119 Type 2 diabetes mellitus without complications: Secondary | ICD-10-CM

## 2021-02-13 MED ORDER — ONETOUCH VERIO VI STRP: ORAL_STRIP | 5 refills | Status: DC

## 2021-02-13 MED ORDER — ONETOUCH DELICA PLUS LANCET33G MISC
1.0000 | Freq: Two times a day (BID) | 5 refills | Status: DC
Start: 2021-02-13 — End: 2021-02-18

## 2021-02-13 MED ORDER — ONETOUCH VERIO W/DEVICE KIT: 1.0000 | PACK | Freq: Two times a day (BID) | 2 refills | Status: DC

## 2021-02-17 ENCOUNTER — Telehealth: Payer: Self-pay | Admitting: Internal Medicine

## 2021-02-17 DIAGNOSIS — E119 Type 2 diabetes mellitus without complications: Secondary | ICD-10-CM

## 2021-02-17 NOTE — Telephone Encounter (Signed)
[facility] called saying Medicare has approved for testing once a day, so they would need a new prescription sent along with the diagnosis code & signature   Verio- test strips Delica- lancets  Also need a new machine preferably the OneTouch Flex because they have it in stock

## 2021-02-18 MED ORDER — ONETOUCH VERIO VI STRP: ORAL_STRIP | 5 refills | Status: DC

## 2021-02-18 MED ORDER — ONETOUCH VERIO FLEX SYSTEM W/DEVICE KIT: PACK | 0 refills | Status: DC

## 2021-02-18 MED ORDER — ONETOUCH DELICA PLUS LANCET33G MISC: 1.0000 | Freq: Every day | 5 refills | Status: AC

## 2021-02-18 NOTE — Addendum Note (Signed)
Addended by: Hinda Kehr on: 02/18/2021 08:16 AM   Modules accepted: Orders

## 2021-02-26 ENCOUNTER — Ambulatory Visit (INDEPENDENT_AMBULATORY_CARE_PROVIDER_SITE_OTHER): Payer: Medicare Other

## 2021-02-26 ENCOUNTER — Other Ambulatory Visit: Payer: Self-pay

## 2021-02-26 DIAGNOSIS — E538 Deficiency of other specified B group vitamins: Secondary | ICD-10-CM

## 2021-02-26 MED ORDER — CYANOCOBALAMIN 1000 MCG/ML IJ SOLN
1000.0000 ug | INTRAMUSCULAR | Status: AC
  Administered 2021-02-26 – 2021-07-07 (×5): 1000 ug via INTRAMUSCULAR

## 2021-02-26 NOTE — Progress Notes (Signed)
Pt here for monthly B12 injection per Dr Ronnald Ramp.  B12 1051mcg given IM right deltoid and pt tolerated injection well.  Next B12 injection scheduled for 03/28/21.

## 2021-03-02 ENCOUNTER — Encounter: Payer: Self-pay | Admitting: Internal Medicine

## 2021-03-03 ENCOUNTER — Other Ambulatory Visit: Payer: Self-pay

## 2021-03-03 ENCOUNTER — Ambulatory Visit (INDEPENDENT_AMBULATORY_CARE_PROVIDER_SITE_OTHER): Payer: Medicare Other | Admitting: Internal Medicine

## 2021-03-03 ENCOUNTER — Ambulatory Visit (INDEPENDENT_AMBULATORY_CARE_PROVIDER_SITE_OTHER): Payer: Medicare Other

## 2021-03-03 ENCOUNTER — Encounter: Payer: Self-pay | Admitting: Internal Medicine

## 2021-03-03 VITALS — BP 138/72 | HR 73 | Temp 98.1°F | Ht 67.0 in | Wt 141.0 lb

## 2021-03-03 DIAGNOSIS — K219 Gastro-esophageal reflux disease without esophagitis: Secondary | ICD-10-CM | POA: Diagnosis not present

## 2021-03-03 DIAGNOSIS — I10 Essential (primary) hypertension: Secondary | ICD-10-CM | POA: Diagnosis not present

## 2021-03-03 DIAGNOSIS — E119 Type 2 diabetes mellitus without complications: Secondary | ICD-10-CM

## 2021-03-03 DIAGNOSIS — Z0389 Encounter for observation for other suspected diseases and conditions ruled out: Secondary | ICD-10-CM | POA: Diagnosis not present

## 2021-03-03 DIAGNOSIS — R634 Abnormal weight loss: Secondary | ICD-10-CM | POA: Diagnosis not present

## 2021-03-03 MED ORDER — METFORMIN HCL 500 MG PO TABS: 500.0000 mg | ORAL_TABLET | Freq: Two times a day (BID) | ORAL | 3 refills | Status: AC

## 2021-03-03 MED ORDER — LISINOPRIL 10 MG PO TABS: 10.0000 mg | ORAL_TABLET | Freq: Every day | ORAL | 3 refills | Status: DC

## 2021-03-03 NOTE — Patient Instructions (Addendum)
Ok to stop the lisinopril HCT as you have  Please take all new medication as prescribed - the lisinopril 10 mg per day  Please continue all other medications as before, including taking the metformin  Please have the pharmacy call with any other refills you may need.  Please continue your efforts at being more active, low cholesterol diet  Please keep your appointments with your specialists as you may have planned  Please go to the XRAY Department in the first floor for the x-ray testing  You will be contacted by phone if any changes need to be made immediately.  Otherwise, you will receive a letter about your results with an explanation, but please check with MyChart first.  Please remember to sign up for MyChart if you have not done so, as this will be important to you in the future with finding out test results, communicating by private email, and scheduling acute appointments online when needed.  Please see Dr Ronnald Ramp about may 7 or after to recheck the A1c and BP

## 2021-03-03 NOTE — Progress Notes (Deleted)
Patient ID: Andre Mills, male   DOB: 10-10-1925, 87 y.o.   MRN: [id number]

## 2021-03-03 NOTE — Progress Notes (Signed)
Patient ID: Andre Mills, male   DOB: [birth year], 87 y.o.   MRN: [id number]        Chief Complaint: fall with dizziness       HPI:  Andre Mills is a 87 y.o. male here with c/o dizziness and fall x 1 without injury, but associated with frank syncope, refused/refusing ED.  BP was noted low after the fall with SBP < 90, so stopped his lisinopril hct.  Did not check sugar but did not think had low sugar.  Has had recent wt loss for unclear reasons, just doesn't have the appetite.  Pt denies chest pain, increased sob or doe, wheezing, orthopnea, PND, increased LE swelling, palpitations.   Pt denies polydipsia, polyuria, or new focal neuro s/s.   Pt denies fever, wt loss, night sweats, loss of appetite, or other constitutional symptoms  Denies urinary symptoms such as dysuria, frequency, urgency, flank pain, hematuria or n/v, fever, chills.  Denies worsening reflux, abd pain, dysphagia, n/v, bowel change or blood.  Tolerating 3 OHA for sugar ok.      Wt Readings from Last 3 Encounters:  03/03/21 141 lb (64 kg)  12/16/20 153 lb (69.4 kg)  08/15/20 153 lb (69.4 kg)   BP Readings from Last 3 Encounters:  03/03/21 138/72  12/16/20 122/74  08/15/20 140/82         Past Medical History:  Diagnosis Date  . Anemia   . Arthritis   . Diabetes mellitus without complication ([place])   . GERD (gastroesophageal reflux disease)   . High cholesterol   . Hypertension   . Wears dentures    TOP  . Wears glasses    Past Surgical History:  Procedure Laterality Date  . COLONOSCOPY    . COLONOSCOPY W/ BIOPSIES AND POLYPECTOMY    . EYE SURGERY     both cataracts  . KNEE ARTHROSCOPY     left  . TONSILLECTOMY    . TRIGGER FINGER RELEASE Right 06/28/2014   Procedure: RELEASE A-1 PULLEY POSSIBLE EXCISION ONE SLIP SUPERFICIALIS  RIGHT MIDDLE FINGER ;  Surgeon: Daryll Brod, MD;  Location: [place];  Service: Orthopedics;  Laterality: Right;    reports that he quit smoking about 61 years ago. He  has never used smokeless tobacco. He reports that he does not drink alcohol and does not use drugs. family history is not on file. Allergies  Allergen Reactions  . Flexeril [Cyclobenzaprine]   . Limbitrol Ds [Chlordiazepoxide-Amitriptyline]   . Macrodantin [Nitrofurantoin Macrocrystal]   . Mobic [Meloxicam]   . Septra [Sulfamethoxazole-Trimethoprim]    Current Outpatient Medications on File Prior to Visit  Medication Sig Dispense Refill  . Blood Glucose Monitoring Suppl ([place]) w/Device KIT Use to check blood sugar once a day. E11.9 1 kit 0  . dapagliflozin propanediol (FARXIGA) 10 MG TABS tablet Take 1 tablet (10 mg total) by mouth daily before breakfast. 90 tablet 1  . gabapentin (NEURONTIN) 300 MG capsule Take 300 mg by mouth 2 (two) times daily.    Marland Kitchen glucose blood (ONETOUCH VERIO) test strip Use as instructed to check blood sugar once a day. E11.9 100 each 5  . Lancets (ONETOUCH DELICA PLUS EPPIRJ18A) MISC 1 Act by Does not apply route daily. E11.9 100 each 5  . omeprazole (PRILOSEC) 20 MG capsule Take 20 mg by mouth daily.    . pioglitazone (ACTOS) 30 MG tablet Take 30 mg by mouth daily.    Marland Kitchen PRESCRIPTION MEDICATION Place  1 drop into both eyes 2 (two) times daily. Eye drops for glaucoma    . simvastatin (ZOCOR) 40 MG tablet Take 40 mg by mouth daily.    . tamsulosin (FLOMAX) 0.4 MG CAPS capsule Take 0.4 mg by mouth daily.    . timolol (BLOCADREN) 5 MG tablet Take 5 mg by mouth 2 (two) times daily.    Marland Kitchen aspirin EC 81 MG tablet Take 81 mg by mouth daily. (Patient not taking: Reported on 03/03/2021)    . Glucagon (GVOKE HYPOPEN 2-PACK) 1 MG/0.2ML SOAJ Inject 1 Act into the skin daily as needed. (Patient not taking: Reported on 03/03/2021) 2 mL 5  . Insulin Pen Needle 32G X 6 MM MISC 1 Act by Does not apply route daily. (Patient not taking: Reported on 03/03/2021) 100 each 1   Current Facility-Administered Medications on File Prior to Visit  Medication Dose Route  Frequency Provider Last Rate Last Admin  . cyanocobalamin ((VITAMIN B-12)) injection 1,000 mcg  1,000 mcg Intramuscular Q30 days Janith Lima, MD   1,000 mcg at 02/26/21 0959        ROS:  All others reviewed and negative.  Objective        PE:  BP 138/72 (BP Location: Left Arm, Patient Position: Sitting, Cuff Size: Normal)   Pulse 73   Temp 98.1 F (36.7 C) (Oral)   Ht 5' 7"  (1.702 m)   Wt 141 lb (64 kg)   SpO2 100%   BMI 22.08 kg/m                 Constitutional: Pt appears in NAD               HENT: Head: NCAT.                Right Ear: External ear normal.                 Left Ear: External ear normal.                Eyes: . Pupils are equal, round, and reactive to light. Conjunctivae and EOM are normal               Nose: without d/c or deformity               Neck: Neck supple. Gross normal ROM               Cardiovascular: Normal rate and regular rhythm.                 Pulmonary/Chest: Effort normal and breath sounds without rales or wheezing.                Abd:  Soft, NT, ND, + BS, no organomegaly               Neurological: Pt is alert. At baseline orientation, motor grossly intact               Skin: Skin is warm. No rashes, no other new lesions, LE edema - none               Psychiatric: Pt behavior is normal without agitation   Micro: none  Cardiac tracings I have personally interpreted today:  none  Pertinent Radiological findings (summarize): none   Lab Results  Component Value Date   WBC 5.2 12/16/2020   HGB 12.4 (L) 12/16/2020   HCT 37.3 (L) 12/16/2020   PLT 165.0 12/16/2020  GLUCOSE 241 (H) 12/16/2020   CHOL  05/15/2009    125        ATP III CLASSIFICATION:  <200     mg/dL   Desirable  200-239  mg/dL   Borderline High  >=240    mg/dL   High          TRIG 188 (H) 05/15/2009   HDL 30 (L) 05/15/2009   LDLCALC  05/15/2009    57        Total Cholesterol/HDL:CHD Risk Coronary Heart Disease Risk Table                     Men   Women  1/2  Average Risk   3.4   3.3  Average Risk       5.0   4.4  2 X Average Risk   9.6   7.1  3 X Average Risk  23.4   11.0        Use the calculated Patient Ratio above and the CHD Risk Table to determine the patient's CHD Risk.        ATP III CLASSIFICATION (LDL):  <100     mg/dL   Optimal  100-129  mg/dL   Near or Above                    Optimal  130-159  mg/dL   Borderline  160-189  mg/dL   High  >190     mg/dL   Very High   ALT 12 05/14/2009   AST 21 05/14/2009   NA 137 12/16/2020   K 4.8 12/16/2020   CL 103 12/16/2020   CREATININE 1.05 12/16/2020   BUN 21 12/16/2020   CO2 28 12/16/2020   INR 1.1 05/14/2009   HGBA1C 9.3 (H) 12/16/2020   Assessment/Plan:  BREWSTER WOLTERS is a 87 y.o. White or Caucasian [1] male with  has a past medical history of Anemia, Arthritis, Diabetes mellitus without complication ([place]), GERD (gastroesophageal reflux disease), High cholesterol, Hypertension, Wears dentures, and Wears glasses.  Weight loss Etiology unclear, for cxr, but likely to be reason for BP overcontrolled  Hypertension Overcontrolled, but will need at least low dose lisinopril 10 mg started back,  to f/u any worsening symptoms or concerns  GERD (gastroesophageal reflux disease) Stable, to continue prilosec   Diabetes mellitus without complication (H[facility]) Overall stable by hx, to continue current med tx - metfromin, farxiga, actos as seems to be tolerating well with the wt loss Lab Results  Component Value Date   HGBA1C 9.3 (H) 12/16/2020      Followup: Return in about 2 weeks (around 03/17/2021).  Cathlean Cower, MD 03/11/2021 9:13 PM [facility]

## 2021-03-10 DIAGNOSIS — H401133 Primary open-angle glaucoma, bilateral, severe stage: Secondary | ICD-10-CM | POA: Diagnosis not present

## 2021-03-10 DIAGNOSIS — H00025 Hordeolum internum left lower eyelid: Secondary | ICD-10-CM | POA: Diagnosis not present

## 2021-03-11 ENCOUNTER — Encounter: Payer: Self-pay | Admitting: Internal Medicine

## 2021-03-11 NOTE — Assessment & Plan Note (Signed)
Stable, to continue prilosec

## 2021-03-11 NOTE — Assessment & Plan Note (Signed)
Overall stable by hx, to continue current med tx - metfromin, farxiga, actos as seems to be tolerating well with the wt loss Lab Results  Component Value Date   HGBA1C 9.3 (H) 12/16/2020

## 2021-03-11 NOTE — Assessment & Plan Note (Signed)
Overcontrolled, but will need at least low dose lisinopril 10 mg started back,  to f/u any worsening symptoms or concerns

## 2021-03-11 NOTE — Assessment & Plan Note (Signed)
Etiology unclear, for cxr, but likely to be reason for BP overcontrolled

## 2021-03-20 ENCOUNTER — Ambulatory Visit (INDEPENDENT_AMBULATORY_CARE_PROVIDER_SITE_OTHER): Payer: Medicare Other | Admitting: Internal Medicine

## 2021-03-20 ENCOUNTER — Encounter: Payer: Self-pay | Admitting: Internal Medicine

## 2021-03-20 ENCOUNTER — Other Ambulatory Visit: Payer: Self-pay

## 2021-03-20 VITALS — BP 132/72 | HR 61 | Temp 98.3°F | Resp 16 | Ht 67.0 in | Wt 144.0 lb

## 2021-03-20 DIAGNOSIS — D510 Vitamin B12 deficiency anemia due to intrinsic factor deficiency: Secondary | ICD-10-CM | POA: Diagnosis not present

## 2021-03-20 DIAGNOSIS — E538 Deficiency of other specified B group vitamins: Secondary | ICD-10-CM

## 2021-03-20 DIAGNOSIS — N1832 Chronic kidney disease, stage 3b: Secondary | ICD-10-CM | POA: Diagnosis not present

## 2021-03-20 DIAGNOSIS — E119 Type 2 diabetes mellitus without complications: Secondary | ICD-10-CM | POA: Diagnosis not present

## 2021-03-20 DIAGNOSIS — D539 Nutritional anemia, unspecified: Secondary | ICD-10-CM | POA: Insufficient documentation

## 2021-03-20 DIAGNOSIS — I10 Essential (primary) hypertension: Secondary | ICD-10-CM

## 2021-03-20 LAB — URINALYSIS, ROUTINE W REFLEX MICROSCOPIC
Bilirubin Urine: NEGATIVE
Hgb urine dipstick: NEGATIVE
Ketones, ur: NEGATIVE
Leukocytes,Ua: NEGATIVE
Nitrite: NEGATIVE
Specific Gravity, Urine: 1.01 (ref 1.000–1.030)
Total Protein, Urine: NEGATIVE
Urine Glucose: >=1000 — AB
Urobilinogen, UA: 0.2 (ref 0.0–1.0)
pH: 6 (ref 5.0–8.0)

## 2021-03-20 LAB — HEPATIC FUNCTION PANEL
ALT: 9 U/L (ref 0–53)
AST: 12 U/L (ref 0–37)
Albumin: 4 g/dL (ref 3.5–5.2)
Alkaline Phosphatase: 57 U/L (ref 39–117)
Bilirubin, Direct: 0.1 mg/dL (ref 0.0–0.3)
Total Bilirubin: 0.3 mg/dL (ref 0.2–1.2)
Total Protein: 6.7 g/dL (ref 6.0–8.3)

## 2021-03-20 LAB — CBC WITH DIFFERENTIAL/PLATELET
Basophils Absolute: 0.1 10*3/uL (ref 0.0–0.1)
Basophils Relative: 1.6 % (ref 0.0–3.0)
Eosinophils Absolute: 0.5 10*3/uL (ref 0.0–0.7)
Eosinophils Relative: 8.9 % — ABNORMAL HIGH (ref 0.0–5.0)
HCT: 37.3 % — ABNORMAL LOW (ref 39.0–52.0)
Hemoglobin: 12.3 g/dL — ABNORMAL LOW (ref 13.0–17.0)
Lymphocytes Relative: 29.5 % (ref 12.0–46.0)
Lymphs Abs: 1.7 10*3/uL (ref 0.7–4.0)
MCHC: 33.1 g/dL (ref 30.0–36.0)
MCV: 92 fl (ref 78.0–100.0)
Monocytes Absolute: 0.4 10*3/uL (ref 0.1–1.0)
Monocytes Relative: 7.3 % (ref 3.0–12.0)
Neutro Abs: 3 10*3/uL (ref 1.4–7.7)
Neutrophils Relative %: 52.7 % (ref 43.0–77.0)
Platelets: 177 10*3/uL (ref 150.0–400.0)
RBC: 4.05 Mil/uL — ABNORMAL LOW (ref 4.22–5.81)
RDW: 14.4 % (ref 11.5–15.5)
WBC: 5.7 10*3/uL (ref 4.0–10.5)

## 2021-03-20 LAB — BASIC METABOLIC PANEL
BUN: 28 mg/dL — ABNORMAL HIGH (ref 6–23)
CO2: 29 mEq/L (ref 19–32)
Calcium: 9 mg/dL (ref 8.4–10.5)
Chloride: 106 mEq/L (ref 96–112)
Creatinine, Ser: 1.21 mg/dL (ref 0.40–1.50)
GFR: 51.44 mL/min — ABNORMAL LOW (ref >60.00)
Glucose, Bld: 195 mg/dL — ABNORMAL HIGH (ref 70–99)
Potassium: 4.7 mEq/L (ref 3.5–5.1)
Sodium: 139 mEq/L (ref 135–145)

## 2021-03-20 LAB — TSH: TSH: 3.61 u[IU]/mL (ref 0.35–4.50)

## 2021-03-20 LAB — FOLATE: Folate: 17.7 ng/mL (ref >5.9)

## 2021-03-20 LAB — FERRITIN: Ferritin: 15.6 ng/mL — ABNORMAL LOW (ref 22.0–322.0)

## 2021-03-20 LAB — HEMOGLOBIN A1C: Hgb A1c MFr Bld: 8.2 % — ABNORMAL HIGH (ref 4.6–6.5)

## 2021-03-20 LAB — IRON: Iron: 61 ug/dL (ref 42–165)

## 2021-03-20 NOTE — Progress Notes (Signed)
Subjective:  Patient ID: Andre Mills, male    DOB: 14-Nov-1924  Age: 87 y.o. MRN: [id number]  CC: Anemia  This visit occurred during the SARS-CoV-2 public health emergency.  Safety protocols were in place, including screening questions prior to the visit, additional usage of staff PPE, and extensive cleaning of exam room while observing appropriate contact time as indicated for disinfecting solutions.    HPI Andre Mills presents for f/up -  He returns with his D today. He continues to feel weak and has had a few falls without injury. He thinks he has seen some blood over the tip of his penis.  Outpatient Medications Prior to Visit  Medication Sig Dispense Refill  . Blood Glucose Monitoring Suppl ([place]) w/Device KIT Use to check blood sugar once a day. E11.9 1 kit 0  . dapagliflozin propanediol (FARXIGA) 10 MG TABS tablet Take 1 tablet (10 mg total) by mouth daily before breakfast. 90 tablet 1  . gabapentin (NEURONTIN) 300 MG capsule Take 300 mg by mouth 2 (two) times daily.    Marland Kitchen glucose blood (ONETOUCH VERIO) test strip Use as instructed to check blood sugar once a day. E11.9 100 each 5  . Lancets (ONETOUCH DELICA PLUS [id number]) MISC 1 Act by Does not apply route daily. E11.9 100 each 5  . metFORMIN (GLUCOPHAGE) 500 MG tablet Take 1 tablet (500 mg total) by mouth 2 (two) times daily with a meal. 180 tablet 3  . omeprazole (PRILOSEC) 20 MG capsule Take 20 mg by mouth daily.    . pioglitazone (ACTOS) 30 MG tablet Take 30 mg by mouth daily.    Marland Kitchen PRESCRIPTION MEDICATION Place 1 drop into both eyes 2 (two) times daily. Eye drops for glaucoma    . simvastatin (ZOCOR) 40 MG tablet Take 40 mg by mouth daily.    . tamsulosin (FLOMAX) 0.4 MG CAPS capsule Take 0.4 mg by mouth daily.    . timolol (BLOCADREN) 5 MG tablet Take 5 mg by mouth 2 (two) times daily.    Marland Kitchen aspirin EC 81 MG tablet Take 81 mg by mouth daily.    . Glucagon (GVOKE HYPOPEN 2-PACK) 1 MG/0.2ML SOAJ  Inject 1 Act into the skin daily as needed. 2 mL 5  . Insulin Pen Needle 32G X 6 MM MISC 1 Act by Does not apply route daily. 100 each 1  . lisinopril (ZESTRIL) 10 MG tablet Take 1 tablet (10 mg total) by mouth daily. 90 tablet 3   Facility-Administered Medications Prior to Visit  Medication Dose Route Frequency Provider Last Rate Last Admin  . cyanocobalamin ((VITAMIN B-12)) injection 1,000 mcg  1,000 mcg Intramuscular Q30 days Janith Lima, MD   1,000 mcg at 02/26/21 0959    ROS Review of Systems  Constitutional: Negative.  Negative for appetite change, diaphoresis, fatigue and unexpected weight change.  HENT: Negative.   Respiratory: Negative for cough, chest tightness, shortness of breath and wheezing.   Cardiovascular: Negative for chest pain, palpitations and leg swelling.  Gastrointestinal: Negative for blood in stool, constipation and nausea.  Genitourinary: Positive for difficulty urinating. Negative for dysuria and hematuria.  Musculoskeletal: Negative.  Negative for back pain.  Skin: Negative.  Negative for color change.  Neurological: Positive for dizziness, weakness and light-headedness. Negative for tremors, seizures, numbness and headaches.  Hematological: Negative for adenopathy. Does not bruise/bleed easily.  Psychiatric/Behavioral: Negative.     Objective:  BP 132/72 (BP Location: Left Arm, Patient Position: Sitting, Cuff  Size: Normal)   Pulse 61   Temp 98.3 F (36.8 C) (Oral)   Resp 16   Ht 5' 7"  (1.702 m)   Wt 144 lb (65.3 kg)   SpO2 96%   BMI 22.55 kg/m   BP Readings from Last 3 Encounters:  03/20/21 132/72  03/03/21 138/72  12/16/20 122/74    Wt Readings from Last 3 Encounters:  03/20/21 144 lb (65.3 kg)  03/03/21 141 lb (64 kg)  12/16/20 153 lb (69.4 kg)    Physical Exam Vitals reviewed.  Constitutional:      Appearance: He is not ill-appearing.  HENT:     Nose: Nose normal.     Mouth/Throat:     Mouth: Mucous membranes are moist.  Mucous membranes are pale.  Eyes:     General: No scleral icterus.    Conjunctiva/sclera: Conjunctivae normal.  Cardiovascular:     Rate and Rhythm: Normal rate and regular rhythm.     Heart sounds: No murmur heard.   Pulmonary:     Effort: Pulmonary effort is normal.     Breath sounds: No stridor. No wheezing, rhonchi or rales.  Abdominal:     General: Abdomen is flat. Bowel sounds are normal. There is no distension.     Palpations: Abdomen is soft. There is no hepatomegaly, splenomegaly or mass.     Tenderness: There is no abdominal tenderness.     Hernia: No hernia is present. There is no hernia in the left inguinal area or right inguinal area.  Genitourinary:    Pubic Area: No rash.      Penis: Normal and circumcised. No erythema or lesions.      Testes: Normal.        Right: Mass not present.        Left: Mass not present.     Epididymis:     Right: Normal.     Left: Normal.     Prostate: Enlarged. Not tender and no nodules present.     Rectum: Normal. Guaiac result negative. No mass, tenderness, anal fissure, external hemorrhoid or internal hemorrhoid. Normal anal tone.  Musculoskeletal:        General: Normal range of motion.     Cervical back: Neck supple.     Right lower leg: No edema.  Lymphadenopathy:     Cervical: No cervical adenopathy.     Lower Body: No right inguinal adenopathy. No left inguinal adenopathy.  Skin:    General: Skin is warm and dry.  Neurological:     Mental Status: He is alert. Mental status is at baseline.     Lab Results  Component Value Date   WBC 5.7 03/20/2021   HGB 12.3 (L) 03/20/2021   HCT 37.3 (L) 03/20/2021   PLT 177.0 03/20/2021   GLUCOSE 195 (H) 03/20/2021   CHOL  05/15/2009    125        ATP III CLASSIFICATION:  <200     mg/dL   Desirable  200-239  mg/dL   Borderline High  >=240    mg/dL   High          TRIG 188 (H) 05/15/2009   HDL 30 (L) 05/15/2009   LDLCALC  05/15/2009    57        Total Cholesterol/HDL:CHD  Risk Coronary Heart Disease Risk Table                     Men   Women  1/2 Average  Risk   3.4   3.3  Average Risk       5.0   4.4  2 X Average Risk   9.6   7.1  3 X Average Risk  23.4   11.0        Use the calculated Patient Ratio above and the CHD Risk Table to determine the patient's CHD Risk.        ATP III CLASSIFICATION (LDL):  <100     mg/dL   Optimal  100-129  mg/dL   Near or Above                    Optimal  130-159  mg/dL   Borderline  160-189  mg/dL   High  >190     mg/dL   Very High   ALT 9 03/20/2021   AST 12 03/20/2021   NA 139 03/20/2021   K 4.7 03/20/2021   CL 106 03/20/2021   CREATININE 1.21 03/20/2021   BUN 28 (H) 03/20/2021   CO2 29 03/20/2021   TSH 3.61 03/20/2021   INR 1.1 05/14/2009   HGBA1C 8.2 (H) 03/20/2021    DG Ribs Bilateral W/Chest  Result Date: 11/11/2016 CLINICAL DATA:  [place] several days ago with anterior chest pain particularly right-sided, former smoking history EXAM: BILATERAL RIBS AND CHEST - 4+ VIEW COMPARISON:  Chest x-ray of 05/14/2009 FINDINGS: No active infiltrate or effusion is seen. No pneumothorax is noted. Mediastinal and hilar contours are unremarkable. The heart is minimally prominent. Bilateral rib detail film were obtained. There is very slight irregularity of the very anterior right seventh rib which could represent a nondisplaced fracture. On the left there appears to be an old fracture involving the left seventh rib anteriorly and laterally with thickening of the cortex. No acute left rib fracture is seen. IMPRESSION: 1. Possible nondisplaced fracture of the anterior right seventh rib. 2. Probable old fracture of the left anterolateral seventh rib. 3. No active lung disease. Electronically Signed   By: [name] M.D.   On: 11/11/2016 11:43    Assessment & Plan:   Elzie was seen today for anemia.  Diagnoses and all orders for this visit:  Deficiency anemia- I will monitor his vitamin levels. -     CBC with  Differential/Platelet; Future -     Iron; Future -     Vitamin B1; Future -     Zinc; Future -     Ferritin; Future -     Folate; Future -     Reticulocytes; Future -     Reticulocytes -     Folate -     Ferritin -     Zinc -     Vitamin B1 -     Iron -     CBC with Differential/Platelet  Primary hypertension- He is not tolerating the ACEI. Will discontinue. -     Hepatic function panel; Future -     Basic metabolic panel; Future -     TSH; Future -     Urinalysis, Routine w reflex microscopic; Future -     Urinalysis, Routine w reflex microscopic -     TSH -     Basic metabolic panel -     Hepatic function panel  Vitamin B12 deficiency -     CBC with Differential/Platelet; Future -     CBC with Differential/Platelet  Stage 3b chronic kidney disease ([place])- his renal function is stable. 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 -     Folate; Future -     Folate  Diabetes mellitus without complication ([place])- His blood sugar is adequately well controlled. -     Hemoglobin A1c; Future -     Hemoglobin A1c   I have discontinued Jeneen Rinks C. Forrest's aspirin EC, Insulin Pen Needle, [place] HypoPen 2-Pack, and lisinopril. I am also having him maintain his omeprazole, gabapentin, PRESCRIPTION MEDICATION, timolol, tamsulosin, pioglitazone, simvastatin, dapagliflozin propanediol, OneTouch Delica Plus [id number], OneTouch Verio, [employer], and metFORMIN. We will continue to administer cyanocobalamin.  No orders of the defined types were placed in this encounter.    Follow-up: Return in about 3 months (around 06/20/2021).  Scarlette Calico, MD

## 2021-03-20 NOTE — Patient Instructions (Signed)
[employer] medicine (25th ed., pp. [phone]). [place], [place]: Elsevier.">  Anemia  Anemia is a condition in which there is not enough red blood cells or hemoglobin in the blood. Hemoglobin is a substance in red blood cells that carries oxygen. When you do not have enough red blood cells or hemoglobin (are anemic), your body cannot get enough oxygen and your organs may not work properly. As a result, you may feel very tired or have other problems. What are the causes? Common causes of anemia include:  Excessive bleeding. Anemia can be caused by excessive bleeding inside or outside the body, including bleeding from the intestines or from heavy menstrual periods in females.  Poor nutrition.  Long-lasting (chronic) kidney, thyroid, and liver disease.  Bone marrow disorders, spleen problems, and blood disorders.  Cancer and treatments for cancer.  HIV (human immunodeficiency virus) and AIDS (acquired immunodeficiency syndrome).  Infections, medicines, and autoimmune disorders that destroy red blood cells. What are the signs or symptoms? Symptoms of this condition include:  Minor weakness.  Dizziness.  Headache, or difficulties concentrating and sleeping.  Heartbeats that feel irregular or faster than normal (palpitations).  Shortness of breath, especially with exercise.  Pale skin, lips, and nails, or cold hands and feet.  Indigestion and nausea. Symptoms may occur suddenly or develop slowly. If your anemia is mild, you may not have symptoms. How is this diagnosed? This condition is diagnosed based on blood tests, your medical history, and a physical exam. In some cases, a test may be needed in which cells are removed from the soft tissue inside of a bone and looked at under a microscope (bone marrow biopsy). Your health care provider may also check your stool (feces) for blood and may do additional testing to look for the cause of your bleeding. Other tests may  include:  Imaging tests, such as a CT scan or MRI.  A procedure to see inside your esophagus and stomach (endoscopy).  A procedure to see inside your colon and rectum (colonoscopy). How is this treated? Treatment for this condition depends on the cause. If you continue to lose a lot of blood, you may need to be treated at a hospital. Treatment may include:  Taking supplements of iron, vitamin Q68, or folic acid.  Taking a hormone medicine (erythropoietin) that can help to stimulate red blood cell growth.  Having a blood transfusion. This may be needed if you lose a lot of blood.  Making changes to your diet.  Having surgery to remove your spleen. Follow these instructions at home:  Take over-the-counter and prescription medicines only as told by your health care provider.  Take supplements only as told by your health care provider.  Follow any diet instructions that you were given by your health care provider.  Keep all follow-up visits as told by your health care provider. This is important. Contact a health care provider if:  You develop new bleeding anywhere in the body. Get help right away if:  You are very weak.  You are short of breath.  You have pain in your abdomen or chest.  You are dizzy or feel faint.  You have trouble concentrating.  You have bloody stools, black stools, or tarry stools.  You vomit repeatedly or you vomit up blood. These symptoms may represent a serious problem that is an emergency. Do not wait to see if the symptoms will go away. Get medical help right away. Call your local emergency services (911 in the [place] Do not  drive yourself to the hospital. Summary  Anemia is a condition in which you do not have enough red blood cells or enough of a substance in your red blood cells that carries oxygen (hemoglobin).  Symptoms may occur suddenly or develop slowly.  If your anemia is mild, you may not have symptoms.  This condition is  diagnosed with blood tests, a medical history, and a physical exam. Other tests may be needed.  Treatment for this condition depends on the cause of the anemia. This information is not intended to replace advice given to you by your health care provider. Make sure you discuss any questions you have with your health care provider. Document Revised: 10/03/2019 Document Reviewed: 10/03/2019 Elsevier Patient Education  2021 [employer] Inc.  

## 2021-03-26 ENCOUNTER — Encounter: Payer: Self-pay | Admitting: Internal Medicine

## 2021-03-28 ENCOUNTER — Ambulatory Visit (INDEPENDENT_AMBULATORY_CARE_PROVIDER_SITE_OTHER): Payer: Medicare Other

## 2021-03-28 ENCOUNTER — Other Ambulatory Visit: Payer: Self-pay

## 2021-03-28 DIAGNOSIS — E538 Deficiency of other specified B group vitamins: Secondary | ICD-10-CM | POA: Diagnosis not present

## 2021-03-28 LAB — ZINC: Zinc: 71 ug/dL (ref 60–130)

## 2021-03-28 LAB — VITAMIN B1: Vitamin B1 (Thiamine): 9 nmol/L (ref 8–30)

## 2021-03-28 LAB — RETICULOCYTES
ABS Retic: 37440 cells/uL (ref 25000–90000)
Retic Ct Pct: 0.9 %

## 2021-03-28 NOTE — Progress Notes (Signed)
B12 given

## 2021-04-08 DIAGNOSIS — Z23 Encounter for immunization: Secondary | ICD-10-CM | POA: Diagnosis not present

## 2021-05-02 ENCOUNTER — Ambulatory Visit (INDEPENDENT_AMBULATORY_CARE_PROVIDER_SITE_OTHER): Payer: Medicare Other

## 2021-05-02 ENCOUNTER — Other Ambulatory Visit: Payer: Self-pay

## 2021-05-02 DIAGNOSIS — E538 Deficiency of other specified B group vitamins: Secondary | ICD-10-CM

## 2021-05-02 NOTE — Progress Notes (Signed)
Patient came into the office to receive his vitamin b12 injection. He tolerated the injection well.

## 2021-05-15 ENCOUNTER — Encounter: Payer: Self-pay | Admitting: Internal Medicine

## 2021-05-15 ENCOUNTER — Ambulatory Visit (INDEPENDENT_AMBULATORY_CARE_PROVIDER_SITE_OTHER): Payer: Medicare Other | Admitting: Internal Medicine

## 2021-05-15 ENCOUNTER — Other Ambulatory Visit: Payer: Self-pay

## 2021-05-15 VITALS — BP 144/82 | HR 65 | Temp 98.4°F | Ht 67.0 in | Wt 139.0 lb

## 2021-05-15 DIAGNOSIS — E119 Type 2 diabetes mellitus without complications: Secondary | ICD-10-CM | POA: Diagnosis not present

## 2021-05-15 DIAGNOSIS — I10 Essential (primary) hypertension: Secondary | ICD-10-CM | POA: Diagnosis not present

## 2021-05-15 DIAGNOSIS — R64 Cachexia: Secondary | ICD-10-CM | POA: Diagnosis not present

## 2021-05-15 MED ORDER — DRONABINOL 2.5 MG PO CAPS: 2.5000 mg | ORAL_CAPSULE | Freq: Two times a day (BID) | ORAL | 1 refills | Status: DC

## 2021-05-15 NOTE — Progress Notes (Signed)
Ia   Subjective:  Patient ID: Andre Mills, male    DOB: [birth year]  Age: 87 y.o. MRN: [id number]  CC: Anemia and Diabetes  This visit occurred during the SARS-CoV-2 public health emergency.  Safety protocols were in place, including screening questions prior to the visit, additional usage of staff PPE, and extensive cleaning of exam room while observing appropriate contact time as indicated for disinfecting solutions.    HPI Andre Mills presents for f/up -  He complains of poor appetite and weight loss.  Outpatient Medications Prior to Visit  Medication Sig Dispense Refill   Blood Glucose Monitoring Suppl ([place]) w/Device KIT Use to check blood sugar once a day. E11.9 1 kit 0   dapagliflozin propanediol (FARXIGA) 10 MG TABS tablet Take 1 tablet (10 mg total) by mouth daily before breakfast. 90 tablet 1   gabapentin (NEURONTIN) 300 MG capsule Take 300 mg by mouth 2 (two) times daily.     glucose blood (ONETOUCH VERIO) test strip Use as instructed to check blood sugar once a day. E11.9 100 each 5   Lancets (ONETOUCH DELICA PLUS [id number]) MISC 1 Act by Does not apply route daily. E11.9 100 each 5   metFORMIN (GLUCOPHAGE) 500 MG tablet Take 1 tablet (500 mg total) by mouth 2 (two) times daily with a meal. 180 tablet 3   omeprazole (PRILOSEC) 20 MG capsule Take 20 mg by mouth daily.     pioglitazone (ACTOS) 30 MG tablet Take 30 mg by mouth daily.     PRESCRIPTION MEDICATION Place 1 drop into both eyes 2 (two) times daily. Eye drops for glaucoma     simvastatin (ZOCOR) 40 MG tablet Take 40 mg by mouth daily.     tamsulosin (FLOMAX) 0.4 MG CAPS capsule Take 0.4 mg by mouth daily.     timolol (BLOCADREN) 5 MG tablet Take 5 mg by mouth 2 (two) times daily.     Facility-Administered Medications Prior to Visit  Medication Dose Route Frequency Provider Last Rate Last Admin   cyanocobalamin ((VITAMIN B-12)) injection 1,000 mcg  1,000 mcg Intramuscular Q30 days Janith Lima, MD   1,000 mcg at 05/02/21 1019    ROS Review of Systems  Constitutional:  Positive for appetite change and unexpected weight change. Negative for diaphoresis, fatigue and fever.  HENT: Negative.    Eyes: Negative.   Respiratory:  Negative for cough and shortness of breath.   Cardiovascular:  Negative for chest pain, palpitations and leg swelling.  Gastrointestinal:  Negative for abdominal pain, diarrhea and nausea.  Endocrine: Negative.  Negative for polyuria.  Genitourinary:  Negative for difficulty urinating.  Musculoskeletal:  Negative for arthralgias and myalgias.  Skin: Negative.   Neurological: Negative.  Negative for dizziness, weakness, light-headedness and numbness.  Hematological: Negative.   Psychiatric/Behavioral:  Positive for confusion and decreased concentration.    Objective:  BP (!) 144/82 (BP Location: Right Arm, Patient Position: Sitting, Cuff Size: Large)   Pulse 65   Temp 98.4 F (36.9 C) (Oral)   Ht 5' 7"  (1.702 m)   Wt 139 lb (63 kg)   SpO2 95%   BMI 21.77 kg/m   BP Readings from Last 3 Encounters:  05/15/21 (!) 144/82  03/20/21 132/72  03/03/21 138/72    Wt Readings from Last 3 Encounters:  05/15/21 139 lb (63 kg)  03/20/21 144 lb (65.3 kg)  03/03/21 141 lb (64 kg)    Physical Exam HENT:     Nose:  Nose normal.     Mouth/Throat:     Mouth: Mucous membranes are moist.  Eyes:     General: No scleral icterus.    Conjunctiva/sclera: Conjunctivae normal.  Cardiovascular:     Rate and Rhythm: Normal rate and regular rhythm.     Pulses: Normal pulses.     Heart sounds: No murmur heard. Pulmonary:     Effort: Pulmonary effort is normal.     Breath sounds: No stridor. No wheezing, rhonchi or rales.  Abdominal:     General: Abdomen is flat.     Palpations: There is no mass.     Tenderness: There is no abdominal tenderness.  Musculoskeletal:        General: Normal range of motion.     Cervical back: Neck supple.  Lymphadenopathy:      Cervical: No cervical adenopathy.  Skin:    General: Skin is warm.  Neurological:     General: No focal deficit present.     Mental Status: He is alert. Mental status is at baseline.    Lab Results  Component Value Date   WBC 5.7 03/20/2021   HGB 12.3 (L) 03/20/2021   HCT 37.3 (L) 03/20/2021   PLT 177.0 03/20/2021   GLUCOSE 195 (H) 03/20/2021   CHOL  05/15/2009    125        ATP III CLASSIFICATION:  <200     mg/dL   Desirable  200-239  mg/dL   Borderline High  >=240    mg/dL   High          TRIG 188 (H) 05/15/2009   HDL 30 (L) 05/15/2009   LDLCALC  05/15/2009    57        Total Cholesterol/HDL:CHD Risk Coronary Heart Disease Risk Table                     Men   Women  1/2 Average Risk   3.4   3.3  Average Risk       5.0   4.4  2 X Average Risk   9.6   7.1  3 X Average Risk  23.4   11.0        Use the calculated Patient Ratio above and the CHD Risk Table to determine the patient's CHD Risk.        ATP III CLASSIFICATION (LDL):  <100     mg/dL   Optimal  100-129  mg/dL   Near or Above                    Optimal  130-159  mg/dL   Borderline  160-189  mg/dL   High  >190     mg/dL   Very High   ALT 9 03/20/2021   AST 12 03/20/2021   NA 139 03/20/2021   K 4.7 03/20/2021   CL 106 03/20/2021   CREATININE 1.21 03/20/2021   BUN 28 (H) 03/20/2021   CO2 29 03/20/2021   TSH 3.61 03/20/2021   INR 1.1 05/14/2009   HGBA1C 8.2 (H) 03/20/2021    DG Ribs Bilateral W/Chest  Result Date: 11/11/2016 CLINICAL DATA:  [place] several days ago with anterior chest pain particularly right-sided, former smoking history EXAM: BILATERAL RIBS AND CHEST - 4+ VIEW COMPARISON:  Chest x-ray of 05/14/2009 FINDINGS: No active infiltrate or effusion is seen. No pneumothorax is noted. Mediastinal and hilar contours are unremarkable. The heart is minimally prominent. Bilateral rib detail film were  obtained. There is very slight irregularity of the very anterior right seventh rib which could  represent a nondisplaced fracture. On the left there appears to be an old fracture involving the left seventh rib anteriorly and laterally with thickening of the cortex. No acute left rib fracture is seen. IMPRESSION: 1. Possible nondisplaced fracture of the anterior right seventh rib. 2. Probable old fracture of the left anterolateral seventh rib. 3. No active lung disease. Electronically Signed   By: [name] M.D.   On: 11/11/2016 11:43    Assessment & Plan:   [place] was seen today for anemia and diabetes.  Diagnoses and all orders for this visit:  Cachexia ([place]) -     dronabinol (MARINOL) 2.5 MG capsule; Take 1 capsule (2.5 mg total) by mouth 2 (two) times daily before lunch and supper.  Primary hypertension- His BP is well controlled.  Diabetes mellitus without complication ([place])- His blood sugar is adequately well controlled,  I am having Andre Mills start on dronabinol. I am also having him maintain his omeprazole, gabapentin, PRESCRIPTION MEDICATION, timolol, tamsulosin, pioglitazone, simvastatin, dapagliflozin propanediol, OneTouch Delica Plus [id number], OneTouch Verio, [employer], and metFORMIN. We will continue to administer cyanocobalamin.  Meds ordered this encounter  Medications   dronabinol (MARINOL) 2.5 MG capsule    Sig: Take 1 capsule (2.5 mg total) by mouth 2 (two) times daily before lunch and supper.    Dispense:  180 capsule    Refill:  1     Follow-up: Return in about 4 months (around 09/15/2021).  Scarlette Calico, MD

## 2021-05-15 NOTE — Patient Instructions (Signed)

## 2021-05-27 ENCOUNTER — Telehealth: Payer: Self-pay | Admitting: Internal Medicine

## 2021-05-27 NOTE — Telephone Encounter (Signed)
   Patients daughter calling to report penis is red  and irritated, he thinks its due to dapagliflozin propanediol (FARXIGA) 10 MG TABS tablet   Please advise

## 2021-05-28 ENCOUNTER — Encounter: Payer: Self-pay | Admitting: Internal Medicine

## 2021-05-28 ENCOUNTER — Ambulatory Visit (INDEPENDENT_AMBULATORY_CARE_PROVIDER_SITE_OTHER): Payer: Medicare Other | Admitting: Internal Medicine

## 2021-05-28 ENCOUNTER — Other Ambulatory Visit: Payer: Self-pay

## 2021-05-28 VITALS — BP 120/64 | HR 79 | Temp 98.1°F | Ht 67.0 in | Wt 142.0 lb

## 2021-05-28 DIAGNOSIS — B3742 Candidal balanitis: Secondary | ICD-10-CM | POA: Diagnosis not present

## 2021-05-28 MED ORDER — KETOCONAZOLE 2 % EX CREA: 1.0000 "application " | TOPICAL_CREAM | Freq: Two times a day (BID) | CUTANEOUS | 2 refills | Status: AC

## 2021-05-28 NOTE — Patient Instructions (Signed)
[employer] Clinical Advisor 2018 (pp. [phone]). [place], [place]: Elsevier.">  Balanitis  Balanitis is swelling and irritation of the head of the penis (glans penis). Balanitis occurs most often among males who have not had their foreskin removed (uncircumcised). In uncircumcised males, the condition may also cause inflammation of theskin around the foreskin. Balanitis sometimes causes scarring of the penis or foreskin, which can require surgery. This condition may develop because of an infection or another medicalcondition. Untreated balanitis can increase the risk of penile cancer. What are the causes? Common causes of this condition include: Poor personal hygiene, especially in uncircumcised males. Not cleaning the glans penis and foreskin well can result in a buildup of bacteria, viruses, and yeast, which can lead to infection and inflammation. Irritation and lack of air flow due to fluid (smegma) that can build up on the glans penis. Other causes include: Chemical irritation from products such as soaps or shower gels, especially those that have fragrance. Chemical irritation can also be caused by condoms, personal lubricants, petroleum jelly, spermicides, or fabric softeners. Skin conditions, such as eczema, dermatitis, and psoriasis. Allergies to medicines, such as tetracycline and sulfa drugs. What increases the risk? The following factors may make you more likely to develop this condition: Being an uncircumcised male. Having diabetes. Having other medical conditions, including liver cirrhosis, congestive heart failure, or kidney disease. Having infections, such as candidiasis, HPV (human papillomavirus), herpes simplex, gonorrhea, or syphilis. Having a tight foreskin that is difficult to pull back (retract) past the glans penis. Being severely obese. What are the signs or symptoms? Symptoms of this condition include: Discharge from under the foreskin, and pain or difficulty retracting  the foreskin. A bad smell or itchiness on the penis. Tenderness, redness, and swelling of the glans penis. A rash or sores on the glans penis or foreskin. Inability to get an erection due to pain. Difficulty urinating. Scarring of the penis or foreskin, in some cases. How is this diagnosed? This condition may be diagnosed based on a physical exam and tests of a swab ofdischarge to check for bacterial or fungal infection. You may also have blood tests to check for: Viruses that can cause balanitis. A high blood sugar (glucose) level. This could be a sign of diabetes, which can increase the risk of balanitis. How is this treated? Treatment for balanitis depends on the cause. Treatment may include: Improving personal hygiene. Your health care provider may recommend sitting in a bath of warm water that is deep enough to cover your hips and buttocks (sitz bath). Taking medicines such as: Creams or ointments to reduce swelling (steroids) or to treat an infection. Antibiotic medicine. Antifungal medicine. Having surgery to remove or cut the foreskin (circumcision). This may be done if you have scarring on the foreskin that makes it difficult to retract. Controlling other medical problems that may be causing your condition or making it worse. Follow these instructions at home: Medicines Take over-the-counter and prescription medicines only as told by your health care provider. If you were prescribed an antibiotic medicine or a cream or ointment, use it as told by your health care provider. Do not stop using your medicine, cream, or ointment even if you start to feel better. General instructions Do not have sex until the condition clears up, or until your health care provider approves. Keep your penis clean and dry. Take sitz baths as recommended by your health care provider. Avoid products that irritate your skin or make symptoms worse, such as soaps and shower  gels that have fragrance. Keep all  follow-up visits as told by your health care provider. This is important. Contact a health care provider if: Your symptoms get worse or do not improve with home care. You develop chills or a fever. You have trouble urinating. You cannot retract your foreskin. Get help right away if: You develop severe pain. You are unable to urinate. Summary Balanitis is inflammation of the head of the penis (glans penis) caused by irritation or infection. This condition is most common among uncircumcised males. Balanitis causes pain, redness, and swelling of the glans penis. Good personal hygiene is important. Treatment may include improving personal hygiene and applying creams or ointments. Contact a health care provider if your symptoms get worse or do not improve with home care. This information is not intended to replace advice given to you by your health care provider. Make sure you discuss any questions you have with your healthcare provider. Document Revised: 08/16/2019 Document Reviewed: 08/16/2019 Elsevier Patient Education  [place].

## 2021-05-28 NOTE — Progress Notes (Signed)
Subjective:  Patient ID: Andre Mills, male    DOB: [birth year]  Age: 87 y.o. MRN: [id number]  CC: Office Visit  This visit occurred during the SARS-CoV-2 public health emergency.  Safety protocols were in place, including screening questions prior to the visit, additional usage of staff PPE, and extensive cleaning of exam room while observing appropriate contact time as indicated for disinfecting solutions.    HPI Andre Mills presents for f/up --  He complains of redness under his foreskin.  He is also had a rash in his groin.  He tells me he recently saw his dermatologist and has a prescription for Diflucan.  Outpatient Medications Prior to Visit  Medication Sig Dispense Refill   Blood Glucose Monitoring Suppl ([place]) w/Device KIT Use to check blood sugar once a day. E11.9 1 kit 0   dapagliflozin propanediol (FARXIGA) 10 MG TABS tablet Take 1 tablet (10 mg total) by mouth daily before breakfast. 90 tablet 1   dronabinol (MARINOL) 2.5 MG capsule Take 1 capsule (2.5 mg total) by mouth 2 (two) times daily before lunch and supper. 180 capsule 1   gabapentin (NEURONTIN) 300 MG capsule Take 300 mg by mouth 2 (two) times daily.     glucose blood (ONETOUCH VERIO) test strip Use as instructed to check blood sugar once a day. E11.9 100 each 5   Lancets (ONETOUCH DELICA PLUS [id number]) MISC 1 Act by Does not apply route daily. E11.9 100 each 5   metFORMIN (GLUCOPHAGE) 500 MG tablet Take 1 tablet (500 mg total) by mouth 2 (two) times daily with a meal. 180 tablet 3   omeprazole (PRILOSEC) 20 MG capsule Take 20 mg by mouth daily.     pioglitazone (ACTOS) 30 MG tablet Take 30 mg by mouth daily.     PRESCRIPTION MEDICATION Place 1 drop into both eyes 2 (two) times daily. Eye drops for glaucoma     simvastatin (ZOCOR) 40 MG tablet Take 40 mg by mouth daily.     tamsulosin (FLOMAX) 0.4 MG CAPS capsule Take 0.4 mg by mouth daily.     timolol (BLOCADREN) 5 MG tablet Take 5 mg by  mouth 2 (two) times daily.     Facility-Administered Medications Prior to Visit  Medication Dose Route Frequency Provider Last Rate Last Admin   cyanocobalamin ((VITAMIN B-12)) injection 1,000 mcg  1,000 mcg Intramuscular Q30 days Janith Lima, MD   1,000 mcg at 05/02/21 1019    ROS Review of Systems  Constitutional:  Negative for chills, fatigue and fever.  Eyes: Negative.   Respiratory:  Negative for cough.   Cardiovascular:  Negative for chest pain, palpitations and leg swelling.  Gastrointestinal:  Negative for abdominal pain.  Endocrine: Negative.   Genitourinary:  Negative for difficulty urinating, dysuria, genital sores, penile discharge, penile swelling, scrotal swelling and urgency.  Musculoskeletal: Negative.   Neurological: Negative.   Hematological:  Negative for adenopathy. Does not bruise/bleed easily.  Psychiatric/Behavioral: Negative.     Objective:  BP 120/64 (BP Location: Right Arm, Patient Position: Sitting, Cuff Size: Large)   Pulse 79   Temp 98.1 F (36.7 C) (Oral)   Ht 5' 7"  (1.702 m)   Wt 142 lb (64.4 kg)   SpO2 99%   BMI 22.24 kg/m   BP Readings from Last 3 Encounters:  05/28/21 120/64  05/15/21 (!) 144/82  03/20/21 132/72    Wt Readings from Last 3 Encounters:  05/28/21 142 lb (64.4 kg)  05/15/21  139 lb (63 kg)  03/20/21 144 lb (65.3 kg)    Physical Exam Vitals reviewed. Exam conducted with a chaperone present (his wife).  HENT:     Nose: Nose normal.     Mouth/Throat:     Mouth: Mucous membranes are moist.  Eyes:     Conjunctiva/sclera: Conjunctivae normal.  Cardiovascular:     Rate and Rhythm: Normal rate and regular rhythm.     Heart sounds: No murmur heard. Pulmonary:     Effort: Pulmonary effort is normal.     Breath sounds: No stridor. No wheezing, rhonchi or rales.  Abdominal:     General: Abdomen is flat.     Palpations: There is no mass.     Tenderness: There is no abdominal tenderness. There is no guarding.   Genitourinary:    Pubic Area: No rash.      Penis: Uncircumcised. Erythema present. No phimosis, hypospadias, tenderness, discharge, swelling or lesions.      Comments: Erythema and scant exudate under the glans Musculoskeletal:     Cervical back: Neck supple.  Lymphadenopathy:     Cervical: No cervical adenopathy.  Skin:    Findings: Rash present.  Neurological:     General: No focal deficit present.     Mental Status: He is alert.    Lab Results  Component Value Date   WBC 5.7 03/20/2021   HGB 12.3 (L) 03/20/2021   HCT 37.3 (L) 03/20/2021   PLT 177.0 03/20/2021   GLUCOSE 195 (H) 03/20/2021   CHOL  05/15/2009    125        ATP III CLASSIFICATION:  <200     mg/dL   Desirable  200-239  mg/dL   Borderline High  >=240    mg/dL   High          TRIG 188 (H) 05/15/2009   HDL 30 (L) 05/15/2009   LDLCALC  05/15/2009    57        Total Cholesterol/HDL:CHD Risk Coronary Heart Disease Risk Table                     Men   Women  1/2 Average Risk   3.4   3.3  Average Risk       5.0   4.4  2 X Average Risk   9.6   7.1  3 X Average Risk  23.4   11.0        Use the calculated Patient Ratio above and the CHD Risk Table to determine the patient's CHD Risk.        ATP III CLASSIFICATION (LDL):  <100     mg/dL   Optimal  100-129  mg/dL   Near or Above                    Optimal  130-159  mg/dL   Borderline  160-189  mg/dL   High  >190     mg/dL   Very High   ALT 9 03/20/2021   AST 12 03/20/2021   NA 139 03/20/2021   K 4.7 03/20/2021   CL 106 03/20/2021   CREATININE 1.21 03/20/2021   BUN 28 (H) 03/20/2021   CO2 29 03/20/2021   TSH 3.61 03/20/2021   INR 1.1 05/14/2009   HGBA1C 8.2 (H) 03/20/2021    DG Ribs Bilateral W/Chest  Result Date: 11/11/2016 CLINICAL DATA:  [place] several days ago with anterior chest pain particularly right-sided, former smoking history  EXAM: BILATERAL RIBS AND CHEST - 4+ VIEW COMPARISON:  Chest x-ray of 05/14/2009 FINDINGS: No active infiltrate or  effusion is seen. No pneumothorax is noted. Mediastinal and hilar contours are unremarkable. The heart is minimally prominent. Bilateral rib detail film were obtained. There is very slight irregularity of the very anterior right seventh rib which could represent a nondisplaced fracture. On the left there appears to be an old fracture involving the left seventh rib anteriorly and laterally with thickening of the cortex. No acute left rib fracture is seen. IMPRESSION: 1. Possible nondisplaced fracture of the anterior right seventh rib. 2. Probable old fracture of the left anterolateral seventh rib. 3. No active lung disease. Electronically Signed   By: [name] M.D.   On: 11/11/2016 11:43    Assessment & Plan:   Andre Mills was seen today for office visit.  Diagnoses and all orders for this visit:  Candidal balanitis- He agrees to start take diflucan.  Will also treat with topical ketoconazole. -     ketoconazole (NIZORAL) 2 % cream; Apply 1 application topically 2 (two) times daily.  I am having Andre Mills start on ketoconazole. I am also having him maintain his omeprazole, gabapentin, PRESCRIPTION MEDICATION, timolol, tamsulosin, pioglitazone, simvastatin, dapagliflozin propanediol, OneTouch Delica Plus [id number], OneTouch Verio, OneTouch Verio Flex System, metFORMIN, and dronabinol. We will continue to administer cyanocobalamin.  Meds ordered this encounter  Medications   ketoconazole (NIZORAL) 2 % cream    Sig: Apply 1 application topically 2 (two) times daily.    Dispense:  60 g    Refill:  2     Follow-up: Return if symptoms worsen or fail to improve.  Scarlette Calico, MD

## 2021-05-28 NOTE — Telephone Encounter (Signed)
Pt is coming for an OV at 4pm

## 2021-06-04 ENCOUNTER — Ambulatory Visit (INDEPENDENT_AMBULATORY_CARE_PROVIDER_SITE_OTHER): Payer: Medicare Other

## 2021-06-04 DIAGNOSIS — E538 Deficiency of other specified B group vitamins: Secondary | ICD-10-CM | POA: Diagnosis not present

## 2021-06-04 DIAGNOSIS — D510 Vitamin B12 deficiency anemia due to intrinsic factor deficiency: Secondary | ICD-10-CM | POA: Diagnosis not present

## 2021-06-04 NOTE — Progress Notes (Signed)
Pt here for monthly B12 injection per Dr Ronnald Ramp.  B12 1025mg given IM in left deltoid and pt tolerated injection well.  Next B12 injection scheduled for 07/07/21.

## 2021-06-23 ENCOUNTER — Encounter (INDEPENDENT_AMBULATORY_CARE_PROVIDER_SITE_OTHER): Payer: Medicare Other | Admitting: Ophthalmology

## 2021-06-23 ENCOUNTER — Other Ambulatory Visit: Payer: Self-pay

## 2021-06-23 DIAGNOSIS — H43813 Vitreous degeneration, bilateral: Secondary | ICD-10-CM

## 2021-06-23 DIAGNOSIS — E113393 Type 2 diabetes mellitus with moderate nonproliferative diabetic retinopathy without macular edema, bilateral: Secondary | ICD-10-CM

## 2021-06-23 DIAGNOSIS — I10 Essential (primary) hypertension: Secondary | ICD-10-CM

## 2021-06-23 DIAGNOSIS — H35033 Hypertensive retinopathy, bilateral: Secondary | ICD-10-CM | POA: Diagnosis not present

## 2021-07-07 ENCOUNTER — Ambulatory Visit (INDEPENDENT_AMBULATORY_CARE_PROVIDER_SITE_OTHER): Payer: Medicare Other

## 2021-07-07 ENCOUNTER — Other Ambulatory Visit: Payer: Self-pay

## 2021-07-07 DIAGNOSIS — E538 Deficiency of other specified B group vitamins: Secondary | ICD-10-CM

## 2021-07-07 DIAGNOSIS — D510 Vitamin B12 deficiency anemia due to intrinsic factor deficiency: Secondary | ICD-10-CM | POA: Diagnosis not present

## 2021-07-07 NOTE — Progress Notes (Signed)
Pt here for monthly B12 injection per Dr Ronnald Ramp.  B12 1029mg given IM right deltoid and pt tolerated injection well.  Next B12 injection scheduled for 08/08/21.

## 2021-07-16 DIAGNOSIS — Z961 Presence of intraocular lens: Secondary | ICD-10-CM | POA: Diagnosis not present

## 2021-07-16 DIAGNOSIS — H401133 Primary open-angle glaucoma, bilateral, severe stage: Secondary | ICD-10-CM | POA: Diagnosis not present

## 2021-07-19 ENCOUNTER — Ambulatory Visit (INDEPENDENT_AMBULATORY_CARE_PROVIDER_SITE_OTHER): Payer: Medicare Other | Admitting: *Deleted

## 2021-07-19 DIAGNOSIS — E119 Type 2 diabetes mellitus without complications: Secondary | ICD-10-CM

## 2021-07-19 DIAGNOSIS — Z0000 Encounter for general adult medical examination without abnormal findings: Secondary | ICD-10-CM

## 2021-07-19 NOTE — Progress Notes (Signed)
Subjective:   Andre Mills is a 87 y.o. male who presents for Medicare Annual/Subsequent preventive examination.  I connected with  Andre Mills on 07/19/21 by audio enabled telemedicine application and verified that I am speaking with the correct person using two identifiers.   I discussed the limitations of evaluation and management by telemedicine. The patient expressed understanding and agreed to proceed.   Location of Patient: Home Location of Provider: Home Office Persons participating in visit: Andre Mills (patient), & Jari Favre, CMA  Review of Systems    Defer to PCP Cardiac Risk Factors include: none     Objective:    There were no vitals filed for this visit. There is no height or weight on file to calculate BMI.  Advanced Directives 07/19/2021 06/22/2014  Does Patient Have a Medical Advance Directive? Yes Yes  Type of Paramedic of [place];Living will [place];Living will  Does patient want to make changes to medical advance directive? No - Patient declined No - Patient declined  Copy of [place] in Chart? No - copy requested No - copy requested    Current Medications (verified) Outpatient Encounter Medications as of 07/19/2021  Medication Sig   Blood Glucose Monitoring Suppl ([place]) w/Device KIT Use to check blood sugar once a day. E11.9   dapagliflozin propanediol (FARXIGA) 10 MG TABS tablet Take 1 tablet (10 mg total) by mouth daily before breakfast.   dronabinol (MARINOL) 2.5 MG capsule Take 1 capsule (2.5 mg total) by mouth 2 (two) times daily before lunch and supper.   gabapentin (NEURONTIN) 300 MG capsule Take 300 mg by mouth 2 (two) times daily.   glucose blood (ONETOUCH VERIO) test strip Use as instructed to check blood sugar once a day. E11.9   ketoconazole (NIZORAL) 2 % cream Apply 1 application topically 2 (two) times daily.   Lancets (ONETOUCH DELICA PLUS [id number]) MISC 1  Act by Does not apply route daily. E11.9   metFORMIN (GLUCOPHAGE) 500 MG tablet Take 1 tablet (500 mg total) by mouth 2 (two) times daily with a meal.   omeprazole (PRILOSEC) 20 MG capsule Take 20 mg by mouth daily.   pioglitazone (ACTOS) 30 MG tablet Take 30 mg by mouth daily.   PRESCRIPTION MEDICATION Place 1 drop into both eyes 2 (two) times daily. Eye drops for glaucoma   simvastatin (ZOCOR) 40 MG tablet Take 40 mg by mouth daily.   tamsulosin (FLOMAX) 0.4 MG CAPS capsule Take 0.4 mg by mouth daily.   timolol (BLOCADREN) 5 MG tablet Take 5 mg by mouth 2 (two) times daily.   No facility-administered encounter medications on file as of 07/19/2021.    Allergies (verified) Flexeril [cyclobenzaprine], Limbitrol ds [chlordiazepoxide-amitriptyline], Macrodantin [nitrofurantoin macrocrystal], Mobic [meloxicam], and Septra [sulfamethoxazole-trimethoprim]   History: Past Medical History:  Diagnosis Date   Anemia    Arthritis    Diabetes mellitus without complication (HCC)    GERD (gastroesophageal reflux disease)    High cholesterol    Hypertension    Wears dentures    TOP   Wears glasses    Past Surgical History:  Procedure Laterality Date   COLONOSCOPY     COLONOSCOPY W/ BIOPSIES AND POLYPECTOMY     EYE SURGERY     both cataracts   KNEE ARTHROSCOPY     left   TONSILLECTOMY     TRIGGER FINGER RELEASE Right 06/28/2014   Procedure: RELEASE A-1 PULLEY POSSIBLE EXCISION ONE SLIP SUPERFICIALIS  RIGHT MIDDLE FINGER ;  Surgeon: Daryll Brod, MD;  Location: [facility];  Service: Orthopedics;  Laterality: Right;   No family history on file. Social History   Socioeconomic History   Marital status: Married    Spouse name: Not on file   Number of children: Not on file   Years of education: Not on file   Highest education level: Not on file  Occupational History   Not on file  Tobacco Use   Smoking status: Former    Types: Cigarettes    Quit date: 06/23/1959    Years  since quitting: 62.1   Smokeless tobacco: Never  Substance and Sexual Activity   Alcohol use: No   Drug use: No   Sexual activity: Not on file  Other Topics Concern   Not on file  Social History Narrative   Not on file   Social Determinants of Health   Financial Resource Strain: Low Risk    Difficulty of Paying Living Expenses: Not hard at all  Food Insecurity: No Food Insecurity   Worried About Charity fundraiser in the Last Year: Never true   [place] in the Last Year: Never true  Transportation Needs: No Transportation Needs   Lack of Transportation (Medical): No   Lack of Transportation (Non-Medical): No  Physical Activity: Insufficiently Active   Days of Exercise per Week: 5 days   Minutes of Exercise per Session: 10 min  Stress: No Stress Concern Present   Feeling of Stress : Not at all  Social Connections: Socially Integrated   Frequency of Communication with Friends and Family: More than three times a week   Frequency of Social Gatherings with Friends and Family: Once a week   Attends Religious Services: More than 4 times per year   Active Member of [employer] or Organizations: Yes   Attends Archivist Meetings: Never   Marital Status: Married    Tobacco Counseling Counseling given: Not Answered   Clinical Intake:  Pre-visit preparation completed: Yes  Pain : No/denies pain     BMI - recorded: 22.24 Nutritional Status: BMI of 19-24  Normal Diabetes: Yes CBG done?: No Did pt. bring in CBG monitor from home?: No  How often do you need to have someone help you when you read instructions, pamphlets, or other written materials from your doctor or pharmacy?: 2 - Rarely  Diabetic? Yes  Interpreter Needed?: No      Activities of Daily Living In your present state of health, do you have any difficulty performing the following activities: 07/19/2021 12/16/2020  Hearing? Y N  Vision? N N  Difficulty concentrating or making decisions? N N   Walking or climbing stairs? Y N  Dressing or bathing? N N  Doing errands, shopping? Y N  Preparing Food and eating ? N -  Using the Toilet? N -  In the past six months, have you accidently leaked urine? Y -  Do you have problems with loss of bowel control? N -  Managing your Medications? N -  Managing your Finances? N -  Housekeeping or managing your Housekeeping? N -  Some recent data might be hidden    Patient Care Team: Janith Lima, MD as PCP - General (Internal Medicine)  Indicate any recent Medical Services you may have received from other than [facility] providers in the past year (date may be approximate).     Assessment:   This is a routine wellness examination for  Miroslav.  Hearing/Vision screen No results found.  Dietary issues and exercise activities discussed: Current Exercise Habits: The patient does not participate in regular exercise at present, Exercise limited by: None identified   Goals Addressed   None    Depression Screen PHQ 2/9 Scores 07/19/2021 08/15/2020  PHQ - 2 Score 0 0    Fall Risk Fall Risk  07/19/2021 03/03/2021  Falls in the past year? 0 1  Number falls in past yr: 0 1  Injury with Fall? 0 1  Comment - hurt tailbone    FALL RISK PREVENTION PERTAINING TO THE HOME:  Any stairs in or around the home? No  If so, are there any without handrails? No  Home free of loose throw rugs in walkways, pet beds, electrical cords, etc? No  Adequate lighting in your home to reduce risk of falls? Yes   ASSISTIVE DEVICES UTILIZED TO PREVENT FALLS:  Life alert? No  Use of a cane, walker or w/c? Yes  Grab bars in the bathroom? No  Shower chair or bench in shower? Yes  Elevated toilet seat or a handicapped toilet? Yes   TIMED UP AND GO:  Was the test performed?  N/A .  Length of time to ambulate 10 feet: N/A sec.    Cognitive Function:     6CIT Screen 07/19/2021  What Year? 0 points  What month? 0 points  What time? 0 points  Count back from 20  0 points  Months in reverse 4 points  Repeat phrase 10 points  Total Score 14    Immunizations Immunization History  Administered Date(s) Administered   Fluad Quad(high Dose 65+) 08/15/2020   Moderna Sars-Covid-2 Vaccination 11/29/2019, 12/27/2019, 10/06/2020, 03/11/2021   Pneumococcal Conjugate-13 07/17/2014   Pneumococcal Polysaccharide-23 10/27/2000, 10/21/2018   Tdap 10/08/2015, 10/07/2017    TDAP status: Up to date  Flu Vaccine status: Due, Education has been provided regarding the importance of this vaccine. Advised may receive this vaccine at local pharmacy or Health Dept. Aware to provide a copy of the vaccination record if obtained from local pharmacy or Health Dept. Verbalized acceptance and understanding.  Pneumococcal vaccine status: Up to date  Covid-19 vaccine status: Completed vaccines  Qualifies for Shingles Vaccine? Yes  Zostavax completed No   Shingrix Completed?: No.    Education has been provided regarding the importance of this vaccine. Patient has been advised to call insurance company to determine out of pocket expense if they have not yet received this vaccine. Advised may also receive vaccine at local pharmacy or Health Dept. Verbalized acceptance and understanding.  Screening Tests Health Maintenance  Topic Date Due   URINE MICROALBUMIN  Never done   Zoster Vaccines- Shingrix (1 of 2) 10/18/2021 (Originally 03/29/1975)   INFLUENZA VACCINE  02/06/2022 (Originally 06/09/2021)   FOOT EXAM  08/15/2021   HEMOGLOBIN A1C  09/20/2021   OPHTHALMOLOGY EXAM  01/20/2022   TETANUS/TDAP  10/08/2027   COVID-19 Vaccine  Completed   PNA vac Low Risk Adult  Completed   HPV VACCINES  Aged Out    Health Maintenance  Health Maintenance Due  Topic Date Due   URINE MICROALBUMIN  Never done    Colorectal cancer screening: No longer required.   Lung Cancer Screening: (Low Dose CT Chest recommended if Age 38-80 years, 30 pack-year currently smoking OR have quit w/in  15years.) does not qualify.   Lung Cancer Screening Referral: N/A  Additional Screening:  Hepatitis C Screening: does not qualify; Not Completed   Vision  Screening: Recommended annual ophthalmology exams for early detection of glaucoma and other disorders of the eye. Is the patient up to date with their annual eye exam?  Yes  Who is the provider or what is the name of the office in which the patient attends annual eye exams? Dr Zigmund Daniel If pt is not established with a provider, would they like to be referred to a provider to establish care? No .   Dental Screening: Recommended annual dental exams for proper oral hygiene  Community Resource Referral / Chronic Care Management: CRR required this visit?  No   CCM required this visit?  No      Plan:     I have personally reviewed and noted the following in the patient's chart:   Medical and social history Use of alcohol, tobacco or illicit drugs  Current medications and supplements including opioid prescriptions. Patient is not currently taking opioid prescriptions. Functional ability and status Nutritional status Physical activity Advanced directives List of other physicians Hospitalizations, surgeries, and ER visits in previous 12 months Vitals Screenings to include cognitive, depression, and falls Referrals and appointments  In addition, I have reviewed and discussed with patient certain preventive protocols, quality metrics, and best practice recommendations. A written personalized care plan for preventive services as well as general preventive health recommendations were provided to patient.     Cannon Kettle, [place]   07/19/2021   Nurse Notes: 16 minutes non face to face   Mr. Cellucci , Thank you for taking time to come for your Medicare Wellness Visit. I appreciate your ongoing commitment to your health goals. Please review the following plan we discussed and let me know if I can assist you in the future.   These are  the goals we discussed:  Goals   None     This is a list of the screening recommended for you and due dates:  Health Maintenance  Topic Date Due   Urine Protein Check  Never done   Zoster (Shingles) Vaccine (1 of 2) 10/18/2021*   Flu Shot  02/06/2022*   Complete foot exam   08/15/2021   Hemoglobin A1C  09/20/2021   Eye exam for diabetics  01/20/2022   Tetanus Vaccine  10/08/2027   COVID-19 Vaccine  Completed   Pneumonia vaccines  Completed   HPV Vaccine  Aged Out  *Topic was postponed. The date shown is not the original due date.

## 2021-08-08 ENCOUNTER — Ambulatory Visit (INDEPENDENT_AMBULATORY_CARE_PROVIDER_SITE_OTHER): Payer: Medicare Other

## 2021-08-08 ENCOUNTER — Other Ambulatory Visit: Payer: Self-pay

## 2021-08-08 DIAGNOSIS — D510 Vitamin B12 deficiency anemia due to intrinsic factor deficiency: Secondary | ICD-10-CM

## 2021-08-08 MED ORDER — CYANOCOBALAMIN 1000 MCG/ML IJ SOLN
1000.0000 ug | Freq: Once | INTRAMUSCULAR | Status: AC
  Administered 2021-08-08: 1000 ug via INTRAMUSCULAR

## 2021-08-08 NOTE — Progress Notes (Addendum)
B12 given w/o any complications.  Medical screening examination/treatment/procedure(s) were performed by non-physician practitioner and as supervising physician I was immediately available for consultation/collaboration.  I agree with above. Lew Dawes, MD

## 2021-09-05 ENCOUNTER — Other Ambulatory Visit: Payer: Self-pay

## 2021-09-05 ENCOUNTER — Ambulatory Visit (INDEPENDENT_AMBULATORY_CARE_PROVIDER_SITE_OTHER): Payer: Medicare Other

## 2021-09-05 DIAGNOSIS — Z23 Encounter for immunization: Secondary | ICD-10-CM

## 2021-09-05 NOTE — Progress Notes (Signed)
Pt given HD flu vacc w/o any complications.

## 2021-09-17 ENCOUNTER — Ambulatory Visit (INDEPENDENT_AMBULATORY_CARE_PROVIDER_SITE_OTHER): Payer: Medicare Other | Admitting: Internal Medicine

## 2021-09-17 ENCOUNTER — Other Ambulatory Visit: Payer: Self-pay

## 2021-09-17 ENCOUNTER — Encounter: Payer: Self-pay | Admitting: Internal Medicine

## 2021-09-17 VITALS — BP 116/64 | HR 66 | Temp 97.6°F | Resp 16 | Ht 67.0 in | Wt 144.0 lb

## 2021-09-17 DIAGNOSIS — E119 Type 2 diabetes mellitus without complications: Secondary | ICD-10-CM

## 2021-09-17 DIAGNOSIS — D510 Vitamin B12 deficiency anemia due to intrinsic factor deficiency: Secondary | ICD-10-CM | POA: Diagnosis not present

## 2021-09-17 DIAGNOSIS — N1832 Chronic kidney disease, stage 3b: Secondary | ICD-10-CM

## 2021-09-17 DIAGNOSIS — I10 Essential (primary) hypertension: Secondary | ICD-10-CM | POA: Diagnosis not present

## 2021-09-17 DIAGNOSIS — N3281 Overactive bladder: Secondary | ICD-10-CM | POA: Diagnosis not present

## 2021-09-17 LAB — HEMOGLOBIN A1C: Hgb A1c MFr Bld: 8.4 % — ABNORMAL HIGH (ref 4.6–6.5)

## 2021-09-17 LAB — CBC WITH DIFFERENTIAL/PLATELET
Basophils Absolute: 0.1 10*3/uL (ref 0.0–0.1)
Basophils Relative: 1.2 % (ref 0.0–3.0)
Eosinophils Absolute: 0.9 10*3/uL — ABNORMAL HIGH (ref 0.0–0.7)
Eosinophils Relative: 12.9 % — ABNORMAL HIGH (ref 0.0–5.0)
HCT: 37.8 % — ABNORMAL LOW (ref 39.0–52.0)
Hemoglobin: 12 g/dL — ABNORMAL LOW (ref 13.0–17.0)
Lymphocytes Relative: 28.1 % (ref 12.0–46.0)
Lymphs Abs: 2 10*3/uL (ref 0.7–4.0)
MCHC: 31.7 g/dL (ref 30.0–36.0)
MCV: 90.5 fl (ref 78.0–100.0)
Monocytes Absolute: 0.5 10*3/uL (ref 0.1–1.0)
Monocytes Relative: 7.6 % (ref 3.0–12.0)
Neutro Abs: 3.6 10*3/uL (ref 1.4–7.7)
Neutrophils Relative %: 50.2 % (ref 43.0–77.0)
Platelets: 192 10*3/uL (ref 150.0–400.0)
RBC: 4.18 Mil/uL — ABNORMAL LOW (ref 4.22–5.81)
RDW: 16.3 % — ABNORMAL HIGH (ref 11.5–15.5)
WBC: 7.1 10*3/uL (ref 4.0–10.5)

## 2021-09-17 LAB — MICROALBUMIN / CREATININE URINE RATIO
Creatinine,U: 27.1 mg/dL
Microalb Creat Ratio: 2.6 mg/g (ref 0.0–30.0)
Microalb, Ur: <0.7 mg/dL (ref 0.0–1.9)

## 2021-09-17 LAB — BASIC METABOLIC PANEL
BUN: 29 mg/dL — ABNORMAL HIGH (ref 6–23)
CO2: 29 mEq/L (ref 19–32)
Calcium: 9.2 mg/dL (ref 8.4–10.5)
Chloride: 106 mEq/L (ref 96–112)
Creatinine, Ser: 1.07 mg/dL (ref 0.40–1.50)
GFR: 59.42 mL/min — ABNORMAL LOW (ref >60.00)
Glucose, Bld: 156 mg/dL — ABNORMAL HIGH (ref 70–99)
Potassium: 4.5 mEq/L (ref 3.5–5.1)
Sodium: 140 mEq/L (ref 135–145)

## 2021-09-17 MED ORDER — SOLIFENACIN SUCCINATE 5 MG PO TABS: 5.0000 mg | ORAL_TABLET | Freq: Every day | ORAL | 1 refills | Status: DC

## 2021-09-17 NOTE — Patient Instructions (Signed)
Type 2 Diabetes Mellitus, Diagnosis, Adult ?Type 2 diabetes (type 2 diabetes mellitus) is a long-term, or chronic, disease. In type 2 diabetes, one or both of these problems may be present: ?The pancreas does not make enough of a hormone called insulin. ?Cells in the body do not respond properly to the insulin that the body makes (insulin resistance). ?Normally, insulin allows blood sugar (glucose) to enter cells in the body. The cells use glucose for energy. Insulin resistance or lack of insulin causes excess glucose to build up in the blood instead of going into cells. This causes high blood glucose (hyperglycemia).  ?What are the causes? ?The exact cause of type 2 diabetes is not known. ?What increases the risk? ?The following factors may make you more likely to develop this condition: ?Having a family member with type 2 diabetes. ?Being overweight or obese. ?Being inactive (sedentary). ?Having been diagnosed with insulin resistance. ?Having a history of prediabetes, diabetes when you were pregnant (gestational diabetes), or polycystic ovary syndrome (PCOS). ?What are the signs or symptoms? ?In the early stage of this condition, you may not have symptoms. Symptoms develop slowly and may include: ?Increased thirst or hunger. ?Increased urination. ?Unexplained weight loss. ?Tiredness (fatigue) or weakness. ?Vision changes, such as blurry vision. ?Dark patches on the skin. ?How is this diagnosed? ?This condition is diagnosed based on your symptoms, your medical history, a physical exam, and your blood glucose level. Your blood glucose may be checked with one or more of the following blood tests: ?A fasting blood glucose (FBG) test. You will not be allowed to eat (you will fast) for 8 hours or longer before a blood sample is taken. ?A random blood glucose test. This test checks blood glucose at any time of day regardless of when you ate. ?An A1C (hemoglobin A1C) blood test. This test provides information about blood  glucose levels over the previous 2-3 months. ?An oral glucose tolerance test (OGTT). This test measures your blood glucose at two times: ?After fasting. This is your baseline blood glucose level. ?Two hours after drinking a beverage that contains glucose. ?You may be diagnosed with type 2 diabetes if: ?Your fasting blood glucose level is 126 mg/dL (7.0 mmol/L) or higher. ?Your random blood glucose level is 200 mg/dL (11.1 mmol/L) or higher. ?Your A1C level is 6.5% or higher. ?Your oral glucose tolerance test result is higher than 200 mg/dL (11.1 mmol/L). ?These blood tests may be repeated to confirm your diagnosis. ?How is this treated? ?Your treatment may be managed by a specialist called an endocrinologist. Type 2 diabetes may be treated by following instructions from your health care provider about: ?Making dietary and lifestyle changes. These may include: ?Following a personalized nutrition plan that is developed by a registered dietitian. ?Exercising regularly. ?Finding ways to manage stress. ?Checking your blood glucose level as often as told. ?Taking diabetes medicines or insulin daily. This helps to keep your blood glucose levels in the healthy range. ?Taking medicines to help prevent complications from diabetes. Medicines may include: ?Aspirin. ?Medicine to lower cholesterol. ?Medicine to control blood pressure. ?Your health care provider will set treatment goals for you. Your goals will be based on your age, other medical conditions you have, and how you respond to diabetes treatment. Generally, the goal of treatment is to maintain the following blood glucose levels: ?Before meals: 80-130 mg/dL (4.4-7.2 mmol/L). ?After meals: below 180 mg/dL (10 mmol/L). ?A1C level: less than 7%. ?Follow these instructions at home: ?Questions to ask your health care provider ?  Consider asking the following questions: ?Should I meet with a certified diabetes care and education specialist? ?What diabetes medicines do I need,  and when should I take them? ?What equipment will I need to manage my diabetes at home? ?How often do I need to check my blood glucose? ?Where can I find a support group for people with diabetes? ?What number can I call if I have questions? ?When is my next appointment? ?General instructions ?Take over-the-counter and prescription medicines only as told by your health care provider. ?Keep all follow-up visits. This is important. ?Where to find more information ?For help and guidance and for more information about diabetes, please visit: ?American Diabetes Association (ADA): www.diabetes.org ?American Association of Diabetes Care and Education Specialists (ADCES): www.diabeteseducator.org ?International Diabetes Federation (IDF): www.idf.org ?Contact a health care provider if: ?Your blood glucose is at or above 240 mg/dL (13.3 mmol/L) for 2 days in a row. ?You have been sick or have had a fever for 2 days or longer, and you are not getting better. ?You have any of the following problems for more than 6 hours: ?You cannot eat or drink. ?You have nausea and vomiting. ?You have diarrhea. ?Get help right away if: ?You have severe hypoglycemia. This means your blood glucose is lower than 54 mg/dL (3.0 mmol/L). ?You become confused or you have trouble thinking clearly. ?You have difficulty breathing. ?You have moderate or large ketone levels in your urine. ?These symptoms may represent a serious problem that is an emergency. Do not wait to see if the symptoms will go away. Get medical help right away. Call your local emergency services (911 in the [place] Do not drive yourself to the hospital. ?Summary ?Type 2 diabetes mellitus is a long-term, or chronic, disease. In type 2 diabetes, the pancreas does not make enough of a hormone called insulin, or cells in the body do not respond properly to insulin that the body makes. ?This condition is treated by making dietary and lifestyle changes and taking diabetes medicines or  insulin. ?Your health care provider will set treatment goals for you. Your goals will be based on your age, other medical conditions you have, and how you respond to diabetes treatment. ?Keep all follow-up visits. This is important. ?This information is not intended to replace advice given to you by your health care provider. Make sure you discuss any questions you have with your health care provider. ?Document Revised: 01/20/2021 Document Reviewed: 01/20/2021 ?Elsevier Patient Education ? 2022 [employer] ? ?

## 2021-09-17 NOTE — Progress Notes (Signed)
Subjective:  Patient ID: Andre Mills, male    DOB: 1925/10/04  Age: 87 y.o. MRN: [id number]  CC: Anemia and Diabetes  This visit occurred during the SARS-CoV-2 public health emergency.  Safety protocols were in place, including screening questions prior to the visit, additional usage of staff PPE, and extensive cleaning of exam room while observing appropriate contact time as indicated for disinfecting solutions.    HPI ORIEN MAYHALL presents for f/up -   He complains of urinary frequency and urgency.  He denies polyphagia.  He is active and denies chest pain, shortness of breath, dizziness, or lightheadedness.  Outpatient Medications Prior to Visit  Medication Sig Dispense Refill   Blood Glucose Monitoring Suppl ([place]) w/Device KIT Use to check blood sugar once a day. E11.9 1 kit 0   dapagliflozin propanediol (FARXIGA) 10 MG TABS tablet Take 1 tablet (10 mg total) by mouth daily before breakfast. 90 tablet 1   dronabinol (MARINOL) 2.5 MG capsule Take 1 capsule (2.5 mg total) by mouth 2 (two) times daily before lunch and supper. 180 capsule 1   gabapentin (NEURONTIN) 300 MG capsule Take 300 mg by mouth 2 (two) times daily.     glucose blood (ONETOUCH VERIO) test strip Use as instructed to check blood sugar once a day. E11.9 100 each 5   ketoconazole (NIZORAL) 2 % cream Apply 1 application topically 2 (two) times daily. 60 g 2   Lancets (ONETOUCH DELICA PLUS [id number]) MISC 1 Act by Does not apply route daily. E11.9 100 each 5   metFORMIN (GLUCOPHAGE) 500 MG tablet Take 1 tablet (500 mg total) by mouth 2 (two) times daily with a meal. 180 tablet 3   omeprazole (PRILOSEC) 20 MG capsule Take 20 mg by mouth daily.     pioglitazone (ACTOS) 30 MG tablet Take 30 mg by mouth daily.     PRESCRIPTION MEDICATION Place 1 drop into both eyes 2 (two) times daily. Eye drops for glaucoma     simvastatin (ZOCOR) 40 MG tablet Take 40 mg by mouth daily.     tamsulosin (FLOMAX) 0.4  MG CAPS capsule Take 0.4 mg by mouth daily.     timolol (BLOCADREN) 5 MG tablet Take 5 mg by mouth 2 (two) times daily.     No facility-administered medications prior to visit.    ROS Review of Systems  Constitutional: Negative.  Negative for fatigue and unexpected weight change.  HENT: Negative.  Negative for sore throat.   Eyes: Negative.   Respiratory:  Negative for cough, chest tightness, shortness of breath and wheezing.   Cardiovascular:  Negative for chest pain and palpitations.  Gastrointestinal:  Negative for abdominal pain, diarrhea and nausea.  Endocrine: Positive for polyuria. Negative for polydipsia and polyphagia.  Genitourinary:  Positive for frequency and urgency. Negative for difficulty urinating, dysuria, penile discharge, scrotal swelling and testicular pain.  Musculoskeletal: Negative.  Negative for arthralgias and myalgias.  Skin: Negative.  Negative for rash.  Neurological: Negative.  Negative for dizziness, weakness and light-headedness.  Hematological:  Negative for adenopathy. Does not bruise/bleed easily.  Psychiatric/Behavioral: Negative.     Objective:  BP 116/64 (BP Location: Right Arm, Patient Position: Sitting, Cuff Size: Large)   Pulse 66   Temp 97.6 F (36.4 C) (Oral)   Resp 16   Ht _0  (1.702 m)   Wt 144 lb (65.3 kg)   SpO2 95%   BMI 22.55 kg/m   BP Readings from Last 3 Encounters:  09/17/21 116/64  05/28/21 120/64  05/15/21 (!) 144/82    Wt Readings from Last 3 Encounters:  09/17/21 144 lb (65.3 kg)  05/28/21 142 lb (64.4 kg)  05/15/21 139 lb (63 kg)    Physical Exam Vitals reviewed.  HENT:     Nose: Nose normal.     Mouth/Throat:     Mouth: Mucous membranes are moist.  Eyes:     Conjunctiva/sclera: Conjunctivae normal.  Cardiovascular:     Rate and Rhythm: Normal rate.     Heart sounds: No murmur heard. Pulmonary:     Effort: Pulmonary effort is normal.     Breath sounds: No stridor. No wheezing, rhonchi or rales.   Abdominal:     General: Abdomen is flat.     Palpations: There is no mass.     Tenderness: There is no abdominal tenderness. There is no guarding.     Hernia: No hernia is present.  Musculoskeletal:        General: Normal range of motion.     Cervical back: Neck supple.     Right lower leg: No edema.  Lymphadenopathy:     Cervical: No cervical adenopathy.  Neurological:     General: No focal deficit present.     Mental Status: He is alert. Mental status is at baseline.  Psychiatric:        Mood and Affect: Mood normal.        Behavior: Behavior normal.    Lab Results  Component Value Date   WBC 7.1 09/17/2021   HGB 12.0 (L) 09/17/2021   HCT 37.8 (L) 09/17/2021   PLT 192.0 09/17/2021   GLUCOSE 156 (H) 09/17/2021   CHOL  05/15/2009    125        ATP III CLASSIFICATION:  <200     mg/dL   Desirable  200-239  mg/dL   Borderline High  >=240    mg/dL   High          TRIG 188 (H) 05/15/2009   HDL 30 (L) 05/15/2009   LDLCALC  05/15/2009    57        Total Cholesterol/HDL:CHD Risk Coronary Heart Disease Risk Table                     Men   Women  1/2 Average Risk   3.4   3.3  Average Risk       5.0   4.4  2 X Average Risk   9.6   7.1  3 X Average Risk  23.4   11.0        Use the calculated Patient Ratio above and the CHD Risk Table to determine the patient's CHD Risk.        ATP III CLASSIFICATION (LDL):  <100     mg/dL   Optimal  100-129  mg/dL   Near or Above                    Optimal  130-159  mg/dL   Borderline  160-189  mg/dL   High  >190     mg/dL   Very High   ALT 9 03/20/2021   AST 12 03/20/2021   NA 140 09/17/2021   K 4.5 09/17/2021   CL 106 09/17/2021   CREATININE 1.07 09/17/2021   BUN 29 (H) 09/17/2021   CO2 29 09/17/2021   TSH 3.61 03/20/2021   INR 1.1 05/14/2009   HGBA1C 8.4 (H)  09/17/2021   MICROALBUR <0.7 09/17/2021    DG Ribs Bilateral W/Chest  Result Date: 11/11/2016 CLINICAL DATA:  [place] several days ago with anterior chest pain  particularly right-sided, former smoking history EXAM: BILATERAL RIBS AND CHEST - 4+ VIEW COMPARISON:  Chest x-ray of 05/14/2009 FINDINGS: No active infiltrate or effusion is seen. No pneumothorax is noted. Mediastinal and hilar contours are unremarkable. The heart is minimally prominent. Bilateral rib detail film were obtained. There is very slight irregularity of the very anterior right seventh rib which could represent a nondisplaced fracture. On the left there appears to be an old fracture involving the left seventh rib anteriorly and laterally with thickening of the cortex. No acute left rib fracture is seen. IMPRESSION: 1. Possible nondisplaced fracture of the anterior right seventh rib. 2. Probable old fracture of the left anterolateral seventh rib. 3. No active lung disease. Electronically Signed   By: [name] M.D.   On: 11/11/2016 11:43    Assessment & Plan:   Alain was seen today for anemia and diabetes.  Diagnoses and all orders for this visit:  Primary hypertension- His blood pressure is adequately well controlled. -     Basic metabolic panel; Future -     Basic metabolic panel  Stage 3b chronic kidney disease ([place])- His renal function is stable. 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  Will continue parenteral B12 replacement therapy. -     CBC with Differential/Platelet; Future -     CBC with Differential/Platelet  Diabetes mellitus without complication ([place])- His blood sugar is adequately well controlled. -     Hemoglobin A1c; Future -     Hemoglobin A1c -     Microalbumin / creatinine urine ratio -     HM Diabetes Foot Exam  OAB (overactive bladder) -     solifenacin (VESICARE) 5 MG tablet; Take 1 tablet (5 mg total) by mouth daily.  I am having Johntae C. Rooks start on solifenacin. I am also having him maintain his omeprazole, gabapentin, PRESCRIPTION  MEDICATION, timolol, tamsulosin, pioglitazone, simvastatin, dapagliflozin propanediol, OneTouch Delica Plus [id number], OneTouch Verio, OneTouch Verio Flex System, metFORMIN, dronabinol, and ketoconazole.  Meds ordered this encounter  Medications   solifenacin (VESICARE) 5 MG tablet    Sig: Take 1 tablet (5 mg total) by mouth daily.    Dispense:  90 tablet    Refill:  1      Follow-up: Return in about 6 months (around 03/17/2022).  Scarlette Calico, MD

## 2021-10-06 DIAGNOSIS — Z23 Encounter for immunization: Secondary | ICD-10-CM | POA: Diagnosis not present

## 2021-10-07 ENCOUNTER — Encounter: Payer: Self-pay | Admitting: Internal Medicine

## 2021-10-07 DIAGNOSIS — H00024 Hordeolum internum left upper eyelid: Secondary | ICD-10-CM | POA: Diagnosis not present

## 2021-10-08 ENCOUNTER — Ambulatory Visit: Payer: Medicare Other

## 2021-10-10 ENCOUNTER — Other Ambulatory Visit: Payer: Self-pay

## 2021-10-10 ENCOUNTER — Ambulatory Visit (INDEPENDENT_AMBULATORY_CARE_PROVIDER_SITE_OTHER): Payer: Medicare Other

## 2021-10-10 DIAGNOSIS — D510 Vitamin B12 deficiency anemia due to intrinsic factor deficiency: Secondary | ICD-10-CM | POA: Diagnosis not present

## 2021-10-10 DIAGNOSIS — Z23 Encounter for immunization: Secondary | ICD-10-CM

## 2021-10-10 MED ORDER — CYANOCOBALAMIN 1000 MCG/ML IJ SOLN
1000.0000 ug | Freq: Once | INTRAMUSCULAR | Status: AC
  Administered 2021-10-10: 1000 ug via INTRAMUSCULAR

## 2021-10-10 NOTE — Progress Notes (Addendum)
B12 given w/o any complications. 

## 2021-10-21 ENCOUNTER — Encounter: Payer: Self-pay | Admitting: Internal Medicine

## 2021-10-22 ENCOUNTER — Other Ambulatory Visit: Payer: Self-pay | Admitting: Internal Medicine

## 2021-10-22 DIAGNOSIS — N3281 Overactive bladder: Secondary | ICD-10-CM

## 2021-10-23 ENCOUNTER — Other Ambulatory Visit: Payer: Self-pay

## 2021-10-23 DIAGNOSIS — N3281 Overactive bladder: Secondary | ICD-10-CM

## 2021-10-28 DIAGNOSIS — H0014 Chalazion left upper eyelid: Secondary | ICD-10-CM | POA: Diagnosis not present

## 2021-11-12 ENCOUNTER — Other Ambulatory Visit: Payer: Self-pay

## 2021-11-12 ENCOUNTER — Ambulatory Visit (INDEPENDENT_AMBULATORY_CARE_PROVIDER_SITE_OTHER): Payer: Medicare Other

## 2021-11-12 DIAGNOSIS — D510 Vitamin B12 deficiency anemia due to intrinsic factor deficiency: Secondary | ICD-10-CM

## 2021-11-12 MED ORDER — CYANOCOBALAMIN 1000 MCG/ML IJ SOLN
1000.0000 ug | Freq: Once | INTRAMUSCULAR | Status: AC
  Administered 2021-11-12: 1000 ug via INTRAMUSCULAR

## 2021-11-12 NOTE — Progress Notes (Signed)
Pt was given B12 w/o any complications. 

## 2021-11-17 ENCOUNTER — Encounter (INDEPENDENT_AMBULATORY_CARE_PROVIDER_SITE_OTHER): Payer: Medicare Other | Admitting: Ophthalmology

## 2021-11-26 DIAGNOSIS — H401133 Primary open-angle glaucoma, bilateral, severe stage: Secondary | ICD-10-CM | POA: Diagnosis not present

## 2021-11-26 DIAGNOSIS — E113313 Type 2 diabetes mellitus with moderate nonproliferative diabetic retinopathy with macular edema, bilateral: Secondary | ICD-10-CM | POA: Diagnosis not present

## 2021-11-26 DIAGNOSIS — H0014 Chalazion left upper eyelid: Secondary | ICD-10-CM | POA: Diagnosis not present

## 2021-11-26 DIAGNOSIS — H35033 Hypertensive retinopathy, bilateral: Secondary | ICD-10-CM | POA: Diagnosis not present

## 2021-12-02 ENCOUNTER — Other Ambulatory Visit: Payer: Self-pay | Admitting: Internal Medicine

## 2021-12-02 ENCOUNTER — Encounter: Payer: Self-pay | Admitting: Internal Medicine

## 2021-12-02 DIAGNOSIS — N3281 Overactive bladder: Secondary | ICD-10-CM

## 2021-12-15 ENCOUNTER — Other Ambulatory Visit: Payer: Self-pay

## 2021-12-15 ENCOUNTER — Ambulatory Visit (INDEPENDENT_AMBULATORY_CARE_PROVIDER_SITE_OTHER): Payer: Medicare Other

## 2021-12-15 DIAGNOSIS — D510 Vitamin B12 deficiency anemia due to intrinsic factor deficiency: Secondary | ICD-10-CM

## 2021-12-15 MED ORDER — CYANOCOBALAMIN 1000 MCG/ML IJ SOLN
1000.0000 ug | Freq: Once | INTRAMUSCULAR | Status: AC
  Administered 2021-12-15: 1000 ug via INTRAMUSCULAR

## 2021-12-15 NOTE — Progress Notes (Signed)
Pt was given B12 w/o any complications. 

## 2021-12-25 DIAGNOSIS — L570 Actinic keratosis: Secondary | ICD-10-CM | POA: Diagnosis not present

## 2021-12-25 DIAGNOSIS — L308 Other specified dermatitis: Secondary | ICD-10-CM | POA: Diagnosis not present

## 2021-12-25 DIAGNOSIS — Z85828 Personal history of other malignant neoplasm of skin: Secondary | ICD-10-CM | POA: Diagnosis not present

## 2021-12-25 DIAGNOSIS — B372 Candidiasis of skin and nail: Secondary | ICD-10-CM | POA: Diagnosis not present

## 2022-01-12 ENCOUNTER — Ambulatory Visit (INDEPENDENT_AMBULATORY_CARE_PROVIDER_SITE_OTHER): Payer: Medicare Other

## 2022-01-12 ENCOUNTER — Other Ambulatory Visit: Payer: Self-pay

## 2022-01-12 DIAGNOSIS — D510 Vitamin B12 deficiency anemia due to intrinsic factor deficiency: Secondary | ICD-10-CM

## 2022-01-12 MED ORDER — CYANOCOBALAMIN 1000 MCG/ML IJ SOLN
1000.0000 ug | Freq: Once | INTRAMUSCULAR | Status: AC
  Administered 2022-01-12: 1000 ug via INTRAMUSCULAR

## 2022-01-12 NOTE — Progress Notes (Signed)
After obtaining consent, and per orders of Dr. Ronnald Ramp, injection of B-12 given by Archie Balboa in left deltoid. Patient tolerated injection well and instructed to report any adverse reaction to me immediately. ? ?

## 2022-01-15 ENCOUNTER — Encounter: Payer: Self-pay | Admitting: Internal Medicine

## 2022-01-21 ENCOUNTER — Other Ambulatory Visit: Payer: Self-pay

## 2022-01-21 ENCOUNTER — Ambulatory Visit (INDEPENDENT_AMBULATORY_CARE_PROVIDER_SITE_OTHER): Payer: Medicare Other | Admitting: Internal Medicine

## 2022-01-21 ENCOUNTER — Encounter: Payer: Self-pay | Admitting: Internal Medicine

## 2022-01-21 VITALS — BP 132/70 | HR 66 | Temp 98.7°F | Resp 16 | Ht 67.0 in | Wt 142.5 lb

## 2022-01-21 DIAGNOSIS — B3742 Candidal balanitis: Secondary | ICD-10-CM

## 2022-01-21 DIAGNOSIS — D510 Vitamin B12 deficiency anemia due to intrinsic factor deficiency: Secondary | ICD-10-CM | POA: Diagnosis not present

## 2022-01-21 DIAGNOSIS — E119 Type 2 diabetes mellitus without complications: Secondary | ICD-10-CM

## 2022-01-21 DIAGNOSIS — I10 Essential (primary) hypertension: Secondary | ICD-10-CM

## 2022-01-21 DIAGNOSIS — N1832 Type 2 diabetes mellitus with diabetic chronic kidney disease: Secondary | ICD-10-CM | POA: Insufficient documentation

## 2022-01-21 DIAGNOSIS — E1122 Type 2 diabetes mellitus with diabetic chronic kidney disease: Secondary | ICD-10-CM

## 2022-01-21 DIAGNOSIS — Z794 Long term (current) use of insulin: Secondary | ICD-10-CM

## 2022-01-21 LAB — BASIC METABOLIC PANEL
BUN: 36 mg/dL — ABNORMAL HIGH (ref 6–23)
CO2: 27 mEq/L (ref 19–32)
Calcium: 9.6 mg/dL (ref 8.4–10.5)
Chloride: 105 mEq/L (ref 96–112)
Creatinine, Ser: 1.28 mg/dL (ref 0.40–1.50)
GFR: 47.8 mL/min — ABNORMAL LOW (ref >60.00)
Glucose, Bld: 125 mg/dL — ABNORMAL HIGH (ref 70–99)
Potassium: 4.2 mEq/L (ref 3.5–5.1)
Sodium: 139 mEq/L (ref 135–145)

## 2022-01-21 LAB — CBC WITH DIFFERENTIAL/PLATELET
Basophils Absolute: 0.1 10*3/uL (ref 0.0–0.1)
Basophils Relative: 1 % (ref 0.0–3.0)
Eosinophils Absolute: 0.4 10*3/uL (ref 0.0–0.7)
Eosinophils Relative: 7.1 % — ABNORMAL HIGH (ref 0.0–5.0)
HCT: 37.7 % — ABNORMAL LOW (ref 39.0–52.0)
Hemoglobin: 12.3 g/dL — ABNORMAL LOW (ref 13.0–17.0)
Lymphocytes Relative: 33 % (ref 12.0–46.0)
Lymphs Abs: 1.9 10*3/uL (ref 0.7–4.0)
MCHC: 32.6 g/dL (ref 30.0–36.0)
MCV: 90.8 fl (ref 78.0–100.0)
Monocytes Absolute: 0.4 10*3/uL (ref 0.1–1.0)
Monocytes Relative: 7.3 % (ref 3.0–12.0)
Neutro Abs: 3 10*3/uL (ref 1.4–7.7)
Neutrophils Relative %: 51.6 % (ref 43.0–77.0)
Platelets: 181 10*3/uL (ref 150.0–400.0)
RBC: 4.16 Mil/uL — ABNORMAL LOW (ref 4.22–5.81)
RDW: 15.9 % — ABNORMAL HIGH (ref 11.5–15.5)
WBC: 5.9 10*3/uL (ref 4.0–10.5)

## 2022-01-21 LAB — FOLATE: Folate: 20.4 ng/mL (ref >5.9)

## 2022-01-21 LAB — HEMOGLOBIN A1C: Hgb A1c MFr Bld: 8.6 % — ABNORMAL HIGH (ref 4.6–6.5)

## 2022-01-21 MED ORDER — INSULIN PEN NEEDLE 32G X 6 MM MISC: 1.0000 | Freq: Every day | 1 refills | Status: DC

## 2022-01-21 MED ORDER — FLUCONAZOLE 100 MG PO TABS: 100.0000 mg | ORAL_TABLET | Freq: Every day | ORAL | 0 refills | Status: AC

## 2022-01-21 MED ORDER — TOUJEO MAX SOLOSTAR 300 UNIT/ML ~~LOC~~ SOPN: 20.0000 [IU] | PEN_INJECTOR | Freq: Every day | SUBCUTANEOUS | 1 refills | Status: DC

## 2022-01-21 NOTE — Progress Notes (Signed)
? ?Subjective:  ?Patient ID: Andre Mills, male    DOB: [birth year]  Age: 88 y.o. MRN: [id number] ? ?CC: Diabetes ? ?This visit occurred during the SARS-CoV-2 public health emergency.  Safety protocols were in place, including screening questions prior to the visit, additional usage of staff PPE, and extensive cleaning of exam room while observing appropriate contact time as indicated for disinfecting solutions.   ? ?HPI ?Andre Mills presents for f/up -  ? ?He complains of redness, pain, and discharge over the tip of his penis. ? ?Outpatient Medications Prior to Visit  ?Medication Sig Dispense Refill  ? Blood Glucose Monitoring Suppl ([place]) w/Device KIT Use to check blood sugar once a day. E11.9 1 kit 0  ? gabapentin (NEURONTIN) 300 MG capsule Take 300 mg by mouth 2 (two) times daily.    ? glucose blood (ONETOUCH VERIO) test strip Use as instructed to check blood sugar once a day. E11.9 100 each 5  ? ketoconazole (NIZORAL) 2 % cream Apply 1 application topically 2 (two) times daily. 60 g 2  ? Lancets (ONETOUCH DELICA PLUS [id number]) MISC 1 Act by Does not apply route daily. E11.9 100 each 5  ? metFORMIN (GLUCOPHAGE) 500 MG tablet Take 1 tablet (500 mg total) by mouth 2 (two) times daily with a meal. 180 tablet 3  ? omeprazole (PRILOSEC) 20 MG capsule Take 20 mg by mouth daily.    ? pioglitazone (ACTOS) 30 MG tablet Take 30 mg by mouth daily.    ? rosuvastatin (CRESTOR) 20 MG tablet Take 20 mg by mouth daily.    ? tamsulosin (FLOMAX) 0.4 MG CAPS capsule Take 0.4 mg by mouth daily.    ? timolol (BLOCADREN) 5 MG tablet Take 5 mg by mouth 2 (two) times daily.    ? dapagliflozin propanediol (FARXIGA) 10 MG TABS tablet Take 1 tablet (10 mg total) by mouth daily before breakfast. 90 tablet 1  ? dronabinol (MARINOL) 2.5 MG capsule Take 1 capsule (2.5 mg total) by mouth 2 (two) times daily before lunch and supper. 180 capsule 1  ? PRESCRIPTION MEDICATION Place 1 drop into both eyes 2 (two) times  daily. Eye drops for glaucoma    ? solifenacin (VESICARE) 5 MG tablet Take 1 tablet (5 mg total) by mouth daily. 90 tablet 1  ? simvastatin (ZOCOR) 40 MG tablet Take 40 mg by mouth daily. (Patient not taking: Reported on 01/21/2022)    ? ?No facility-administered medications prior to visit.  ? ? ?ROS ?Review of Systems  ?Constitutional:  Negative for diaphoresis, fatigue and unexpected weight change.  ?HENT: Negative.    ?Respiratory:  Negative for cough, chest tightness, shortness of breath and wheezing.   ?Cardiovascular:  Negative for chest pain, palpitations and leg swelling.  ?Gastrointestinal:  Negative for abdominal pain, diarrhea, nausea and vomiting.  ?Endocrine: Positive for polyuria.  ?Genitourinary:  Positive for frequency. Negative for difficulty urinating, dysuria, hematuria, penile discharge, scrotal swelling, testicular pain and urgency.  ?Musculoskeletal: Negative.  Negative for arthralgias and myalgias.  ?Skin: Negative.  Negative for color change.  ?Neurological: Negative.  Negative for dizziness and light-headedness.  ?Hematological: Negative.   ?Psychiatric/Behavioral: Negative.    ? ?Objective:  ?BP 132/70 (BP Location: Left Arm, Patient Position: Sitting, Cuff Size: Large)   Pulse 66   Temp 98.7 ?F (37.1 ?C) (Oral)   Resp 16   Ht 5' 7"  (1.702 m)   Wt 142 lb 8 oz (64.6 kg)   SpO2 98%  BMI 22.32 kg/m?  ? ?BP Readings from Last 3 Encounters:  ?01/21/22 132/70  ?09/17/21 116/64  ?05/28/21 120/64  ? ? ?Wt Readings from Last 3 Encounters:  ?01/21/22 142 lb 8 oz (64.6 kg)  ?09/17/21 144 lb (65.3 kg)  ?05/28/21 142 lb (64.4 kg)  ? ? ?Physical Exam ?Vitals reviewed.  ?HENT:  ?   Nose: Nose normal.  ?   Mouth/Throat:  ?   Mouth: Mucous membranes are moist.  ?Eyes:  ?   General: No scleral icterus. ?   Conjunctiva/sclera: Conjunctivae normal.  ?Cardiovascular:  ?   Rate and Rhythm: Normal rate and regular rhythm.  ?   Heart sounds: No murmur heard. ?Pulmonary:  ?   Effort: Pulmonary effort is  normal.  ?   Breath sounds: No stridor. No wheezing, rhonchi or rales.  ?Abdominal:  ?   General: Abdomen is flat.  ?   Palpations: There is no mass.  ?   Tenderness: There is no abdominal tenderness. There is no guarding.  ?Genitourinary: ?   Pubic Area: Rash present.  ?   Penis: Uncircumcised. Erythema and discharge present. No phimosis, paraphimosis, hypospadias, tenderness, swelling or lesions.   ?   Comments: Erythema over the prepuce and scant clear exudate over the glans ?Musculoskeletal:     ?   General: Normal range of motion.  ?   Cervical back: Neck supple.  ?   Right lower leg: No edema.  ?   Left lower leg: No edema.  ?Lymphadenopathy:  ?   Cervical: No cervical adenopathy.  ?Skin: ?   General: Skin is warm.  ?Neurological:  ?   General: No focal deficit present.  ?   Mental Status: He is alert.  ?Psychiatric:     ?   Mood and Affect: Mood normal.  ? ? ?Lab Results  ?Component Value Date  ? WBC 5.9 01/21/2022  ? HGB 12.3 (L) 01/21/2022  ? HCT 37.7 (L) 01/21/2022  ? PLT 181.0 01/21/2022  ? GLUCOSE 125 (H) 01/21/2022  ? CHOL  05/15/2009  ?  125        ?ATP III CLASSIFICATION: ? <200     mg/dL   Desirable ? 200-239  mg/dL   Borderline High ? >=240    mg/dL   High ?        ? TRIG 188 (H) 05/15/2009  ? HDL 30 (L) 05/15/2009  ? [place]  05/15/2009  ?  57        ?Total Cholesterol/HDL:CHD Risk ?Coronary Heart Disease Risk Table ?                    Men   Women ? 1/2 Average Risk   3.4   3.3 ? Average Risk       5.0   4.4 ? 2 X Average Risk   9.6   7.1 ? 3 X Average Risk  23.4   11.0 ?       ?Use the calculated Patient Ratio ?above and the CHD Risk Table ?to determine the patient's CHD Risk. ?       ?ATP III CLASSIFICATION (LDL): ? <100     mg/dL   Optimal ? 100-129  mg/dL   Near or Above ?                   Optimal ? 130-159  mg/dL   Borderline ? 160-189  mg/dL   High ? >190     mg/dL  Very High  ? ALT 9 03/20/2021  ? AST 12 03/20/2021  ? NA 139 01/21/2022  ? K 4.2 01/21/2022  ? CL 105 01/21/2022  ?  CREATININE 1.28 01/21/2022  ? BUN 36 (H) 01/21/2022  ? CO2 27 01/21/2022  ? TSH 3.61 03/20/2021  ? INR 1.1 05/14/2009  ? HGBA1C 8.6 (H) 01/21/2022  ? MICROALBUR <0.7 09/17/2021  ? ? ?DG Ribs Bilateral W/Chest ? ?Result Date: 11/11/2016 ?CLINICAL DATA:  [place] several days ago with anterior chest pain particularly right-sided, former smoking history EXAM: BILATERAL RIBS AND CHEST - 4+ VIEW COMPARISON:  Chest x-ray of 05/14/2009 FINDINGS: No active infiltrate or effusion is seen. No pneumothorax is noted. Mediastinal and hilar contours are unremarkable. The heart is minimally prominent. Bilateral rib detail film were obtained. There is very slight irregularity of the very anterior right seventh rib which could represent a nondisplaced fracture. On the left there appears to be an old fracture involving the left seventh rib anteriorly and laterally with thickening of the cortex. No acute left rib fracture is seen. IMPRESSION: 1. Possible nondisplaced fracture of the anterior right seventh rib. 2. Probable old fracture of the left anterolateral seventh rib. 3. No active lung disease. Electronically Signed   By: [name] M.D.   On: 11/11/2016 11:43  ? ? ?Assessment & Plan:  ? ?Alecxis was seen today for diabetes. ? ?Diagnoses and all orders for this visit: ? ?Primary hypertension- His blood pressure is adequately well controlled. ?-     Basic metabolic panel; Future ?-     Basic metabolic panel ? ?Stage 3b chronic kidney disease ([place])- His renal function is stable. ?-     Basic metabolic panel; Future ?-     Basic metabolic panel ? ?Candidal balanitis- Will discontinue the SGLT2 inhibitor. ?-     fluconazole (DIFLUCAN) 100 MG tablet; Take 1 tablet (100 mg total) by mouth daily for 7 days. ? ?Vitamin B12 deficiency anemia due to intrinsic factor deficiency ?-     CBC with Differential/Platelet; Future ?-     Folate; Future ?-     Folate ?-     CBC with Differential/Platelet ? ?Type 2 diabetes mellitus with stage 3b chronic  kidney disease, without long-term current use of insulin ([place]) ?-     Hemoglobin A1c; Future ?-     Hemoglobin A1c ? ?Insulin-requiring or dependent type II diabetes mellitus ([place])- I recommended that he start using basal insu

## 2022-01-21 NOTE — Patient Instructions (Signed)
Type 2 Diabetes Mellitus, Diagnosis, Adult ?Type 2 diabetes (type 2 diabetes mellitus) is a long-term, or chronic, disease. In type 2 diabetes, one or both of these problems may be present: ?The pancreas does not make enough of a hormone called insulin. ?Cells in the body do not respond properly to the insulin that the body makes (insulin resistance). ?Normally, insulin allows blood sugar (glucose) to enter cells in the body. The cells use glucose for energy. Insulin resistance or lack of insulin causes excess glucose to build up in the blood instead of going into cells. This causes high blood glucose (hyperglycemia).  ?What are the causes? ?The exact cause of type 2 diabetes is not known. ?What increases the risk? ?The following factors may make you more likely to develop this condition: ?Having a family member with type 2 diabetes. ?Being overweight or obese. ?Being inactive (sedentary). ?Having been diagnosed with insulin resistance. ?Having a history of prediabetes, diabetes when you were pregnant (gestational diabetes), or polycystic ovary syndrome (PCOS). ?What are the signs or symptoms? ?In the early stage of this condition, you may not have symptoms. Symptoms develop slowly and may include: ?Increased thirst or hunger. ?Increased urination. ?Unexplained weight loss. ?Tiredness (fatigue) or weakness. ?Vision changes, such as blurry vision. ?Dark patches on the skin. ?How is this diagnosed? ?This condition is diagnosed based on your symptoms, your medical history, a physical exam, and your blood glucose level. Your blood glucose may be checked with one or more of the following blood tests: ?A fasting blood glucose (FBG) test. You will not be allowed to eat (you will fast) for 8 hours or longer before a blood sample is taken. ?A random blood glucose test. This test checks blood glucose at any time of day regardless of when you ate. ?An A1C (hemoglobin A1C) blood test. This test provides information about blood  glucose levels over the previous 2-3 months. ?An oral glucose tolerance test (OGTT). This test measures your blood glucose at two times: ?After fasting. This is your baseline blood glucose level. ?Two hours after drinking a beverage that contains glucose. ?You may be diagnosed with type 2 diabetes if: ?Your fasting blood glucose level is 126 mg/dL (7.0 mmol/L) or higher. ?Your random blood glucose level is 200 mg/dL (11.1 mmol/L) or higher. ?Your A1C level is 6.5% or higher. ?Your oral glucose tolerance test result is higher than 200 mg/dL (11.1 mmol/L). ?These blood tests may be repeated to confirm your diagnosis. ?How is this treated? ?Your treatment may be managed by a specialist called an endocrinologist. Type 2 diabetes may be treated by following instructions from your health care provider about: ?Making dietary and lifestyle changes. These may include: ?Following a personalized nutrition plan that is developed by a registered dietitian. ?Exercising regularly. ?Finding ways to manage stress. ?Checking your blood glucose level as often as told. ?Taking diabetes medicines or insulin daily. This helps to keep your blood glucose levels in the healthy range. ?Taking medicines to help prevent complications from diabetes. Medicines may include: ?Aspirin. ?Medicine to lower cholesterol. ?Medicine to control blood pressure. ?Your health care provider will set treatment goals for you. Your goals will be based on your age, other medical conditions you have, and how you respond to diabetes treatment. Generally, the goal of treatment is to maintain the following blood glucose levels: ?Before meals: 80-130 mg/dL (4.4-7.2 mmol/L). ?After meals: below 180 mg/dL (10 mmol/L). ?A1C level: less than 7%. ?Follow these instructions at home: ?Questions to ask your health care provider ?  Consider asking the following questions: ?Should I meet with a certified diabetes care and education specialist? ?What diabetes medicines do I need,  and when should I take them? ?What equipment will I need to manage my diabetes at home? ?How often do I need to check my blood glucose? ?Where can I find a support group for people with diabetes? ?What number can I call if I have questions? ?When is my next appointment? ?General instructions ?Take over-the-counter and prescription medicines only as told by your health care provider. ?Keep all follow-up visits. This is important. ?Where to find more information ?For help and guidance and for more information about diabetes, please visit: ?American Diabetes Association (ADA): www.diabetes.org ?American Association of Diabetes Care and Education Specialists (ADCES): www.diabeteseducator.org ?International Diabetes Federation (IDF): www.idf.org ?Contact a health care provider if: ?Your blood glucose is at or above 240 mg/dL (13.3 mmol/L) for 2 days in a row. ?You have been sick or have had a fever for 2 days or longer, and you are not getting better. ?You have any of the following problems for more than 6 hours: ?You cannot eat or drink. ?You have nausea and vomiting. ?You have diarrhea. ?Get help right away if: ?You have severe hypoglycemia. This means your blood glucose is lower than 54 mg/dL (3.0 mmol/L). ?You become confused or you have trouble thinking clearly. ?You have difficulty breathing. ?You have moderate or large ketone levels in your urine. ?These symptoms may represent a serious problem that is an emergency. Do not wait to see if the symptoms will go away. Get medical help right away. Call your local emergency services (911 in the [place] Do not drive yourself to the hospital. ?Summary ?Type 2 diabetes mellitus is a long-term, or chronic, disease. In type 2 diabetes, the pancreas does not make enough of a hormone called insulin, or cells in the body do not respond properly to insulin that the body makes. ?This condition is treated by making dietary and lifestyle changes and taking diabetes medicines or  insulin. ?Your health care provider will set treatment goals for you. Your goals will be based on your age, other medical conditions you have, and how you respond to diabetes treatment. ?Keep all follow-up visits. This is important. ?This information is not intended to replace advice given to you by your health care provider. Make sure you discuss any questions you have with your health care provider. ?Document Revised: 01/20/2021 Document Reviewed: 01/20/2021 ?Elsevier Patient Education ? 2022 [employer] ? ?

## 2022-01-23 ENCOUNTER — Encounter: Payer: Self-pay | Admitting: Internal Medicine

## 2022-01-28 ENCOUNTER — Other Ambulatory Visit: Payer: Self-pay | Admitting: Internal Medicine

## 2022-01-28 DIAGNOSIS — E119 Type 2 diabetes mellitus without complications: Secondary | ICD-10-CM

## 2022-01-28 MED ORDER — SEMGLEE 100 UNIT/ML ~~LOC~~ SOPN: 20.0000 [IU] | PEN_INJECTOR | Freq: Every day | SUBCUTANEOUS | 1 refills | Status: DC

## 2022-01-29 ENCOUNTER — Other Ambulatory Visit: Payer: Self-pay | Admitting: Internal Medicine

## 2022-01-29 DIAGNOSIS — E119 Type 2 diabetes mellitus without complications: Secondary | ICD-10-CM

## 2022-01-29 MED ORDER — TOUJEO MAX SOLOSTAR 300 UNIT/ML ~~LOC~~ SOPN: 20.0000 [IU] | PEN_INJECTOR | Freq: Every day | SUBCUTANEOUS | 1 refills | Status: DC

## 2022-01-29 MED ORDER — INSULIN PEN NEEDLE 32G X 6 MM MISC: 1.0000 | Freq: Every day | 1 refills | Status: AC

## 2022-02-09 ENCOUNTER — Ambulatory Visit: Payer: Medicare Other | Admitting: Internal Medicine

## 2022-02-10 ENCOUNTER — Other Ambulatory Visit: Payer: Self-pay | Admitting: Internal Medicine

## 2022-02-10 DIAGNOSIS — E119 Type 2 diabetes mellitus without complications: Secondary | ICD-10-CM

## 2022-02-10 MED ORDER — BASAGLAR KWIKPEN 100 UNIT/ML ~~LOC~~ SOPN: 20.0000 [IU] | PEN_INJECTOR | Freq: Every day | SUBCUTANEOUS | 1 refills | Status: AC

## 2022-02-16 ENCOUNTER — Ambulatory Visit (INDEPENDENT_AMBULATORY_CARE_PROVIDER_SITE_OTHER): Payer: Medicare Other

## 2022-02-16 DIAGNOSIS — D510 Vitamin B12 deficiency anemia due to intrinsic factor deficiency: Secondary | ICD-10-CM | POA: Diagnosis not present

## 2022-02-16 MED ORDER — CYANOCOBALAMIN 1000 MCG/ML IJ SOLN
1000.0000 ug | Freq: Once | INTRAMUSCULAR | Status: AC
  Administered 2022-02-16: 1000 ug via INTRAMUSCULAR

## 2022-02-16 NOTE — Progress Notes (Addendum)
Pt here for monthly B12 injection per Dr. Ronnald Ramp ? ?B12 1069mg given IM, and pt tolerated injection well. ? ?Next B12 injection scheduled for his OV on 03/18/22 ?

## 2022-03-18 ENCOUNTER — Ambulatory Visit (INDEPENDENT_AMBULATORY_CARE_PROVIDER_SITE_OTHER): Payer: Medicare Other | Admitting: Internal Medicine

## 2022-03-18 VITALS — BP 134/76 | HR 69 | Temp 98.1°F | Ht 67.0 in | Wt 143.0 lb

## 2022-03-18 DIAGNOSIS — E1122 Type 2 diabetes mellitus with diabetic chronic kidney disease: Secondary | ICD-10-CM

## 2022-03-18 DIAGNOSIS — D510 Vitamin B12 deficiency anemia due to intrinsic factor deficiency: Secondary | ICD-10-CM | POA: Diagnosis not present

## 2022-03-18 DIAGNOSIS — N1832 Chronic kidney disease, stage 3b: Secondary | ICD-10-CM | POA: Diagnosis not present

## 2022-03-18 DIAGNOSIS — Z23 Encounter for immunization: Secondary | ICD-10-CM

## 2022-03-18 DIAGNOSIS — I10 Essential (primary) hypertension: Secondary | ICD-10-CM | POA: Diagnosis not present

## 2022-03-18 MED ORDER — SHINGRIX 50 MCG/0.5ML IM SUSR: 0.5000 mL | Freq: Once | INTRAMUSCULAR | 1 refills | Status: AC

## 2022-03-18 MED ORDER — CYANOCOBALAMIN 1000 MCG/ML IJ SOLN
1000.0000 ug | Freq: Once | INTRAMUSCULAR | Status: AC
  Administered 2022-03-18: 1000 ug via INTRAMUSCULAR

## 2022-03-18 NOTE — Progress Notes (Signed)
? ?Subjective:  ?Patient ID: Andre Mills, male    DOB: November 02, 1925  Age: 88 y.o. MRN: [id number] ? ?CC: Diabetes ? ? ?HPI ?Andre Mills presents for f/up - ? ?He stopped taking [place] and tells me his urinary symptoms have resolved.  He denies chest pain, shortness of breath, edema, or polys. ? ?Outpatient Medications Prior to Visit  ?Medication Sig Dispense Refill  ? Blood Glucose Monitoring Suppl ([place]) w/Device KIT Use to check blood sugar once a day. E11.9 1 kit 0  ? gabapentin (NEURONTIN) 300 MG capsule Take 300 mg by mouth 2 (two) times daily.    ? glucose blood (ONETOUCH VERIO) test strip Use as instructed to check blood sugar once a day. E11.9 100 each 5  ? Insulin Glargine (BASAGLAR KWIKPEN) 100 UNIT/ML Inject 20 Units into the skin daily. 18 mL 1  ? Insulin Pen Needle 32G X 6 MM MISC 1 Act by Does not apply route daily. 100 each 1  ? ketoconazole (NIZORAL) 2 % cream Apply 1 application topically 2 (two) times daily. 60 g 2  ? Lancets (ONETOUCH DELICA PLUS [id number]) MISC 1 Act by Does not apply route daily. E11.9 100 each 5  ? metFORMIN (GLUCOPHAGE) 500 MG tablet Take 1 tablet (500 mg total) by mouth 2 (two) times daily with a meal. 180 tablet 3  ? omeprazole (PRILOSEC) 20 MG capsule Take 20 mg by mouth daily.    ? rosuvastatin (CRESTOR) 20 MG tablet Take 20 mg by mouth daily.    ? tamsulosin (FLOMAX) 0.4 MG CAPS capsule Take 0.4 mg by mouth daily.    ? timolol (BLOCADREN) 5 MG tablet Take 5 mg by mouth 2 (two) times daily.    ? pioglitazone (ACTOS) 30 MG tablet Take 30 mg by mouth daily.    ? ?No facility-administered medications prior to visit.  ? ? ?ROS ?Review of Systems  ?Constitutional:  Negative for diaphoresis and fatigue.  ?HENT: Negative.  Negative for sore throat.   ?Eyes: Negative.   ?Respiratory: Negative.  Negative for cough, chest tightness, shortness of breath and wheezing.   ?Cardiovascular:  Negative for chest pain, palpitations and leg swelling.   ?Gastrointestinal:  Negative for abdominal pain, constipation, diarrhea, nausea and vomiting.  ?Genitourinary:  Negative for difficulty urinating, dysuria, frequency, penile discharge and penile pain.  ?Musculoskeletal:  Positive for arthralgias. Negative for myalgias.  ?Skin: Negative.   ?Neurological:  Negative for dizziness, weakness and light-headedness.  ?Hematological:  Negative for adenopathy. Does not bruise/bleed easily.  ?Psychiatric/Behavioral: Negative.    ? ?Objective:  ?BP 134/76 (BP Location: Left Arm, Patient Position: Sitting, Cuff Size: Large)   Pulse 69   Temp 98.1 ?F (36.7 ?C) (Oral)   Ht 5' 7" (1.702 m)   Wt 143 lb (64.9 kg)   SpO2 94%   BMI 22.40 kg/m?  ? ?BP Readings from Last 3 Encounters:  ?03/18/22 134/76  ?01/21/22 132/70  ?09/17/21 116/64  ? ? ?Wt Readings from Last 3 Encounters:  ?03/18/22 143 lb (64.9 kg)  ?01/21/22 142 lb 8 oz (64.6 kg)  ?09/17/21 144 lb (65.3 kg)  ? ? ?Physical Exam ?Vitals reviewed.  ?Constitutional:   ?   Appearance: He is not ill-appearing.  ?HENT:  ?   Mouth/Throat:  ?   Mouth: Mucous membranes are moist.  ?Eyes:  ?   General: No scleral icterus. ?   Conjunctiva/sclera: Conjunctivae normal.  ?Cardiovascular:  ?   Rate and Rhythm: Normal rate and regular  rhythm.  ?   Heart sounds: No murmur heard. ?Pulmonary:  ?   Effort: Pulmonary effort is normal.  ?   Breath sounds: No stridor. No wheezing, rhonchi or rales.  ?Abdominal:  ?   General: Abdomen is flat.  ?   Palpations: There is no mass.  ?   Tenderness: There is no abdominal tenderness. There is no guarding or rebound.  ?   Hernia: No hernia is present.  ?Musculoskeletal:     ?   General: Normal range of motion.  ?   Cervical back: Neck supple.  ?   Right lower leg: No edema.  ?   Left lower leg: No edema.  ?Lymphadenopathy:  ?   Cervical: No cervical adenopathy.  ?Skin: ?   General: Skin is warm and dry.  ?   Findings: No rash.  ?Neurological:  ?   Mental Status: He is alert. Mental status is at baseline.   ? ? ?Lab Results  ?Component Value Date  ? WBC 5.9 01/21/2022  ? HGB 12.3 (L) 01/21/2022  ? HCT 37.7 (L) 01/21/2022  ? PLT 181.0 01/21/2022  ? GLUCOSE 125 (H) 01/21/2022  ? CHOL  05/15/2009  ?  125        ?ATP III CLASSIFICATION: ? <200     mg/dL   Desirable ? 200-239  mg/dL   Borderline High ? >=240    mg/dL   High ?        ? TRIG 188 (H) 05/15/2009  ? HDL 30 (L) 05/15/2009  ? [place]  05/15/2009  ?  57        ?Total Cholesterol/HDL:CHD Risk ?Coronary Heart Disease Risk Table ?                    Men   Women ? 1/2 Average Risk   3.4   3.3 ? Average Risk       5.0   4.4 ? 2 X Average Risk   9.6   7.1 ? 3 X Average Risk  23.4   11.0 ?       ?Use the calculated Patient Ratio ?above and the CHD Risk Table ?to determine the patient's CHD Risk. ?       ?ATP III CLASSIFICATION (LDL): ? <100     mg/dL   Optimal ? 100-129  mg/dL   Near or Above ?                   Optimal ? 130-159  mg/dL   Borderline ? 160-189  mg/dL   High ? >190     mg/dL   Very High  ? ALT 9 03/20/2021  ? AST 12 03/20/2021  ? NA 139 01/21/2022  ? K 4.2 01/21/2022  ? CL 105 01/21/2022  ? CREATININE 1.28 01/21/2022  ? BUN 36 (H) 01/21/2022  ? CO2 27 01/21/2022  ? TSH 3.61 03/20/2021  ? INR 1.1 05/14/2009  ? HGBA1C 8.6 (H) 01/21/2022  ? MICROALBUR <0.7 09/17/2021  ? ? ?DG Ribs Bilateral W/Chest ? ?Result Date: 11/11/2016 ?CLINICAL DATA:  [place] several days ago with anterior chest pain particularly right-sided, former smoking history EXAM: BILATERAL RIBS AND CHEST - 4+ VIEW COMPARISON:  Chest x-ray of 05/14/2009 FINDINGS: No active infiltrate or effusion is seen. No pneumothorax is noted. Mediastinal and hilar contours are unremarkable. The heart is minimally prominent. Bilateral rib detail film were obtained. There is very slight irregularity of the very anterior right seventh rib which  could represent a nondisplaced fracture. On the left there appears to be an old fracture involving the left seventh rib anteriorly and laterally with thickening of the  cortex. No acute left rib fracture is seen. IMPRESSION: 1. Possible nondisplaced fracture of the anterior right seventh rib. 2. Probable old fracture of the left anterolateral seventh rib. 3. No active lung disease. Electronically Signed   By: [name] M.D.   On: 11/11/2016 11:43  ? ? ?Assessment & Plan:  ? ?Andre Mills was seen today for diabetes. ? ?Diagnoses and all orders for this visit: ? ?Vitamin B12 deficiency anemia due to intrinsic factor deficiency ?-     cyanocobalamin ((VITAMIN B-12)) injection 1,000 mcg ? ?Need for prophylactic vaccination and inoculation against varicella ?-     Zoster Vaccine Adjuvanted [facility]) injection; Inject 0.5 mLs into the muscle once for 1 dose. ? ?Primary hypertension- His blood pressure is adequately well controlled. ? ?Stage 3b chronic kidney disease ([place])- He is avoiding nephrotoxic agents. ? ?Type 2 diabetes mellitus with stage 3b chronic kidney disease, without long-term current use of insulin ([place])- His blood sugar is adequately well controlled. ? ? ?I have discontinued Andre Mills's pioglitazone. I am also having him start on Shingrix. Additionally, I am having him maintain his omeprazole, gabapentin, timolol, tamsulosin, OneTouch Delica Plus [id number], OneTouch Verio, OneTouch Verio Flex System, metFORMIN, ketoconazole, rosuvastatin, Insulin Pen Needle, and Basaglar KwikPen. We administered cyanocobalamin. ? ?Meds ordered this encounter  ?Medications  ? cyanocobalamin ((VITAMIN B-12)) injection 1,000 mcg  ? Zoster Vaccine Adjuvanted [facility]) injection  ?  Sig: Inject 0.5 mLs into the muscle once for 1 dose.  ?  Dispense:  0.5 mL  ?  Refill:  1  ? ? ? ?Follow-up: No follow-ups on file. ? ?Scarlette Calico, MD ?

## 2022-03-19 ENCOUNTER — Encounter: Payer: Self-pay | Admitting: Internal Medicine

## 2022-04-15 DIAGNOSIS — H401133 Primary open-angle glaucoma, bilateral, severe stage: Secondary | ICD-10-CM | POA: Diagnosis not present

## 2022-04-15 DIAGNOSIS — H35033 Hypertensive retinopathy, bilateral: Secondary | ICD-10-CM | POA: Diagnosis not present

## 2022-04-15 DIAGNOSIS — Z961 Presence of intraocular lens: Secondary | ICD-10-CM | POA: Diagnosis not present

## 2022-04-15 DIAGNOSIS — E113313 Type 2 diabetes mellitus with moderate nonproliferative diabetic retinopathy with macular edema, bilateral: Secondary | ICD-10-CM | POA: Diagnosis not present

## 2022-04-20 ENCOUNTER — Ambulatory Visit (INDEPENDENT_AMBULATORY_CARE_PROVIDER_SITE_OTHER): Payer: Medicare Other

## 2022-04-20 DIAGNOSIS — E538 Deficiency of other specified B group vitamins: Secondary | ICD-10-CM

## 2022-04-20 MED ORDER — CYANOCOBALAMIN 1000 MCG/ML IJ SOLN
1000.0000 ug | Freq: Once | INTRAMUSCULAR | Status: AC
  Administered 2022-04-20: 1000 ug via INTRAMUSCULAR

## 2022-04-20 NOTE — Progress Notes (Signed)
B12 given and tolerated well °

## 2022-05-22 ENCOUNTER — Ambulatory Visit (INDEPENDENT_AMBULATORY_CARE_PROVIDER_SITE_OTHER): Payer: Medicare Other

## 2022-05-22 DIAGNOSIS — E538 Deficiency of other specified B group vitamins: Secondary | ICD-10-CM | POA: Diagnosis not present

## 2022-05-22 MED ORDER — CYANOCOBALAMIN 1000 MCG/ML IJ SOLN
1000.0000 ug | INTRAMUSCULAR | Status: AC
  Administered 2022-05-22 – 2024-07-12 (×6): 1000 ug via INTRAMUSCULAR

## 2022-05-22 NOTE — Progress Notes (Signed)
B12 given Please cosign 

## 2022-06-08 LAB — BASIC METABOLIC PANEL
BUN: 28 — AB (ref 4–21)
CO2: 29 — AB (ref 13–22)
Chloride: 109 — AB (ref 99–108)
Creatinine: 0.9 (ref 0.6–1.3)
Glucose: 131
Potassium: 4.3 mEq/L (ref 3.5–5.1)
Sodium: 138 (ref 137–147)

## 2022-06-08 LAB — COMPREHENSIVE METABOLIC PANEL
Calcium: 8.4 — AB (ref 8.7–10.7)
eGFR: 77

## 2022-06-08 LAB — HEMOGLOBIN A1C: Hemoglobin A1C: 7.3

## 2022-06-24 ENCOUNTER — Encounter: Payer: Self-pay | Admitting: Internal Medicine

## 2022-06-24 ENCOUNTER — Ambulatory Visit (INDEPENDENT_AMBULATORY_CARE_PROVIDER_SITE_OTHER): Payer: Medicare Other | Admitting: Internal Medicine

## 2022-06-24 VITALS — BP 138/68 | HR 64 | Temp 98.0°F | Ht 67.0 in | Wt 143.0 lb

## 2022-06-24 DIAGNOSIS — E538 Deficiency of other specified B group vitamins: Secondary | ICD-10-CM | POA: Diagnosis not present

## 2022-06-24 DIAGNOSIS — N1832 Chronic kidney disease, stage 3b: Secondary | ICD-10-CM

## 2022-06-24 DIAGNOSIS — E1122 Type 2 diabetes mellitus with diabetic chronic kidney disease: Secondary | ICD-10-CM

## 2022-06-24 DIAGNOSIS — I10 Essential (primary) hypertension: Secondary | ICD-10-CM | POA: Diagnosis not present

## 2022-06-24 DIAGNOSIS — D510 Vitamin B12 deficiency anemia due to intrinsic factor deficiency: Secondary | ICD-10-CM

## 2022-06-24 NOTE — Patient Instructions (Signed)
                                                                    [email or web address]                       

## 2022-06-24 NOTE — Progress Notes (Signed)
Subjective:  Patient ID: Andre Mills, male    DOB: 1924/12/25  Age: 88 y.o. MRN: [id number]  CC: Anemia, Hypertension, and Diabetes   HPI Andre Mills presents for f/up-  He recently had labs done at the [place] that demonstrated good sugar control and stable renal function.  He has felt well recently and offers no complaints.  Outpatient Medications Prior to Visit  Medication Sig Dispense Refill   Blood Glucose Monitoring Suppl ([place]) w/Device KIT Use to check blood sugar once a day. E11.9 1 kit 0   gabapentin (NEURONTIN) 300 MG capsule Take 300 mg by mouth 2 (two) times daily.     glucose blood (ONETOUCH VERIO) test strip Use as instructed to check blood sugar once a day. E11.9 100 each 5   Insulin Glargine (BASAGLAR KWIKPEN) 100 UNIT/ML Inject 20 Units into the skin daily. 18 mL 1   Insulin Pen Needle 32G X 6 MM MISC 1 Act by Does not apply route daily. 100 each 1   ketoconazole (NIZORAL) 2 % cream Apply 1 application topically 2 (two) times daily. 60 g 2   Lancets (ONETOUCH DELICA PLUS [id number]) MISC 1 Act by Does not apply route daily. E11.9 100 each 5   metFORMIN (GLUCOPHAGE) 500 MG tablet Take 1 tablet (500 mg total) by mouth 2 (two) times daily with a meal. 180 tablet 3   omeprazole (PRILOSEC) 20 MG capsule Take 20 mg by mouth daily.     rosuvastatin (CRESTOR) 20 MG tablet Take 20 mg by mouth daily.     tamsulosin (FLOMAX) 0.4 MG CAPS capsule Take 0.4 mg by mouth daily.     timolol (BLOCADREN) 5 MG tablet Take 5 mg by mouth 2 (two) times daily.     Facility-Administered Medications Prior to Visit  Medication Dose Route Frequency Provider Last Rate Last Admin   cyanocobalamin ((VITAMIN B-12)) injection 1,000 mcg  1,000 mcg Intramuscular Q30 days Andre Lima, MD   1,000 mcg at 06/24/22 1007    ROS Review of Systems  Constitutional: Negative.  Negative for diaphoresis and fatigue.  HENT: Negative.    Eyes: Negative.   Respiratory:  Negative for  cough and shortness of breath.   Cardiovascular:  Negative for palpitations and leg swelling.  Gastrointestinal:  Negative for abdominal pain and diarrhea.  Endocrine: Negative.   Genitourinary:  Negative for difficulty urinating.  Musculoskeletal: Negative.   Skin: Negative.   Neurological:  Negative for dizziness and weakness.  Hematological:  Negative for adenopathy. Does not bruise/bleed easily.  Psychiatric/Behavioral: Negative.      Objective:  BP 138/68 (BP Location: Left Arm, Patient Position: Sitting, Cuff Size: Large)   Pulse 64   Temp 98 F (36.7 C) (Oral)   Ht 5' 7"  (1.702 m)   Wt 143 lb (64.9 kg)   SpO2 97%   BMI 22.40 kg/m   BP Readings from Last 3 Encounters:  06/24/22 138/68  03/18/22 134/76  01/21/22 132/70    Wt Readings from Last 3 Encounters:  06/24/22 143 lb (64.9 kg)  03/18/22 143 lb (64.9 kg)  01/21/22 142 lb 8 oz (64.6 kg)    Physical Exam Vitals reviewed.  Constitutional:      Appearance: Normal appearance.  HENT:     Mouth/Throat:     Mouth: Mucous membranes are moist.  Eyes:     General: No scleral icterus.    Conjunctiva/sclera: Conjunctivae normal.  Cardiovascular:     Rate and Rhythm: Normal  rate and regular rhythm.     Heart sounds: No murmur heard. Pulmonary:     Effort: Pulmonary effort is normal.     Breath sounds: No stridor. No wheezing, rhonchi or rales.  Abdominal:     Palpations: There is no mass.     Tenderness: There is no abdominal tenderness. There is no guarding.     Hernia: No hernia is present.  Musculoskeletal:        General: Normal range of motion.     Cervical back: Neck supple.     Right lower leg: No edema.     Left lower leg: No edema.  Lymphadenopathy:     Cervical: No cervical adenopathy.  Skin:    General: Skin is warm and dry.  Neurological:     General: No focal deficit present.     Mental Status: He is alert. Mental status is at baseline.  Psychiatric:        Mood and Affect: Mood normal.         Behavior: Behavior normal.     Lab Results  Component Value Date   WBC 5.9 01/21/2022   HGB 12.3 (L) 01/21/2022   HCT 37.7 (L) 01/21/2022   PLT 181.0 01/21/2022   GLUCOSE 125 (H) 01/21/2022   CHOL  05/15/2009    125        ATP III CLASSIFICATION:  <200     mg/dL   Desirable  200-239  mg/dL   Borderline High  >=240    mg/dL   High          TRIG 188 (H) 05/15/2009   HDL 30 (L) 05/15/2009   LDLCALC  05/15/2009    57        Total Cholesterol/HDL:CHD Risk Coronary Heart Disease Risk Table                     Men   Women  1/2 Average Risk   3.4   3.3  Average Risk       5.0   4.4  2 X Average Risk   9.6   7.1  3 X Average Risk  23.4   11.0        Use the calculated Patient Ratio above and the CHD Risk Table to determine the patient's CHD Risk.        ATP III CLASSIFICATION (LDL):  <100     mg/dL   Optimal  100-129  mg/dL   Near or Above                    Optimal  130-159  mg/dL   Borderline  160-189  mg/dL   High  >190     mg/dL   Very High   ALT 9 03/20/2021   AST 12 03/20/2021   NA 138 06/08/2022   K 4.3 06/08/2022   CL 109 (A) 06/08/2022   CREATININE 0.9 06/08/2022   BUN 28 (A) 06/08/2022   CO2 29 (A) 06/08/2022   TSH 3.61 03/20/2021   INR 1.1 05/14/2009   HGBA1C 7.3 06/08/2022   MICROALBUR <0.7 09/17/2021    DG Ribs Bilateral W/Chest  Result Date: 11/11/2016 CLINICAL DATA:  Andre Mills several days ago with anterior chest pain particularly right-sided, former smoking history EXAM: BILATERAL RIBS AND CHEST - 4+ VIEW COMPARISON:  Chest x-ray of 05/14/2009 FINDINGS: No active infiltrate or effusion is seen. No pneumothorax is noted. Mediastinal and hilar contours are unremarkable. The heart is  minimally prominent. Bilateral rib detail film were obtained. There is very slight irregularity of the very anterior right seventh rib which could represent a nondisplaced fracture. On the left there appears to be an old fracture involving the left seventh rib anteriorly and  laterally with thickening of the cortex. No acute left rib fracture is seen. IMPRESSION: 1. Possible nondisplaced fracture of the anterior right seventh rib. 2. Probable old fracture of the left anterolateral seventh rib. 3. No active lung disease. Electronically Signed   By: [name] M.D.   On: 11/11/2016 11:43    Assessment & Plan:   Andre Mills was seen today for anemia, hypertension and diabetes.  Diagnoses and all orders for this visit:  Stage 3b chronic kidney disease ([place])- His renal function is stable. -     CBC with Differential/Platelet; Future  Primary hypertension- His blood pressure is adequately well controlled. -     CBC with Differential/Platelet; Future  Type 2 diabetes mellitus with stage 3b chronic kidney disease, without long-term current use of insulin ([place])- His blood sugar is well controlled.  Vitamin B12 deficiency anemia due to intrinsic factor deficiency- Will continue parenteral B12 replacement therapy. -     CBC with Differential/Platelet; Future -     Folate; Future   I am having Andre Mills maintain his omeprazole, gabapentin, timolol, tamsulosin, OneTouch Delica Plus [id number], OneTouch Verio, OneTouch Verio Flex System, metFORMIN, ketoconazole, rosuvastatin, Insulin Pen Needle, and Basaglar KwikPen. We administered cyanocobalamin. We will continue to administer cyanocobalamin.  No orders of the defined types were placed in this encounter.    Follow-up: Return in about 6 months (around 12/25/2022).  Scarlette Calico, MD

## 2022-07-21 ENCOUNTER — Ambulatory Visit (INDEPENDENT_AMBULATORY_CARE_PROVIDER_SITE_OTHER): Payer: Medicare Other

## 2022-07-21 VITALS — BP 118/60 | HR 67 | Temp 97.7°F | Ht 67.0 in | Wt 146.4 lb

## 2022-07-21 DIAGNOSIS — Z23 Encounter for immunization: Secondary | ICD-10-CM | POA: Diagnosis not present

## 2022-07-21 DIAGNOSIS — Z0000 Encounter for general adult medical examination without abnormal findings: Secondary | ICD-10-CM

## 2022-07-21 NOTE — Progress Notes (Signed)
Subjective:   Andre Mills is a 88 y.o. male who presents for Medicare Annual/Subsequent preventive examination.  Review of Systems     Cardiac Risk Factors include: advanced age (>3mn, >>76women);diabetes mellitus;dyslipidemia;male gender     Objective:    Today's Vitals   07/21/22 0902  BP: 118/60  Pulse: 67  Temp: 97.7 F (36.5 C)  TempSrc: Temporal  SpO2: 99%  Weight: 146 lb 6.4 oz (66.4 kg)  Height: 5' 7"  (1.702 m)  PainSc: 0-No pain   Body mass index is 22.93 kg/m.     07/21/2022   10:02 AM 07/19/2021   10:48 AM 06/22/2014    9:35 AM  Advanced Directives  Does Patient Have a Medical Advance Directive? Yes Yes Yes  Type of [place]amed[place]of [place] will [place]Living will [place] will  Does patient want to make changes to medical advance directive?  No - Patient declined No - Patient declined  Copy of [place] Chart? No - copy requested No - copy requested No - copy requested    Current Medications (verified) Outpatient Encounter Medications as of 07/21/2022  Medication Sig   Blood Glucose Monitoring Suppl ([place] w/Device KIT Use to check blood sugar once a day. E11.9   gabapentin (NEURONTIN) 300 MG capsule Take 300 mg by mouth 2 (two) times daily.   glucose blood (ONETOUCH VERIO) test strip Use as instructed to check blood sugar once a day. E11.9   Insulin Glargine (BASAGLAR KWIKPEN) 100 UNIT/ML Inject 20 Units into the skin daily.   Insulin Pen Needle 32G X 6 MM MISC 1 Act by Does not apply route daily.   ketoconazole (NIZORAL) 2 % cream Apply 1 application topically 2 (two) times daily.   Lancets (ONETOUCH DELICA PLUS [id number] MISC 1 Act by Does not apply route daily. E11.9   metFORMIN (GLUCOPHAGE) 500 MG tablet Take 1 tablet (500 mg total) by mouth 2 (two) times daily with a meal.   omeprazole (PRILOSEC) 20 MG capsule Take 20 mg by  mouth daily.   rosuvastatin (CRESTOR) 20 MG tablet Take 20 mg by mouth daily.   tamsulosin (FLOMAX) 0.4 MG CAPS capsule Take 0.4 mg by mouth daily.   timolol (BLOCADREN) 5 MG tablet Take 5 mg by mouth 2 (two) times daily.   Facility-Administered Encounter Medications as of 07/21/2022  Medication   cyanocobalamin ((VITAMIN B-12)) injection 1,000 mcg    Allergies (verified) Flexeril [cyclobenzaprine], Limbitrol ds [chlordiazepoxide-amitriptyline], Macrodantin [nitrofurantoin macrocrystal], Mobic [meloxicam], and Septra [sulfamethoxazole-trimethoprim]   History: Past Medical History:  Diagnosis Date   Anemia    Arthritis    Diabetes mellitus without complication (HCC)    GERD (gastroesophageal reflux disease)    High cholesterol    Hypertension    Wears dentures    TOP   Wears glasses    Past Surgical History:  Procedure Laterality Date   COLONOSCOPY     COLONOSCOPY W/ BIOPSIES AND POLYPECTOMY     EYE SURGERY     both cataracts   KNEE ARTHROSCOPY     left   TONSILLECTOMY     TRIGGER FINGER RELEASE Right 06/28/2014   Procedure: RELEASE A-1 PULLEY POSSIBLE EXCISION ONE SLIP SUPERFICIALIS  RIGHT MIDDLE FINGER ;  Surgeon: GDaryll Brod MD;  [place] [facility] [place]  Service: [place]opedics;  Laterality: Right;   History reviewed. No pertinent family history. Social History   Socioeconomic History   Marital  status: Married    Spouse name: Not on file   Number of children: Not on file   Years of education: Not on file   Highest education level: Not on file  Occupational History   Not on file  Tobacco Use   Smoking status: Former    Types: Cigarettes    Quit date: 06/23/1959    Years since quitting: 63.1   Smokeless tobacco: Never  Substance and Sexual Activity   Alcohol use: No   Drug use: No   Sexual activity: Not on file  Other Topics Concern   Not on file  Social History Narrative   Not on file   Social Determinants of Health   Financial Resource  Strain: Low Risk  (07/21/2022)   Overall Financial Resource Strain (CARDIA)    Difficulty of Paying Living Expenses: Not hard at all  Food Insecurity: No Food Insecurity (07/21/2022)   Hunger Vital Sign    Worried About Running Out of Food in the Last Year: Never true    [place] in the Last Year: Never true  Transportation Needs: No Transportation Needs (07/21/2022)   PRAPARE - Hydrologist (Medical): No    Lack of Transportation (Non-Medical): No  Physical Activity: Sufficiently Active (07/21/2022)   Exercise Vital Sign    Days of Exercise per Week: 5 days    Minutes of Exercise per Session: 30 min  Stress: No Stress Concern Present (07/21/2022)   [place]    Feeling of Stress : Not at all  Social Connections: [place] (07/21/2022)   Social Connection and Isolation Panel [NHANES]    Frequency of Communication with Friends and Family: More than three times a week    Frequency of Social Gatherings with Friends and Family: More than three times a week    Attends Religious Services: More than 4 times per year    Active Member of [employer] or Organizations: Yes    Attends Music therapist: More than 4 times per year    Marital Status: Married    Tobacco Counseling Counseling given: Not Answered   Clinical Intake:  Pre-visit preparation completed: Yes  Pain : No/denies pain Pain Score: 0-No pain     BMI - recorded: 22.93 Nutritional Status: BMI of 19-24  Normal Nutritional Risks: None Diabetes: Yes CBG done?: No Did pt. bring in CBG monitor from home?: No  How often do you need to have someone help you when you read instructions, pamphlets, or other written materials from your doctor or pharmacy?: 1 - Never What is the last grade level you completed in school?: 9th grade; [employer]  Diabetic? yes  Interpreter Needed?: No  Information entered by ::  [employer], LPN.   Activities of Daily Living    07/21/2022   10:03 AM  In your present state of health, do you have any difficulty performing the following activities:  Hearing? 1  Vision? 0  Difficulty concentrating or making decisions? 0  Walking or climbing stairs? 1  Dressing or bathing? 0  Doing errands, shopping? 0  Preparing Food and eating ? N  Using the Toilet? N  In the past six months, have you accidently leaked urine? Y  Do you have problems with loss of bowel control? N  Managing your Medications? N  Managing your Finances? N  Housekeeping or managing your Housekeeping? N    Patient Care Team: Scarlette Calico  L, MD as PCP - General (Internal Medicine) Shawnie Dapper, DO as Consulting Physician (Ophthalmology)  Indicate any recent Medical Services you may have received from other than [facility] providers in the past year (date may be approximate).     Assessment:   This is a routine wellness examination for Demetri.  Hearing/Vision screen Hearing Screening - Comments:: Patient wears hearing aids. Vision Screening - Comments:: Patient wears readers for small print.  Eye bexam done Leda Min, MD.  Dietary issues and exercise activities discussed: Current Exercise Habits: Home exercise routine, Type of exercise: walking;treadmill;Other - see comments, Time (Minutes): 30, Frequency (Times/Week): 5, Weekly Exercise (Minutes/Week): 150, Intensity: Mild, Exercise limited by: orthopedic condition(s)   Goals Addressed             This Visit's Progress    To maintain my current health status by continuing to eat healthy, stay physically active and socially active.        Depression Screen    07/21/2022   10:01 AM 01/21/2022   11:35 AM 07/19/2021   10:39 AM 08/15/2020   10:15 AM  PHQ 2/9 Scores  PHQ - 2 Score 0 0 0 0    Fall Risk    07/21/2022   10:03 AM 01/21/2022   11:35 AM 07/19/2021   10:39 AM 03/03/2021   10:05 AM  [place] in the past year?  0 0 0 1  Number falls in past yr: 0  0 1  Injury with Fall? 0  0 1  Comment    hurt tailbone  Risk for fall due to : No Fall Risks     Follow up Falls prevention discussed       [place]:  Any stairs in or around the home? No  If so, are there any without handrails? No  Home free of loose throw rugs in walkways, pet beds, electrical cords, etc? Yes  Adequate lighting in your home to reduce risk of falls? Yes   ASSISTIVE DEVICES UTILIZED TO PREVENT FALLS:  Life alert? No  Use of a cane, walker or w/c? Yes  Grab bars in the bathroom? Yes  Shower chair or bench in shower? Yes  Elevated toilet seat or a handicapped toilet? Yes   TIMED UP AND GO:  Was the test performed? Yes .  Length of time to ambulate 10 feet: 12 sec.   Gait slow and steady with assistive device  Cognitive Function:        07/21/2022   10:05 AM 07/19/2021   10:46 AM  6CIT Screen  What Year? 0 points 0 points  What month? 0 points 0 points  What time? 0 points 0 points  Count back from 20 0 points 0 points  Months in reverse 0 points 4 points  Repeat phrase 0 points 10 points  Total Score 0 points 14 points    Immunizations Immunization History  Administered Date(s) Administered   Fluad Quad(high Dose 65+) 08/15/2020, 09/05/2021   Influenza Split 09/13/2017   Moderna Sars-Covid-2 Vaccination 11/29/2019, 12/27/2019, 10/06/2020, 03/11/2021   Pneumococcal Conjugate-13 07/17/2014   Pneumococcal Polysaccharide-23 10/27/2000, 10/21/2018   Tdap 10/08/2015, 10/07/2017    TDAP status: Up to date  Flu Vaccine status: Completed at today's visit  Pneumococcal vaccine status: Up to date  Covid-19 vaccine status: Completed vaccines  Qualifies for Shingles Vaccine? Yes   Zostavax completed No   Shingrix Completed?: No.    Education has been provided  regarding the importance of this vaccine. Patient has been advised to call insurance company to determine out of pocket  expense if they have not yet received this vaccine. Advised may also receive vaccine at local pharmacy or Health Dept. Verbalized acceptance and understanding.  Screening Tests Health Maintenance  Topic Date Due   Zoster Vaccines- Shingrix (1 of 2) Never done   OPHTHALMOLOGY EXAM  08/10/2022 (Originally 01/20/2022)   INFLUENZA VACCINE  02/07/2023 (Originally 06/09/2022)   FOOT EXAM  09/19/2022   HEMOGLOBIN A1C  12/09/2022   TETANUS/TDAP  10/08/2027   Pneumonia Vaccine 29+ Years old  Completed   HPV VACCINES  Aged Out   COVID-19 Vaccine  Discontinued    Health Maintenance  Health Maintenance Due  Topic Date Due   Zoster Vaccines- Shingrix (1 of 2) Never done    Colorectal cancer screening: No longer required.   Lung Cancer Screening: (Low Dose CT Chest recommended if Age 21-80 years, 30 pack-year currently smoking OR have quit w/in 15years.) does not qualify.   Lung Cancer Screening Referral: no  Additional Screening:  Hepatitis C Screening: does not qualify; Completed no  Vision Screening: Recommended annual ophthalmology exams for early detection of glaucoma and other disorders of the eye. Is the patient up to date with their annual eye exam?  Yes  Who is the provider or what is the name of the office in which the patient attends annual eye exams? Daryel Gerald, MD. If pt is not established with a provider, would they like to be referred to a provider to establish care? No .   Dental Screening: Recommended annual dental exams for proper oral hygiene  Community Resource Referral / Chronic Care Management: CRR required this visit?  No   CCM required this visit?  No      Plan:     I have personally reviewed and noted the following in the patient's chart:   Medical and social history Use of alcohol, tobacco or illicit drugs  Current medications and supplements including opioid prescriptions. Patient is not currently taking opioid prescriptions. Functional ability and  status Nutritional status Physical activity Advanced directives List of other physicians Hospitalizations, surgeries, and ER visits in previous 12 months Vitals Screenings to include cognitive, depression, and falls Referrals and appointments  In addition, I have reviewed and discussed with patient certain preventive protocols, quality metrics, and best practice recommendations. A written personalized care plan for preventive services as well as general preventive health recommendations were provided to patient.     Sheral Flow, LPN   [id number]   Nurse Notes:  Normal cognitive status assessed by direct observation by this Nurse Health Advisor. No abnormalities found.

## 2022-07-21 NOTE — Patient Instructions (Signed)
Andre Mills , Thank you for taking time to come for your Medicare Wellness Visit. I appreciate your ongoing commitment to your health goals. Please review the following plan we discussed and let me know if I can assist you in the future.   Screening recommendations/referrals: Colonoscopy: Discontinued due to age Recommended yearly ophthalmology/optometry visit for glaucoma screening and checkup Recommended yearly dental visit for hygiene and checkup  Vaccinations: Influenza vaccine: 19/10/2022 Pneumococcal vaccine: 07/17/2014, 10/21/2018 Tdap vaccine: 10/07/2017; due every 10 years Shingles vaccine: never done   Covid-19: 11/29/2019, 12/27/2019, 10/06/2020, 03/11/2021  Advanced directives: Yes  Conditions/risks identified: Yes  Next appointment: Please schedule your next Medicare Wellness Visit with your Nurse Health Advisor in 1 year by calling [phone].  Preventive Care 34 Years and Older, Male Preventive care refers to lifestyle choices and visits with your health care provider that can promote health and wellness. What does preventive care include? A yearly physical exam. This is also called an annual well check. Dental exams once or twice a year. Routine eye exams. Ask your health care provider how often you should have your eyes checked. Personal lifestyle choices, including: Daily care of your teeth and gums. Regular physical activity. Eating a healthy diet. Avoiding tobacco and drug use. Limiting alcohol use. Practicing safe sex. Taking low doses of aspirin every day. Taking vitamin and mineral supplements as recommended by your health care provider. What happens during an annual well check? The services and screenings done by your health care provider during your annual well check will depend on your age, overall health, lifestyle risk factors, and family history of disease. Counseling  Your health care provider may ask you questions about your: Alcohol use. Tobacco  use. Drug use. Emotional well-being. Home and relationship well-being. Sexual activity. Eating habits. History of falls. Memory and ability to understand (cognition). Work and work Statistician. Screening  You may have the following tests or measurements: Height, weight, and BMI. Blood pressure. Lipid and cholesterol levels. These may be checked every 5 years, or more frequently if you are over 57 years old. Skin check. Lung cancer screening. You may have this screening every year starting at age 103 if you have a 30-pack-year history of smoking and currently smoke or have quit within the past 15 years. Fecal occult blood test (FOBT) of the stool. You may have this test every year starting at age 75. Flexible sigmoidoscopy or colonoscopy. You may have a sigmoidoscopy every 5 years or a colonoscopy every 10 years starting at age 77. Prostate cancer screening. Recommendations will vary depending on your family history and other risks. Hepatitis C blood test. Hepatitis B blood test. Sexually transmitted disease (STD) testing. Diabetes screening. This is done by checking your blood sugar (glucose) after you have not eaten for a while (fasting). You may have this done every 1-3 years. Abdominal aortic aneurysm (AAA) screening. You may need this if you are a current or former smoker. Osteoporosis. You may be screened starting at age 36 if you are at high risk. Talk with your health care provider about your test results, treatment options, and if necessary, the need for more tests. Vaccines  Your health care provider may recommend certain vaccines, such as: Influenza vaccine. This is recommended every year. Tetanus, diphtheria, and acellular pertussis (Tdap, Td) vaccine. You may need a Td booster every 10 years. Zoster vaccine. You may need this after age 34. Pneumococcal 13-valent conjugate (PCV13) vaccine. One dose is recommended after age 73. Pneumococcal polysaccharide (PPSV23) vaccine.  One dose is recommended after age 55. Talk to your health care provider about which screenings and vaccines you need and how often you need them. This information is not intended to replace advice given to you by your health care provider. Make sure you discuss any questions you have with your health care provider. Document Released: 11/22/2015 Document Revised: 07/15/2016 Document Reviewed: 08/27/2015 Elsevier Interactive Patient Education  2017 [employer] Prevention in the Home Falls can cause injuries. They can happen to people of all ages. There are many things you can do to make your home safe and to help prevent falls. What can I do on the outside of my home? Regularly fix the edges of walkways and driveways and fix any cracks. Remove anything that might make you trip as you walk through a door, such as a raised step or threshold. Trim any bushes or trees on the path to your home. Use bright outdoor lighting. Clear any walking paths of anything that might make someone trip, such as rocks or tools. Regularly check to see if handrails are loose or broken. Make sure that both sides of any steps have handrails. Any raised decks and porches should have guardrails on the edges. Have any leaves, snow, or ice cleared regularly. Use sand or salt on walking paths during winter. Clean up any spills in your garage right away. This includes oil or grease spills. What can I do in the bathroom? Use night lights. Install grab bars by the toilet and in the tub and shower. Do not use towel bars as grab bars. Use non-skid mats or decals in the tub or shower. If you need to sit down in the shower, use a plastic, non-slip stool. Keep the floor dry. Clean up any water that spills on the floor as soon as it happens. Remove soap buildup in the tub or shower regularly. Attach bath mats securely with double-sided non-slip rug tape. Do not have throw rugs and other things on the floor that can make  you trip. What can I do in the bedroom? Use night lights. Make sure that you have a light by your bed that is easy to reach. Do not use any sheets or blankets that are too big for your bed. They should not hang down onto the floor. Have a firm chair that has side arms. You can use this for support while you get dressed. Do not have throw rugs and other things on the floor that can make you trip. What can I do in the kitchen? Clean up any spills right away. Avoid walking on wet floors. Keep items that you use a lot in easy-to-reach places. If you need to reach something above you, use a strong step stool that has a grab bar. Keep electrical cords out of the way. Do not use floor polish or wax that makes floors slippery. If you must use wax, use non-skid floor wax. Do not have throw rugs and other things on the floor that can make you trip. What can I do with my stairs? Do not leave any items on the stairs. Make sure that there are handrails on both sides of the stairs and use them. Fix handrails that are broken or loose. Make sure that handrails are as long as the stairways. Check any carpeting to make sure that it is firmly attached to the stairs. Fix any carpet that is loose or worn. Avoid having throw rugs at the top or bottom of the  stairs. If you do have throw rugs, attach them to the floor with carpet tape. Make sure that you have a light switch at the top of the stairs and the bottom of the stairs. If you do not have them, ask someone to add them for you. What else can I do to help prevent falls? Wear shoes that: Do not have high heels. Have rubber bottoms. Are comfortable and fit you well. Are closed at the toe. Do not wear sandals. If you use a stepladder: Make sure that it is fully opened. Do not climb a closed stepladder. Make sure that both sides of the stepladder are locked into place. Ask someone to hold it for you, if possible. Clearly mark and make sure that you can  see: Any grab bars or handrails. First and last steps. Where the edge of each step is. Use tools that help you move around (mobility aids) if they are needed. These include: Canes. Walkers. Scooters. Crutches. Turn on the lights when you go into a dark area. Replace any light bulbs as soon as they burn out. Set up your furniture so you have a clear path. Avoid moving your furniture around. If any of your floors are uneven, fix them. If there are any pets around you, be aware of where they are. Review your medicines with your doctor. Some medicines can make you feel dizzy. This can increase your chance of falling. Ask your doctor what other things that you can do to help prevent falls. This information is not intended to replace advice given to you by your health care provider. Make sure you discuss any questions you have with your health care provider. Document Released: 08/22/2009 Document Revised: 04/02/2016 Document Reviewed: 11/30/2014 Elsevier Interactive Patient Education  2017 [employer].

## 2022-07-27 LAB — LIPID PANEL
Cholesterol: 106 (ref 0–200)
HDL: 43 (ref 35–70)
LDL Cholesterol: 38
Triglycerides: 127 (ref 40–160)

## 2022-07-27 LAB — PROTEIN / CREATININE RATIO, URINE: Creatinine, Urine: 69

## 2022-07-27 LAB — MICROALBUMIN, URINE: Microalb, Ur: 2.56

## 2022-07-27 LAB — MICROALBUMIN / CREATININE URINE RATIO: Microalb Creat Ratio: 37.3

## 2022-07-27 LAB — TSH: TSH: 5.38 (ref 0.41–5.90)

## 2022-07-29 ENCOUNTER — Ambulatory Visit (INDEPENDENT_AMBULATORY_CARE_PROVIDER_SITE_OTHER): Payer: Medicare Other

## 2022-07-29 DIAGNOSIS — E538 Deficiency of other specified B group vitamins: Secondary | ICD-10-CM | POA: Diagnosis not present

## 2022-07-29 MED ORDER — CYANOCOBALAMIN 1000 MCG/ML IJ SOLN
1000.0000 ug | Freq: Once | INTRAMUSCULAR | Status: AC
  Administered 2022-07-29: 1000 ug via INTRAMUSCULAR

## 2022-07-29 NOTE — Progress Notes (Signed)
After obtaining consent, and per orders of Dr. Jones, injection of B12 given on the left deltoid by Kyerra Vargo P Bora Broner. Patient instructed to report any adverse reaction to me immediately.  

## 2022-08-10 DIAGNOSIS — H401133 Primary open-angle glaucoma, bilateral, severe stage: Secondary | ICD-10-CM | POA: Diagnosis not present

## 2022-08-10 DIAGNOSIS — E113313 Type 2 diabetes mellitus with moderate nonproliferative diabetic retinopathy with macular edema, bilateral: Secondary | ICD-10-CM | POA: Diagnosis not present

## 2022-08-28 ENCOUNTER — Ambulatory Visit (INDEPENDENT_AMBULATORY_CARE_PROVIDER_SITE_OTHER): Payer: Medicare Other | Admitting: *Deleted

## 2022-08-28 DIAGNOSIS — E538 Deficiency of other specified B group vitamins: Secondary | ICD-10-CM | POA: Diagnosis not present

## 2022-08-28 MED ORDER — CYANOCOBALAMIN 1000 MCG/ML IJ SOLN
1000.0000 ug | Freq: Once | INTRAMUSCULAR | Status: AC
  Administered 2022-08-28: 1000 ug via INTRAMUSCULAR

## 2022-08-28 NOTE — Progress Notes (Signed)
Administered B12 1000 mcg/ml right deltoid. Pt tolerated well.

## 2022-09-30 ENCOUNTER — Ambulatory Visit: Payer: Medicare Other

## 2022-10-02 ENCOUNTER — Ambulatory Visit: Payer: Medicare Other

## 2022-10-05 ENCOUNTER — Ambulatory Visit (INDEPENDENT_AMBULATORY_CARE_PROVIDER_SITE_OTHER): Payer: Medicare Other

## 2022-10-05 DIAGNOSIS — E538 Deficiency of other specified B group vitamins: Secondary | ICD-10-CM | POA: Diagnosis not present

## 2022-10-05 MED ORDER — CYANOCOBALAMIN 1000 MCG/ML IJ SOLN
1000.0000 ug | Freq: Once | INTRAMUSCULAR | Status: AC
  Administered 2022-10-05: 1000 ug via INTRAMUSCULAR

## 2022-10-05 NOTE — Progress Notes (Signed)
After obtaining consent, and per orders of Dr. Jones, injection of B12 was given in the left deltoid by Trestan Vahle P Nesha Counihan. Patient instructed to report any adverse reaction to me immediately.  

## 2022-10-23 ENCOUNTER — Ambulatory Visit (INDEPENDENT_AMBULATORY_CARE_PROVIDER_SITE_OTHER): Payer: Medicare Other | Admitting: Internal Medicine

## 2022-10-23 ENCOUNTER — Encounter: Payer: Self-pay | Admitting: Internal Medicine

## 2022-10-23 ENCOUNTER — Ambulatory Visit (INDEPENDENT_AMBULATORY_CARE_PROVIDER_SITE_OTHER): Payer: Medicare Other

## 2022-10-23 VITALS — BP 158/76 | HR 67 | Temp 97.9°F | Ht 67.0 in | Wt 149.1 lb

## 2022-10-23 DIAGNOSIS — I10 Essential (primary) hypertension: Secondary | ICD-10-CM | POA: Diagnosis not present

## 2022-10-23 DIAGNOSIS — N1832 Chronic kidney disease, stage 3b: Secondary | ICD-10-CM

## 2022-10-23 DIAGNOSIS — E1122 Type 2 diabetes mellitus with diabetic chronic kidney disease: Secondary | ICD-10-CM

## 2022-10-23 DIAGNOSIS — R0781 Pleurodynia: Secondary | ICD-10-CM | POA: Diagnosis not present

## 2022-10-23 DIAGNOSIS — D510 Vitamin B12 deficiency anemia due to intrinsic factor deficiency: Secondary | ICD-10-CM

## 2022-10-23 DIAGNOSIS — J9811 Atelectasis: Secondary | ICD-10-CM | POA: Diagnosis not present

## 2022-10-23 DIAGNOSIS — S20211A Contusion of right front wall of thorax, initial encounter: Secondary | ICD-10-CM | POA: Insufficient documentation

## 2022-10-23 DIAGNOSIS — S299XXA Unspecified injury of thorax, initial encounter: Secondary | ICD-10-CM | POA: Diagnosis not present

## 2022-10-23 LAB — HEPATIC FUNCTION PANEL
ALT: 11 U/L (ref 0–53)
AST: 15 U/L (ref 0–37)
Albumin: 4.1 g/dL (ref 3.5–5.2)
Alkaline Phosphatase: 74 U/L (ref 39–117)
Bilirubin, Direct: 0.1 mg/dL (ref 0.0–0.3)
Total Bilirubin: 0.3 mg/dL (ref 0.2–1.2)
Total Protein: 7.1 g/dL (ref 6.0–8.3)

## 2022-10-23 LAB — CBC WITH DIFFERENTIAL/PLATELET
Basophils Absolute: 0.1 10*3/uL (ref 0.0–0.1)
Basophils Relative: 1 % (ref 0.0–3.0)
Eosinophils Absolute: 0.6 10*3/uL (ref 0.0–0.7)
Eosinophils Relative: 10.8 % — ABNORMAL HIGH (ref 0.0–5.0)
HCT: 34.4 % — ABNORMAL LOW (ref 39.0–52.0)
Hemoglobin: 11.5 g/dL — ABNORMAL LOW (ref 13.0–17.0)
Lymphocytes Relative: 22.3 % (ref 12.0–46.0)
Lymphs Abs: 1.3 10*3/uL (ref 0.7–4.0)
MCHC: 33.4 g/dL (ref 30.0–36.0)
MCV: 90.8 fl (ref 78.0–100.0)
Monocytes Absolute: 0.6 10*3/uL (ref 0.1–1.0)
Monocytes Relative: 9.8 % (ref 3.0–12.0)
Neutro Abs: 3.2 10*3/uL (ref 1.4–7.7)
Neutrophils Relative %: 56.1 % (ref 43.0–77.0)
Platelets: 174 10*3/uL (ref 150.0–400.0)
RBC: 3.78 Mil/uL — ABNORMAL LOW (ref 4.22–5.81)
RDW: 14.7 % (ref 11.5–15.5)
WBC: 5.7 10*3/uL (ref 4.0–10.5)

## 2022-10-23 LAB — HEMOGLOBIN A1C: Hgb A1c MFr Bld: 7.6 % — ABNORMAL HIGH (ref 4.6–6.5)

## 2022-10-23 LAB — BASIC METABOLIC PANEL
BUN: 25 mg/dL — ABNORMAL HIGH (ref 6–23)
CO2: 28 mEq/L (ref 19–32)
Calcium: 9 mg/dL (ref 8.4–10.5)
Chloride: 105 mEq/L (ref 96–112)
Creatinine, Ser: 1.03 mg/dL (ref 0.40–1.50)
GFR: 61.72 mL/min (ref >60.00)
Glucose, Bld: 121 mg/dL — ABNORMAL HIGH (ref 70–99)
Potassium: 5.1 mEq/L (ref 3.5–5.1)
Sodium: 138 mEq/L (ref 135–145)

## 2022-10-23 LAB — FOLATE: Folate: 21.3 ng/mL (ref >5.9)

## 2022-10-23 MED ORDER — TRAMADOL HCL 50 MG PO TABS: 50.0000 mg | ORAL_TABLET | Freq: Three times a day (TID) | ORAL | 0 refills | Status: AC | PRN

## 2022-10-23 NOTE — Progress Notes (Signed)
Subjective:  Patient ID: Andre Mills, male    DOB: [birth year]  Age: 88 y.o. MRN: [id number]  CC: Anemia, Diabetes, and Hypertension   HPI Andre Mills presents for f/up -  He fell about 4 days ago and complains of right anterior chest wall pain.  He has been taking up to 4 g of Tylenol a day but continues to complain of pain with movement and pain while sleeping.  He denies shortness of breath, hemoptysis, or abdominal pain.  Outpatient Medications Prior to Visit  Medication Sig Dispense Refill   Blood Glucose Monitoring Suppl ([place]) w/Device KIT Use to check blood sugar once a day. E11.9 1 kit 0   gabapentin (NEURONTIN) 300 MG capsule Take 300 mg by mouth 2 (two) times daily.     glucose blood (ONETOUCH VERIO) test strip Use as instructed to check blood sugar once a day. E11.9 100 each 5   Insulin Glargine (BASAGLAR KWIKPEN) 100 UNIT/ML Inject 20 Units into the skin daily. 18 mL 1   Insulin Pen Needle 32G X 6 MM MISC 1 Act by Does not apply route daily. 100 each 1   ketoconazole (NIZORAL) 2 % cream Apply 1 application topically 2 (two) times daily. 60 g 2   Lancets (ONETOUCH DELICA PLUS [id number]) MISC 1 Act by Does not apply route daily. E11.9 100 each 5   metFORMIN (GLUCOPHAGE) 500 MG tablet Take 1 tablet (500 mg total) by mouth 2 (two) times daily with a meal. 180 tablet 3   omeprazole (PRILOSEC) 20 MG capsule Take 20 mg by mouth daily.     rosuvastatin (CRESTOR) 20 MG tablet Take 20 mg by mouth daily.     tamsulosin (FLOMAX) 0.4 MG CAPS capsule Take 0.4 mg by mouth daily.     timolol (BLOCADREN) 5 MG tablet Take 5 mg by mouth 2 (two) times daily.     Facility-Administered Medications Prior to Visit  Medication Dose Route Frequency Provider Last Rate Last Admin   cyanocobalamin ((VITAMIN B-12)) injection 1,000 mcg  1,000 mcg Intramuscular Q30 days Janith Lima, MD   1,000 mcg at 06/24/22 1007    ROS Review of Systems  Constitutional: Negative.   Negative for chills, diaphoresis, fatigue and fever.  HENT: Negative.    Eyes: Negative.   Respiratory:  Negative for cough, chest tightness, shortness of breath and wheezing.   Cardiovascular:  Positive for chest pain. Negative for palpitations and leg swelling.  Gastrointestinal:  Negative for abdominal pain, diarrhea, nausea and vomiting.  Genitourinary: Negative.  Negative for difficulty urinating.  Musculoskeletal: Negative.  Negative for arthralgias, joint swelling and myalgias.  Skin: Negative.  Negative for color change, rash and wound.  Allergic/Immunologic: Negative.   Neurological: Negative.  Negative for dizziness and weakness.  Hematological:  Negative for adenopathy. Does not bruise/bleed easily.  Psychiatric/Behavioral: Negative.      Objective:  BP (!) 158/76   Pulse 67   Temp 97.9 F (36.6 C) (Oral)   Ht 5' 7" (1.702 m)   Wt 149 lb 2 oz (67.6 kg)   SpO2 96%   BMI 23.36 kg/m   BP Readings from Last 3 Encounters:  10/23/22 (!) 158/76  07/21/22 118/60  06/24/22 138/68    Wt Readings from Last 3 Encounters:  10/23/22 149 lb 2 oz (67.6 kg)  07/21/22 146 lb 6.4 oz (66.4 kg)  06/24/22 143 lb (64.9 kg)    Physical Exam Vitals reviewed.  Constitutional:  General: He is not in acute distress.    Appearance: He is not ill-appearing, toxic-appearing or diaphoretic.  HENT:     Mouth/Throat:     Mouth: Mucous membranes are moist.  Eyes:     Conjunctiva/sclera: Conjunctivae normal.  Cardiovascular:     Rate and Rhythm: Normal rate and regular rhythm.     Heart sounds: No murmur heard. Pulmonary:     Effort: Pulmonary effort is normal.     Breath sounds: No stridor. No wheezing, rhonchi or rales.  Chest:     Chest wall: Tenderness present. No mass, deformity, swelling, crepitus or edema. There is no dullness to percussion.    Abdominal:     General: Abdomen is flat.     Palpations: There is no mass.     Tenderness: There is no abdominal tenderness.  There is no guarding.     Hernia: No hernia is present.  Musculoskeletal:        General: Normal range of motion.     Cervical back: Neck supple.     Right lower leg: No edema.     Left lower leg: No edema.  Lymphadenopathy:     Cervical: No cervical adenopathy.  Skin:    General: Skin is warm and dry.     Coloration: Skin is not pale.  Neurological:     General: No focal deficit present.     Mental Status: He is alert.  Psychiatric:        Mood and Affect: Mood normal.        Behavior: Behavior normal.     Lab Results  Component Value Date   WBC 5.7 10/23/2022   HGB 11.5 (L) 10/23/2022   HCT 34.4 (L) 10/23/2022   PLT 174.0 10/23/2022   GLUCOSE 121 (H) 10/23/2022   CHOL  05/15/2009    125        ATP III CLASSIFICATION:  <200     mg/dL   Desirable  200-239  mg/dL   Borderline High  >=240    mg/dL   High          TRIG 188 (H) 05/15/2009   HDL 30 (L) 05/15/2009   LDLCALC  05/15/2009    57        Total Cholesterol/HDL:CHD Risk Coronary Heart Disease Risk Table                     Men   Women  1/2 Average Risk   3.4   3.3  Average Risk       5.0   4.4  2 X Average Risk   9.6   7.1  3 X Average Risk  23.4   11.0        Use the calculated Patient Ratio above and the CHD Risk Table to determine the patient's CHD Risk.        ATP III CLASSIFICATION (LDL):  <100     mg/dL   Optimal  100-129  mg/dL   Near or Above                    Optimal  130-159  mg/dL   Borderline  160-189  mg/dL   High  >190     mg/dL   Very High   ALT 11 10/23/2022   AST 15 10/23/2022   NA 138 10/23/2022   K 5.1 10/23/2022   CL 105 10/23/2022   CREATININE 1.03 10/23/2022   BUN 25 (H)  10/23/2022   CO2 28 10/23/2022   TSH 3.61 03/20/2021   INR 1.1 05/14/2009   HGBA1C 7.6 (H) 10/23/2022   MICROALBUR <0.7 09/17/2021    DG Ribs Bilateral W/Chest  Result Date: 11/11/2016 CLINICAL DATA:  [place] several days ago with anterior chest pain particularly right-sided, former smoking history EXAM:  BILATERAL RIBS AND CHEST - 4+ VIEW COMPARISON:  Chest x-ray of 05/14/2009 FINDINGS: No active infiltrate or effusion is seen. No pneumothorax is noted. Mediastinal and hilar contours are unremarkable. The heart is minimally prominent. Bilateral rib detail film were obtained. There is very slight irregularity of the very anterior right seventh rib which could represent a nondisplaced fracture. On the left there appears to be an old fracture involving the left seventh rib anteriorly and laterally with thickening of the cortex. No acute left rib fracture is seen. IMPRESSION: 1. Possible nondisplaced fracture of the anterior right seventh rib. 2. Probable old fracture of the left anterolateral seventh rib. 3. No active lung disease. Electronically Signed   By: [name] M.D.   On: 11/11/2016 11:43    DG Chest 2 View  Result Date: 10/23/2022 CLINICAL DATA:  Right-sided chest and rib pain after fall 2 days ago. EXAM: CHEST - 2 VIEW COMPARISON:  Chest x-ray dated March 03, 2021. FINDINGS: The heart size and mediastinal contours are within normal limits. Normal pulmonary vascularity. Mild bibasilar atelectasis. No focal consolidation, pleural effusion, or pneumothorax. No acute osseous abnormality. IMPRESSION: No active cardiopulmonary disease. Electronically Signed   By: Titus Dubin M.D.   On: 10/23/2022 09:57     Assessment & Plan:   Hagan was seen today for anemia, diabetes and hypertension.  Diagnoses and all orders for this visit:  Trauma of chest, initial encounter-x-ray is negative for fracture or pulmonary contusion.  Will treat for chest wall pain. -     DG Chest 2 View; Future  Contusion, chest wall, right, initial encounter -     traMADol (ULTRAM) 50 MG tablet; Take 1 tablet (50 mg total) by mouth every 8 (eight) hours as needed for up to 5 days.  Primary hypertension- His blood pressure is adequately well-controlled. -     Basic metabolic panel; Future -     Hepatic function panel;  Future -     Hepatic function panel -     Basic metabolic panel  Stage 3b chronic kidney disease (HCC) -     Basic metabolic panel; Future -     Basic metabolic panel  Type 2 diabetes mellitus with stage 3b chronic kidney disease, without long-term current use of insulin ([place])- His renal function is stable. 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 Bolanos start on traMADol. I am also having him maintain his omeprazole, gabapentin, timolol, tamsulosin, OneTouch Delica Plus [id number], OneTouch Verio, OneTouch Verio Flex System, metFORMIN, ketoconazole, rosuvastatin, Insulin Pen Needle, and Basaglar KwikPen. We will continue to administer cyanocobalamin.  Meds ordered this encounter  Medications   traMADol (ULTRAM) 50 MG tablet    Sig: Take 1 tablet (50 mg total) by mouth every 8 (  eight) hours as needed for up to 5 days.    Dispense:  20 tablet    Refill:  0     Follow-up: Return in about 3 months (around 01/22/2023).  Scarlette Calico, MD

## 2022-10-23 NOTE — Patient Instructions (Signed)
Rib Contusion A rib contusion is a deep bruise on the rib area. Contusions are the result of a blunt trauma that causes bleeding and injury to the tissues under the skin. A rib contusion may involve bruising of the ribs and of the skin and muscles in the area. The skin over the contusion may turn blue, purple, or yellow. Minor injuries result in a painless contusion. More severe contusions may be painful and swollen for a few weeks. What are the causes? This condition is usually caused by a hard, direct hit to an area of the body. This often occurs while playing contact sports. What are the signs or symptoms? Symptoms of this condition include: Swelling and redness of the injured area. Discoloration of the injured area. Tenderness and soreness of the injured area. Pain with or without movement. Pain when breathing in. How is this diagnosed? This condition may be diagnosed based on: Your symptoms and medical history. A physical exam. Imaging tests--such as an X-ray, CT scan, or MRI--to determine if there were internal injuries or broken bones (fractures). How is this treated? This condition may be treated with: Rest. This is often the best treatment for a rib contusion. Ice packs. This reduces swelling and inflammation. Deep-breathing exercises. These may be recommended to reduce the risk for lung collapse and pneumonia. Medicines. Over-the-counter or prescription medicines may be given to control pain. Injection of a numbing medicine around the nerve near your injury (nerve block). Follow these instructions at home: Medicines Take over-the-counter and prescription medicines only as told by your health care provider. Ask your health care provider if the medicine prescribed to you: Requires you to avoid driving or using machinery. Can cause constipation. You may need to take these actions to prevent or treat constipation: Drink enough fluid to keep your urine pale yellow. Take  over-the-counter or prescription medicines. Eat foods that are high in fiber, such as beans, whole grains, and fresh fruits and vegetables. Limit foods that are high in fat and processed sugars, such as fried or sweet foods. Managing pain, stiffness, and swelling If directed, put ice on the injured area. To do this: Put ice in a plastic bag. Place a towel between your skin and the bag. Leave the ice on for 20 minutes, 2-3 times a day. Remove the ice if your skin turns bright red. This is very important. If you cannot feel pain, heat, or cold, you have a greater risk of damage to the area.  Activity Rest the injured area. Avoid strenuous activity and any activities or movements that cause pain. Be careful during activities, and avoid bumping the injured area. Do not lift anything that is heavier than 5 lb (2.3 kg), or the limit that you are told, until your health care provider says that it is safe. General instructions  Do not use any products that contain nicotine or tobacco, such as cigarettes, e-cigarettes, and chewing tobacco. These can delay healing. If you need help quitting, ask your health care provider. Do deep-breathing exercises as told by your health care provider. If you were given an incentive spirometer, use it every 1-2 hours while you are awake, or as recommended by your health care provider. This device measures how well you are filling your lungs with each breath. Keep all follow-up visits. This is important. Contact a health care provider if you have: Increased bruising or swelling. Pain that is not controlled with treatment. A fever. Get help right away if you: Have difficulty breathing or  shortness of breath. Develop a continual cough, or you cough up thick or bloody mucus from your lungs (sputum). Feel nauseous or you vomit. Have pain in your abdomen. These symptoms may represent a serious problem that is an emergency. Do not wait to see if the symptoms will go  away. Get medical help right away. Call your local emergency services (911 in the [place] Do not drive yourself to the hospital. Summary A rib contusion is a deep bruise on your rib area. Contusions are the result of a blunt trauma that causes bleeding and injury to the tissues under the skin. The skin over the contusion may turn blue, purple, or yellow. Minor injuries may cause a painless contusion. More severe contusions may be painful and swollen for a few weeks. Rest the injured area. Avoid strenuous activity and any activities or movements that cause pain. This information is not intended to replace advice given to you by your health care provider. Make sure you discuss any questions you have with your health care provider. Document Revised: 01/31/2020 Document Reviewed: 01/31/2020 Elsevier Patient Education  [place].

## 2022-11-03 DIAGNOSIS — Z23 Encounter for immunization: Secondary | ICD-10-CM | POA: Diagnosis not present

## 2022-11-06 ENCOUNTER — Ambulatory Visit (INDEPENDENT_AMBULATORY_CARE_PROVIDER_SITE_OTHER): Payer: Medicare Other | Admitting: *Deleted

## 2022-11-06 DIAGNOSIS — D510 Vitamin B12 deficiency anemia due to intrinsic factor deficiency: Secondary | ICD-10-CM | POA: Diagnosis not present

## 2022-11-06 MED ORDER — CYANOCOBALAMIN 1000 MCG/ML IJ SOLN
1000.0000 ug | Freq: Once | INTRAMUSCULAR | Status: AC
  Administered 2022-11-06: 1000 ug via INTRAMUSCULAR

## 2022-11-06 NOTE — Progress Notes (Signed)
Pls cosign for B12 inj../lmb  

## 2022-12-03 DIAGNOSIS — L853 Xerosis cutis: Secondary | ICD-10-CM | POA: Diagnosis not present

## 2022-12-03 DIAGNOSIS — Z85828 Personal history of other malignant neoplasm of skin: Secondary | ICD-10-CM | POA: Diagnosis not present

## 2022-12-03 DIAGNOSIS — B354 Tinea corporis: Secondary | ICD-10-CM | POA: Diagnosis not present

## 2022-12-07 ENCOUNTER — Ambulatory Visit (INDEPENDENT_AMBULATORY_CARE_PROVIDER_SITE_OTHER): Payer: Medicare Other

## 2022-12-07 DIAGNOSIS — E538 Deficiency of other specified B group vitamins: Secondary | ICD-10-CM | POA: Diagnosis not present

## 2022-12-07 NOTE — Progress Notes (Signed)
Pt here for monthly B12 injection per Dr. Ronnald Ramp  B12 1064mg given IM, and pt tolerated injection well.  Next B12 injection scheduled for 01/07/23

## 2022-12-14 DIAGNOSIS — Z961 Presence of intraocular lens: Secondary | ICD-10-CM | POA: Diagnosis not present

## 2022-12-14 DIAGNOSIS — H401133 Primary open-angle glaucoma, bilateral, severe stage: Secondary | ICD-10-CM | POA: Diagnosis not present

## 2022-12-14 DIAGNOSIS — H35033 Hypertensive retinopathy, bilateral: Secondary | ICD-10-CM | POA: Diagnosis not present

## 2022-12-14 DIAGNOSIS — E113313 Type 2 diabetes mellitus with moderate nonproliferative diabetic retinopathy with macular edema, bilateral: Secondary | ICD-10-CM | POA: Diagnosis not present

## 2022-12-14 LAB — HM DIABETES EYE EXAM

## 2022-12-23 ENCOUNTER — Encounter: Payer: Self-pay | Admitting: Internal Medicine

## 2022-12-23 ENCOUNTER — Ambulatory Visit (INDEPENDENT_AMBULATORY_CARE_PROVIDER_SITE_OTHER): Payer: Medicare Other | Admitting: Internal Medicine

## 2022-12-23 VITALS — BP 142/64 | HR 75 | Temp 97.6°F | Resp 16 | Ht 67.0 in | Wt 146.0 lb

## 2022-12-23 DIAGNOSIS — E785 Hyperlipidemia, unspecified: Secondary | ICD-10-CM

## 2022-12-23 DIAGNOSIS — R64 Cachexia: Secondary | ICD-10-CM

## 2022-12-23 DIAGNOSIS — E1122 Type 2 diabetes mellitus with diabetic chronic kidney disease: Secondary | ICD-10-CM | POA: Diagnosis not present

## 2022-12-23 DIAGNOSIS — I10 Essential (primary) hypertension: Secondary | ICD-10-CM

## 2022-12-23 DIAGNOSIS — N1832 Chronic kidney disease, stage 3b: Secondary | ICD-10-CM | POA: Diagnosis not present

## 2022-12-23 NOTE — Patient Instructions (Signed)

## 2022-12-23 NOTE — Progress Notes (Signed)
Subjective:  Patient ID: Andre Mills, male    DOB: 1924/12/18  Age: 89 y.o. MRN: [id number]  CC: Anemia, Hypertension, Hyperlipidemia, and Diabetes   HPI Andre Mills presents for f/up ---  He is active and denies chest pain, shortness of breath, diaphoresis, or edema.  Outpatient Medications Prior to Visit  Medication Sig Dispense Refill   Blood Glucose Monitoring Suppl ([place]) w/Device KIT Use to check blood sugar once a day. E11.9 1 kit 0   gabapentin (NEURONTIN) 300 MG capsule Take 300 mg by mouth 2 (two) times daily.     glucose blood (ONETOUCH VERIO) test strip Use as instructed to check blood sugar once a day. E11.9 100 each 5   Insulin Glargine (BASAGLAR KWIKPEN) 100 UNIT/ML Inject 20 Units into the skin daily. 18 mL 1   Insulin Pen Needle 32G X 6 MM MISC 1 Act by Does not apply route daily. 100 each 1   ketoconazole (NIZORAL) 2 % cream Apply 1 application topically 2 (two) times daily. 60 g 2   Lancets (ONETOUCH DELICA PLUS [id number]) MISC 1 Act by Does not apply route daily. E11.9 100 each 5   metFORMIN (GLUCOPHAGE) 500 MG tablet Take 1 tablet (500 mg total) by mouth 2 (two) times daily with a meal. 180 tablet 3   omeprazole (PRILOSEC) 20 MG capsule Take 20 mg by mouth daily.     rosuvastatin (CRESTOR) 20 MG tablet Take 20 mg by mouth daily.     tamsulosin (FLOMAX) 0.4 MG CAPS capsule Take 0.4 mg by mouth daily.     timolol (BLOCADREN) 5 MG tablet Take 5 mg by mouth 2 (two) times daily.     Facility-Administered Medications Prior to Visit  Medication Dose Route Frequency Provider Last Rate Last Admin   cyanocobalamin ((VITAMIN B-12)) injection 1,000 mcg  1,000 mcg Intramuscular Q30 days Andre Lima, MD   1,000 mcg at 12/07/22 0940    ROS Review of Systems  Constitutional:  Negative for chills, diaphoresis, fatigue and fever.  HENT: Negative.    Eyes: Negative.   Respiratory: Negative.  Negative for cough, shortness of breath and wheezing.    Cardiovascular:  Negative for chest pain, palpitations and leg swelling.  Gastrointestinal:  Negative for abdominal pain and diarrhea.  Endocrine: Negative.   Genitourinary: Negative.  Negative for difficulty urinating.  Musculoskeletal:  Positive for arthralgias. Negative for myalgias.  Skin: Negative.   Neurological:  Negative for dizziness and weakness.  Hematological:  Negative for adenopathy. Does not bruise/bleed easily.  Psychiatric/Behavioral: Negative.      Objective:  BP (!) 142/64 (BP Location: Right Arm, Patient Position: Sitting, Cuff Size: Normal)   Pulse 75   Temp 97.6 F (36.4 C) (Oral)   Resp 16   Ht 5' 7"$  (1.702 m)   Wt 146 lb (66.2 kg)   SpO2 97%   BMI 22.87 kg/m   BP Readings from Last 3 Encounters:  12/23/22 (!) 142/64  10/23/22 (!) 158/76  07/21/22 118/60    Wt Readings from Last 3 Encounters:  12/23/22 146 lb (66.2 kg)  10/23/22 149 lb 2 oz (67.6 kg)  07/21/22 146 lb 6.4 oz (66.4 kg)    Physical Exam Vitals reviewed.  HENT:     Nose: Nose normal.     Mouth/Throat:     Mouth: Mucous membranes are moist.  Eyes:     General: No scleral icterus.    Conjunctiva/sclera: Conjunctivae normal.  Cardiovascular:  Rate and Rhythm: Normal rate and regular rhythm.     Heart sounds: No murmur heard. Pulmonary:     Effort: Pulmonary effort is normal.     Breath sounds: No stridor. No wheezing, rhonchi or rales.  Abdominal:     General: Abdomen is flat.     Palpations: There is no mass.     Tenderness: There is no abdominal tenderness. There is no guarding.     Hernia: No hernia is present.  Musculoskeletal:        General: Normal range of motion.     Cervical back: Neck supple.     Right lower leg: No edema.     Left lower leg: No edema.  Lymphadenopathy:     Cervical: No cervical adenopathy.  Skin:    General: Skin is warm and dry.  Neurological:     Mental Status: He is alert. Mental status is at baseline.  Psychiatric:        Mood and  Affect: Mood normal.        Behavior: Behavior normal.     Lab Results  Component Value Date   WBC 5.7 10/23/2022   HGB 11.5 (L) 10/23/2022   HCT 34.4 (L) 10/23/2022   PLT 174.0 10/23/2022   GLUCOSE 121 (H) 10/23/2022   CHOL 106 07/27/2022   TRIG 127 07/27/2022   HDL 43 07/27/2022   LDLCALC 38 07/27/2022   ALT 11 10/23/2022   AST 15 10/23/2022   NA 138 10/23/2022   K 5.1 10/23/2022   CL 105 10/23/2022   CREATININE 1.03 10/23/2022   BUN 25 (H) 10/23/2022   CO2 28 10/23/2022   TSH 5.38 07/27/2022   INR 1.1 05/14/2009   HGBA1C 7.6 (H) 10/23/2022   MICROALBUR 2.56 07/27/2022    DG Ribs Bilateral W/Chest  Result Date: 11/11/2016 CLINICAL DATA:  [address]re [address] several days ago with anterior chest pain particularly right-sided, former smoking history EXAM: BILATERAL RIBS AND CHEST - 4+ VIEW COMPARISON:  Chest x-ray of 05/14/2009 FINDINGS: No active infiltrate or effusion is seen. No pneumothorax is noted. Mediastinal and hilar contours are unremarkable. The heart is minimally prominent. Bilateral rib detail film were obtained. There is very slight irregularity of the very anterior right seventh rib which could represent a nondisplaced fracture. On the left there appears to be an old fracture involving the left seventh rib anteriorly and laterally with thickening of the cortex. No acute left rib fracture is seen. IMPRESSION: 1. Possible nondisplaced fracture of the anterior right seventh rib. 2. Probable old fracture of the left anterolateral seventh rib. 3. No active lung disease. Electronically Signed   By: Andre Mills M.D.   On: 11/11/2016 11:43    Assessment & Plan:   Andre Mills was seen today for anemia, hypertension, hyperlipidemia and diabetes.  Diagnoses and all orders for this visit:  Primary hypertension- His BP is well controlled.  Type 2 diabetes mellitus with stage 3b chronic kidney disease, without long-term current use of insulin ([place])- His blood sugar has been well controlled. -      HM Diabetes Foot Exam  Stage 3b chronic kidney disease ([place])- He is avoiding nephrotoxic agents.  Dyslipidemia, goal LDL below 100- Labs reviewed from the [place] and he has achieved his LDL goal.   I am having Andre Mills maintain his omeprazole, gabapentin, timolol, tamsulosin, OneTouch Delica Plus 0000000, OneTouch Verio, OneTouch Verio Flex System, metFORMIN, ketoconazole, rosuvastatin, Insulin Pen Needle, and Basaglar KwikPen. We will continue to administer cyanocobalamin.  No orders of the defined types were placed in this encounter.    Follow-up: Return in about 3 months (around 03/23/2023).  Scarlette Calico, MD

## 2022-12-25 DIAGNOSIS — E785 Hyperlipidemia, unspecified: Secondary | ICD-10-CM | POA: Insufficient documentation

## 2023-01-07 ENCOUNTER — Ambulatory Visit (INDEPENDENT_AMBULATORY_CARE_PROVIDER_SITE_OTHER): Payer: Medicare Other

## 2023-01-07 DIAGNOSIS — D510 Vitamin B12 deficiency anemia due to intrinsic factor deficiency: Secondary | ICD-10-CM | POA: Diagnosis not present

## 2023-01-07 MED ORDER — CYANOCOBALAMIN 1000 MCG/ML IJ SOLN
1000.0000 ug | Freq: Once | INTRAMUSCULAR | Status: AC
  Administered 2023-01-07: 1000 ug via INTRAMUSCULAR

## 2023-01-07 NOTE — Progress Notes (Signed)
Pt was given B12 injection w/o any complications. 

## 2023-02-08 ENCOUNTER — Ambulatory Visit (INDEPENDENT_AMBULATORY_CARE_PROVIDER_SITE_OTHER): Payer: Medicare Other

## 2023-02-08 DIAGNOSIS — E538 Deficiency of other specified B group vitamins: Secondary | ICD-10-CM

## 2023-02-08 NOTE — Progress Notes (Signed)
Pt here for monthly B12 injection per Dr. Jones  B12 1000mcg given IM, and pt tolerated injection well.   

## 2023-02-11 DIAGNOSIS — H1045 Other chronic allergic conjunctivitis: Secondary | ICD-10-CM | POA: Diagnosis not present

## 2023-02-11 DIAGNOSIS — H02052 Trichiasis without entropian right lower eyelid: Secondary | ICD-10-CM | POA: Diagnosis not present

## 2023-03-10 ENCOUNTER — Ambulatory Visit (INDEPENDENT_AMBULATORY_CARE_PROVIDER_SITE_OTHER): Payer: Medicare Other

## 2023-03-10 DIAGNOSIS — E538 Deficiency of other specified B group vitamins: Secondary | ICD-10-CM | POA: Diagnosis not present

## 2023-03-10 MED ORDER — CYANOCOBALAMIN 1000 MCG/ML IJ SOLN
1000.0000 ug | Freq: Once | INTRAMUSCULAR | Status: AC
  Administered 2023-03-10: 1000 ug via INTRAMUSCULAR

## 2023-03-10 NOTE — Progress Notes (Signed)
After obtaining consent, and per orders of Dr. Jones, injection of B12 given by Dellas Guard P Teng Decou. Patient instructed to report any adverse reaction to me immediately.  

## 2023-03-23 ENCOUNTER — Ambulatory Visit (INDEPENDENT_AMBULATORY_CARE_PROVIDER_SITE_OTHER): Payer: Medicare Other | Admitting: Internal Medicine

## 2023-03-23 ENCOUNTER — Encounter: Payer: Self-pay | Admitting: Internal Medicine

## 2023-03-23 VITALS — BP 152/68 | HR 61 | Temp 97.9°F | Ht 67.0 in | Wt 148.0 lb

## 2023-03-23 DIAGNOSIS — Z794 Long term (current) use of insulin: Secondary | ICD-10-CM | POA: Diagnosis not present

## 2023-03-23 DIAGNOSIS — L603 Nail dystrophy: Secondary | ICD-10-CM | POA: Insufficient documentation

## 2023-03-23 DIAGNOSIS — Z7984 Long term (current) use of oral hypoglycemic drugs: Secondary | ICD-10-CM

## 2023-03-23 DIAGNOSIS — I10 Essential (primary) hypertension: Secondary | ICD-10-CM | POA: Diagnosis not present

## 2023-03-23 DIAGNOSIS — E1122 Type 2 diabetes mellitus with diabetic chronic kidney disease: Secondary | ICD-10-CM | POA: Diagnosis not present

## 2023-03-23 DIAGNOSIS — D508 Other iron deficiency anemias: Secondary | ICD-10-CM

## 2023-03-23 DIAGNOSIS — H903 Sensorineural hearing loss, bilateral: Secondary | ICD-10-CM | POA: Insufficient documentation

## 2023-03-23 DIAGNOSIS — N1832 Chronic kidney disease, stage 3b: Secondary | ICD-10-CM

## 2023-03-23 DIAGNOSIS — D510 Vitamin B12 deficiency anemia due to intrinsic factor deficiency: Secondary | ICD-10-CM | POA: Diagnosis not present

## 2023-03-23 LAB — CBC WITH DIFFERENTIAL/PLATELET
Basophils Absolute: 0.1 10*3/uL (ref 0.0–0.1)
Basophils Relative: 1.4 % (ref 0.0–3.0)
Eosinophils Absolute: 0.6 10*3/uL (ref 0.0–0.7)
Eosinophils Relative: 10.9 % — ABNORMAL HIGH (ref 0.0–5.0)
HCT: 35 % — ABNORMAL LOW (ref 39.0–52.0)
Hemoglobin: 11.6 g/dL — ABNORMAL LOW (ref 13.0–17.0)
Lymphocytes Relative: 29.4 % (ref 12.0–46.0)
Lymphs Abs: 1.6 10*3/uL (ref 0.7–4.0)
MCHC: 33.3 g/dL (ref 30.0–36.0)
MCV: 91.3 fl (ref 78.0–100.0)
Monocytes Absolute: 0.4 10*3/uL (ref 0.1–1.0)
Monocytes Relative: 7.6 % (ref 3.0–12.0)
Neutro Abs: 2.8 10*3/uL (ref 1.4–7.7)
Neutrophils Relative %: 50.7 % (ref 43.0–77.0)
Platelets: 172 10*3/uL (ref 150.0–400.0)
RBC: 3.83 Mil/uL — ABNORMAL LOW (ref 4.22–5.81)
RDW: 15.8 % — ABNORMAL HIGH (ref 11.5–15.5)
WBC: 5.5 10*3/uL (ref 4.0–10.5)

## 2023-03-23 LAB — BASIC METABOLIC PANEL
BUN: 25 mg/dL — ABNORMAL HIGH (ref 6–23)
CO2: 28 mEq/L (ref 19–32)
Calcium: 8.7 mg/dL (ref 8.4–10.5)
Chloride: 105 mEq/L (ref 96–112)
Creatinine, Ser: 1.05 mg/dL (ref 0.40–1.50)
GFR: 60.14 mL/min (ref >60.00)
Glucose, Bld: 119 mg/dL — ABNORMAL HIGH (ref 70–99)
Potassium: 4.4 mEq/L (ref 3.5–5.1)
Sodium: 140 mEq/L (ref 135–145)

## 2023-03-23 LAB — IBC + FERRITIN
Ferritin: 17.8 ng/mL — ABNORMAL LOW (ref 22.0–322.0)
Iron: 93 ug/dL (ref 42–165)
Saturation Ratios: 24.4 % (ref 20.0–50.0)
TIBC: 380.8 ug/dL (ref 250.0–450.0)
Transferrin: 272 mg/dL (ref 212.0–360.0)

## 2023-03-23 LAB — HEMOGLOBIN A1C: Hgb A1c MFr Bld: 7.3 % — ABNORMAL HIGH (ref 4.6–6.5)

## 2023-03-23 NOTE — Patient Instructions (Signed)

## 2023-03-23 NOTE — Progress Notes (Signed)
Subjective:  Patient ID: Andre Mills, male    DOB: 09/28/1925  Age: 89 y.o. MRN: [id number]  CC: Hypertension and Anemia   HPI Andre Mills presents for f/up ---  He is active and denies chest pain, shortness of breath, diaphoresis, or edema.  Outpatient Medications Prior to Visit  Medication Sig Dispense Refill   Alogliptin Benzoate 25 MG TABS Take 25 mg by mouth daily. Take one half tablet     Blood Glucose Monitoring Suppl (ONETOUCH VERIO FLEX SYSTEM) w/Device KIT Use to check blood sugar once a day. E11.9 1 kit 0   calcium-vitamin D (OSCAL WITH D) 500-5 MG-MCG tablet Take 1 tablet by mouth daily.     ferrous sulfate 325 (65 FE) MG tablet Take 325 mg by mouth daily with breakfast.     gabapentin (NEURONTIN) 300 MG capsule Take 300 mg by mouth 2 (two) times daily.     glucose blood (ONETOUCH VERIO) test strip Use as instructed to check blood sugar once a day. E11.9 100 each 5   Insulin Glargine (BASAGLAR KWIKPEN) 100 UNIT/ML Inject 20 Units into the skin daily. 18 mL 1   Insulin Pen Needle 32G X 6 MM MISC 1 Act by Does not apply route daily. 100 each 1   ketoconazole (NIZORAL) 2 % cream Apply 1 application topically 2 (two) times daily. 60 g 2   Lancets (ONETOUCH DELICA PLUS LANCET33G) MISC 1 Act by Does not apply route daily. E11.9 100 each 5   metFORMIN (GLUCOPHAGE) 500 MG tablet Take 1 tablet (500 mg total) by mouth 2 (two) times daily with a meal. 180 tablet 3   omeprazole (PRILOSEC) 20 MG capsule Take 20 mg by mouth daily.     rosuvastatin (CRESTOR) 20 MG tablet Take 20 mg by mouth daily.     tamsulosin (FLOMAX) 0.4 MG CAPS capsule Take 0.4 mg by mouth daily.     timolol (BLOCADREN) 5 MG tablet Take 5 mg by mouth 2 (two) times daily.     Facility-Administered Medications Prior to Visit  Medication Dose Route Frequency Provider Last Rate Last Admin   cyanocobalamin ((VITAMIN B-12)) injection 1,000 mcg  1,000 mcg Intramuscular Q30 days Etta Grandchild, MD   1,000 mcg at  02/08/23 0859    ROS Review of Systems  Constitutional:  Negative for diaphoresis and fatigue.  HENT: Negative.    Eyes: Negative.   Respiratory:  Negative for cough, chest tightness, shortness of breath and wheezing.   Cardiovascular:  Negative for chest pain, palpitations and leg swelling.  Gastrointestinal:  Negative for abdominal pain, constipation, nausea and vomiting.  Endocrine: Negative.   Genitourinary: Negative.  Negative for difficulty urinating.  Musculoskeletal: Negative.  Negative for myalgias.  Skin: Negative.   Neurological:  Negative for dizziness, weakness, light-headedness and headaches.  Hematological:  Negative for adenopathy. Does not bruise/bleed easily.  Psychiatric/Behavioral: Negative.      Objective:  BP (!) 152/68 (BP Location: Left Arm, Patient Position: Sitting, Cuff Size: Normal)   Pulse 61   Temp 97.9 F (36.6 C) (Oral)   Ht 5\' 7"  (1.702 m)   Wt 148 lb (67.1 kg)   SpO2 95%   BMI 23.18 kg/m   BP Readings from Last 3 Encounters:  03/23/23 (!) 152/68  12/23/22 (!) 142/64  10/23/22 (!) 158/76    Wt Readings from Last 3 Encounters:  03/23/23 148 lb (67.1 kg)  12/23/22 146 lb (66.2 kg)  10/23/22 149 lb 2 oz (67.6 kg)  Physical Exam Vitals reviewed.  Constitutional:      Appearance: He is not ill-appearing.  HENT:     Nose: Nose normal.     Mouth/Throat:     Mouth: Mucous membranes are moist.  Eyes:     General: No scleral icterus.    Conjunctiva/sclera: Conjunctivae normal.  Cardiovascular:     Rate and Rhythm: Normal rate and regular rhythm.     Heart sounds: No murmur heard. Pulmonary:     Effort: Pulmonary effort is normal.     Breath sounds: No stridor. No wheezing, rhonchi or rales.  Abdominal:     General: Abdomen is flat.     Palpations: There is no mass.     Tenderness: There is no abdominal tenderness. There is no guarding.     Hernia: No hernia is present.  Musculoskeletal:        General: Normal range of motion.      Cervical back: Neck supple.     Right lower leg: No edema.     Left lower leg: No edema.  Lymphadenopathy:     Cervical: No cervical adenopathy.  Skin:    Coloration: Skin is pale.     Findings: No rash.  Neurological:     General: No focal deficit present.     Mental Status: He is alert. Mental status is at baseline.  Psychiatric:        Mood and Affect: Mood normal.        Behavior: Behavior normal.     Lab Results  Component Value Date   WBC 5.5 03/23/2023   HGB 11.6 (L) 03/23/2023   HCT 35.0 (L) 03/23/2023   PLT 172.0 03/23/2023   GLUCOSE 119 (H) 03/23/2023   CHOL 106 07/27/2022   TRIG 127 07/27/2022   HDL 43 07/27/2022   LDLCALC 38 07/27/2022   ALT 11 10/23/2022   AST 15 10/23/2022   NA 140 03/23/2023   K 4.4 03/23/2023   CL 105 03/23/2023   CREATININE 1.05 03/23/2023   BUN 25 (H) 03/23/2023   CO2 28 03/23/2023   TSH 5.38 07/27/2022   INR 1.1 05/14/2009   HGBA1C 7.3 (H) 03/23/2023   MICROALBUR 2.56 07/27/2022    DG Ribs Bilateral W/Chest  Result Date: 11/11/2016 CLINICAL DATA:  Larey Seat several days ago with anterior chest pain particularly right-sided, former smoking history EXAM: BILATERAL RIBS AND CHEST - 4+ VIEW COMPARISON:  Chest x-ray of 05/14/2009 FINDINGS: No active infiltrate or effusion is seen. No pneumothorax is noted. Mediastinal and hilar contours are unremarkable. The heart is minimally prominent. Bilateral rib detail film were obtained. There is very slight irregularity of the very anterior right seventh rib which could represent a nondisplaced fracture. On the left there appears to be an old fracture involving the left seventh rib anteriorly and laterally with thickening of the cortex. No acute left rib fracture is seen. IMPRESSION: 1. Possible nondisplaced fracture of the anterior right seventh rib. 2. Probable old fracture of the left anterolateral seventh rib. 3. No active lung disease. Electronically Signed   By: Dwyane Dee M.D.   On: 11/11/2016  11:43    Assessment & Plan:   Vitamin B12 deficiency anemia due to intrinsic factor deficiency -     CBC with Differential/Platelet; Future  Primary hypertension-his blood pressure is adequately well-controlled. -     Basic metabolic panel; Future -     CBC with Differential/Platelet; Future  Type 2 diabetes mellitus with stage 3b chronic kidney  disease, without long-term current use of insulin (HCC)-his blood sugar is well-controlled. -     Basic metabolic panel; Future -     Hemoglobin A1c; Future  Stage 3b chronic kidney disease (HCC)-his renal function is stable. -     Basic metabolic panel; Future  Iron deficiency anemia secondary to inadequate dietary iron intake -     IBC + Ferritin; Future     Follow-up: Return in about 6 months (around 09/23/2023).  Sanda Linger, MD

## 2023-04-12 ENCOUNTER — Ambulatory Visit (INDEPENDENT_AMBULATORY_CARE_PROVIDER_SITE_OTHER): Payer: Medicare Other | Admitting: Radiology

## 2023-04-12 DIAGNOSIS — E538 Deficiency of other specified B group vitamins: Secondary | ICD-10-CM | POA: Diagnosis not present

## 2023-04-12 DIAGNOSIS — D510 Vitamin B12 deficiency anemia due to intrinsic factor deficiency: Secondary | ICD-10-CM

## 2023-04-12 MED ORDER — CYANOCOBALAMIN 1000 MCG/ML IJ SOLN
1000.0000 ug | Freq: Once | INTRAMUSCULAR | Status: AC
Start: 2023-04-12 — End: 2023-04-12
  Administered 2023-04-12: 1000 ug via INTRAMUSCULAR

## 2023-04-12 NOTE — Progress Notes (Signed)
Pt here for monthly B12 injection per Dr. Yetta Barre   B12 given IM. and pt tolerated injection well.

## 2023-05-12 ENCOUNTER — Ambulatory Visit (INDEPENDENT_AMBULATORY_CARE_PROVIDER_SITE_OTHER): Payer: Medicare Other

## 2023-05-12 DIAGNOSIS — E538 Deficiency of other specified B group vitamins: Secondary | ICD-10-CM | POA: Diagnosis not present

## 2023-05-12 MED ORDER — CYANOCOBALAMIN 1000 MCG/ML IJ SOLN
1000.0000 ug | Freq: Once | INTRAMUSCULAR | Status: AC
Start: 2023-05-12 — End: 2023-05-12
  Administered 2023-05-12: 1000 ug via INTRAMUSCULAR

## 2023-05-12 NOTE — Progress Notes (Signed)
Pt here for monthly B12 injection per   B12 1000mcg given IM and pt tolerated injection well.   

## 2023-06-14 ENCOUNTER — Ambulatory Visit (INDEPENDENT_AMBULATORY_CARE_PROVIDER_SITE_OTHER): Payer: Medicare Other

## 2023-06-14 DIAGNOSIS — E538 Deficiency of other specified B group vitamins: Secondary | ICD-10-CM | POA: Diagnosis not present

## 2023-06-14 MED ORDER — CYANOCOBALAMIN 1000 MCG/ML IJ SOLN
1000.0000 ug | Freq: Once | INTRAMUSCULAR | Status: AC
Start: 2023-06-14 — End: 2023-06-14
  Administered 2023-06-14: 1000 ug via INTRAMUSCULAR

## 2023-06-14 NOTE — Progress Notes (Signed)
After obtaining consent, and per orders of Dr. Ronnald Ramp, injection of B12 given by Marrian Salvage. Patient instructed to report any adverse reaction to me immediately.

## 2023-06-24 ENCOUNTER — Encounter (INDEPENDENT_AMBULATORY_CARE_PROVIDER_SITE_OTHER): Payer: Self-pay

## 2023-07-13 DIAGNOSIS — L570 Actinic keratosis: Secondary | ICD-10-CM | POA: Diagnosis not present

## 2023-07-13 DIAGNOSIS — L300 Nummular dermatitis: Secondary | ICD-10-CM | POA: Diagnosis not present

## 2023-07-13 DIAGNOSIS — B356 Tinea cruris: Secondary | ICD-10-CM | POA: Diagnosis not present

## 2023-07-13 DIAGNOSIS — Z85828 Personal history of other malignant neoplasm of skin: Secondary | ICD-10-CM | POA: Diagnosis not present

## 2023-07-14 DIAGNOSIS — M65322 Trigger finger, left index finger: Secondary | ICD-10-CM | POA: Diagnosis not present

## 2023-07-14 DIAGNOSIS — M65332 Trigger finger, left middle finger: Secondary | ICD-10-CM | POA: Diagnosis not present

## 2023-07-19 ENCOUNTER — Ambulatory Visit (INDEPENDENT_AMBULATORY_CARE_PROVIDER_SITE_OTHER): Payer: Medicare Other | Admitting: *Deleted

## 2023-07-19 DIAGNOSIS — E538 Deficiency of other specified B group vitamins: Secondary | ICD-10-CM | POA: Diagnosis not present

## 2023-07-19 MED ORDER — CYANOCOBALAMIN 1000 MCG/ML IJ SOLN
1000.0000 ug | Freq: Once | INTRAMUSCULAR | Status: AC
Start: 2023-07-19 — End: 2023-07-19
  Administered 2023-07-19: 1000 ug via INTRAMUSCULAR

## 2023-07-19 NOTE — Progress Notes (Signed)
Patient here for his B12 injection given in his left deltoid. Patient tolerated well.

## 2023-08-18 ENCOUNTER — Ambulatory Visit: Payer: Medicare Other | Admitting: Radiology

## 2023-08-18 DIAGNOSIS — Z23 Encounter for immunization: Secondary | ICD-10-CM | POA: Diagnosis not present

## 2023-08-18 DIAGNOSIS — E538 Deficiency of other specified B group vitamins: Secondary | ICD-10-CM

## 2023-08-18 MED ORDER — CYANOCOBALAMIN 1000 MCG/ML IJ SOLN
1000.0000 ug | Freq: Once | INTRAMUSCULAR | Status: AC
Start: 2023-08-18 — End: 2023-08-18
  Administered 2023-08-18: 1000 ug via INTRAMUSCULAR

## 2023-08-18 NOTE — Progress Notes (Signed)
Patient here for Flu and b12 injections. Patient tolerated well with no complications.

## 2023-08-19 ENCOUNTER — Ambulatory Visit: Payer: Medicare Other | Admitting: Internal Medicine

## 2023-09-20 ENCOUNTER — Ambulatory Visit (INDEPENDENT_AMBULATORY_CARE_PROVIDER_SITE_OTHER): Payer: Medicare Other

## 2023-09-20 VITALS — BP 138/70 | HR 68 | Ht 67.0 in | Wt 148.0 lb

## 2023-09-20 DIAGNOSIS — Z0000 Encounter for general adult medical examination without abnormal findings: Secondary | ICD-10-CM | POA: Diagnosis not present

## 2023-09-20 DIAGNOSIS — E538 Deficiency of other specified B group vitamins: Secondary | ICD-10-CM

## 2023-09-20 MED ORDER — CYANOCOBALAMIN 1000 MCG/ML IJ SOLN
1000.0000 ug | Freq: Once | INTRAMUSCULAR | Status: AC
Start: 2023-09-20 — End: 2023-09-20
  Administered 2023-09-20: 1000 ug via INTRAMUSCULAR

## 2023-09-20 NOTE — Patient Instructions (Signed)
Mr. Palubicki , Thank you for taking time to come for your Medicare Wellness Visit. I appreciate your ongoing commitment to your health goals. Please review the following plan we discussed and let me know if I can assist you in the future.   Referrals/Orders/Follow-Ups/Clinician Recommendations: It was a pleasure meeting and talking with you today.  Keep up the good work.    This is a list of the screening recommended for you and due dates:  Health Maintenance  Topic Date Due   Zoster (Shingles) Vaccine (2 of 2) 09/21/2022   Hemoglobin A1C  09/23/2023   Eye exam for diabetics  12/15/2023   Complete foot exam   12/24/2023   Medicare Annual Wellness Visit  09/19/2024   DTaP/Tdap/Td vaccine (3 - Td or Tdap) 10/08/2027   Pneumonia Vaccine  Completed   Flu Shot  Completed   HPV Vaccine  Aged Out   COVID-19 Vaccine  Discontinued    Advanced directives: (Copy Requested) Please bring a copy of your health care power of attorney and living will to the office to be added to your chart at your convenience.  Next Medicare Annual Wellness Visit scheduled for next year: Yes

## 2023-09-20 NOTE — Progress Notes (Signed)
After obtaining consent, and per orders of Dr. Ronnald Ramp, injection of B12 given by Marrian Salvage. Patient instructed to report any adverse reaction to me immediately.

## 2023-09-20 NOTE — Progress Notes (Signed)
Subjective:   Andre Mills is a 89 y.o. male who presents for Medicare Annual/Subsequent preventive examination.  Visit Complete: In person  Cardiac Risk Factors include: advanced age (>9men, >51 women);male gender;hypertension;diabetes mellitus;Other (see comment);dyslipidemia, Risk factor comments: CKD     Objective:    Today's Vitals   09/20/23 0921  BP: 138/70  Pulse: 68  Weight: 148 lb (67.1 kg)  Height: 5\' 7"  (1.702 m)   Body mass index is 23.18 kg/m.     09/20/2023    9:26 AM 07/21/2022   10:02 AM 07/19/2021   10:48 AM 06/22/2014    9:35 AM  Advanced Directives  Does Patient Have a Medical Advance Directive? Yes Yes Yes Yes  Type of Estate agent of [place];Living will Healthcare Power of [place];Living will Healthcare Power of [place];Living will Healthcare Power of [place];Living will  Does patient want to make changes to medical advance directive?   No - Patient declined No - Patient declined  Copy of Healthcare Power of Attorney in Chart? No - copy requested No - copy requested No - copy requested No - copy requested    Current Medications (verified) Outpatient Encounter Medications as of 09/20/2023  Medication Sig   Alogliptin Benzoate 25 MG TABS Take 25 mg by mouth daily. Take one half tablet   calcium-vitamin D (OSCAL WITH D) 500-5 MG-MCG tablet Take 1 tablet by mouth daily.   ferrous sulfate 325 (65 FE) MG tablet Take 325 mg by mouth daily with breakfast.   gabapentin (NEURONTIN) 300 MG capsule Take 300 mg by mouth 2 (two) times daily.   ketoconazole (NIZORAL) 2 % cream Apply 1 application topically 2 (two) times daily.   metFORMIN (GLUCOPHAGE) 500 MG tablet Take 1 tablet (500 mg total) by mouth 2 (two) times daily with a meal.   omeprazole (PRILOSEC) 20 MG capsule Take 20 mg by mouth daily.   rosuvastatin (CRESTOR) 20 MG tablet Take 20 mg by mouth daily.   tamsulosin (FLOMAX) 0.4 MG CAPS capsule Take 0.4 mg by mouth daily.    timolol (BLOCADREN) 5 MG tablet Take 5 mg by mouth 2 (two) times daily.   Blood Glucose Monitoring Suppl (ONETOUCH VERIO FLEX SYSTEM) w/Device KIT Use to check blood sugar once a day. E11.9 (Patient not taking: Reported on 09/20/2023)   glucose blood (ONETOUCH VERIO) test strip Use as instructed to check blood sugar once a day. E11.9 (Patient not taking: Reported on 09/20/2023)   Insulin Glargine (BASAGLAR KWIKPEN) 100 UNIT/ML Inject 20 Units into the skin daily. (Patient not taking: Reported on 09/20/2023)   Insulin Pen Needle 32G X 6 MM MISC 1 Act by Does not apply route daily. (Patient not taking: Reported on 09/20/2023)   Lancets (ONETOUCH DELICA PLUS LANCET33G) MISC 1 Act by Does not apply route daily. E11.9 (Patient not taking: Reported on 09/20/2023)   Facility-Administered Encounter Medications as of 09/20/2023  Medication   cyanocobalamin ((VITAMIN B-12)) injection 1,000 mcg   [COMPLETED] cyanocobalamin (VITAMIN B12) injection 1,000 mcg    Allergies (verified) Flexeril [cyclobenzaprine], Limbitrol ds [chlordiazepoxide-amitriptyline], Macrodantin [nitrofurantoin macrocrystal], Magnesium oxide, Mobic [meloxicam], and Septra [sulfamethoxazole-trimethoprim]   History: Past Medical History:  Diagnosis Date   Anemia    Arthritis    Diabetes mellitus without complication (HCC)    GERD (gastroesophageal reflux disease)    High cholesterol    Hypertension    Wears dentures    TOP   Wears glasses    Past Surgical History:  Procedure Laterality Date  COLONOSCOPY     COLONOSCOPY W/ BIOPSIES AND POLYPECTOMY     EYE SURGERY     both cataracts   KNEE ARTHROSCOPY     left   TONSILLECTOMY     TRIGGER FINGER RELEASE Right 06/28/2014   Procedure: RELEASE A-1 PULLEY POSSIBLE EXCISION ONE SLIP SUPERFICIALIS  RIGHT MIDDLE FINGER ;  Surgeon: Cindee Salt, MD;  Location: [facility];  Service: Orthopedics;  Laterality: Right;   History reviewed. No pertinent family  history. Social History   Socioeconomic History   Marital status: Married    Spouse name: Kathie Rhodes   Number of children: 3   Years of education: Not on file   Highest education level: Not on file  Occupational History   Occupation: Retired  Tobacco Use   Smoking status: Former    Current packs/day: 0.00    Types: Cigarettes    Quit date: 06/23/1959    Years since quitting: 64.2   Smokeless tobacco: Never  Vaping Use   Vaping status: Never Used  Substance and Sexual Activity   Alcohol use: No   Drug use: No   Sexual activity: Not on file  Other Topics Concern   Not on file  Social History Narrative   Lives with wife in [place], one level.   Social Determinants of Health   Financial Resource Strain: Low Risk  (09/20/2023)   Overall Financial Resource Strain (CARDIA)    Difficulty of Paying Living Expenses: Not hard at all  Food Insecurity: No Food Insecurity (09/20/2023)   Hunger Vital Sign    Worried About Running Out of Food in the Last Year: Never true    Ran Out of Food in the Last Year: Never true  Transportation Needs: No Transportation Needs (09/20/2023)   PRAPARE - Administrator, Civil Service (Medical): No    Lack of Transportation (Non-Medical): No  Physical Activity: Insufficiently Active (09/20/2023)   Exercise Vital Sign    Days of Exercise per Week: 5 days    Minutes of Exercise per Session: 10 min  Stress: No Stress Concern Present (09/20/2023)   [employer] of Occupational Health - Occupational Stress Questionnaire    Feeling of Stress : Only a little  Social Connections: Moderately Integrated (09/20/2023)   Social Connection and Isolation Panel [NHANES]    Frequency of Communication with Friends and Family: Three times a week    Frequency of Social Gatherings with Friends and Family: More than three times a week    Attends Religious Services: More than 4 times per year    Active Member of [employer] or Organizations: No    Attends Probation officer: Never    Marital Status: Married    Tobacco Counseling Counseling given: Not Answered   Clinical Intake:  Pre-visit preparation completed: Yes  Pain : No/denies pain     BMI - recorded: 23.18 Nutritional Status: BMI of 19-24  Normal Nutritional Risks: None Diabetes: Yes CBG done?: No Did pt. bring in CBG monitor from home?: No  How often do you need to have someone help you when you read instructions, pamphlets, or other written materials from your doctor or pharmacy?: 1 - Never  Interpreter Needed?: No  Information entered by :: Crimson Beer, RMA   Activities of Daily Living    09/20/2023    9:31 AM  In your present state of health, do you have any difficulty performing the following activities:  Hearing? 1  Vision? 0  Difficulty concentrating  or making decisions? 0  Walking or climbing stairs? 1  Dressing or bathing? 0  Doing errands, shopping? 0  Preparing Food and eating ? N  Using the Toilet? N  In the past six months, have you accidently leaked urine? N  Do you have problems with loss of bowel control? N  Managing your Medications? N  Managing your Finances? N  Housekeeping or managing your Housekeeping? N    Patient Care Team: Etta Grandchild, MD as PCP - General (Internal Medicine) Jimmey Ralph Forest Becker, DO as Consulting Physician (Ophthalmology)  Indicate any recent Medical Services you may have received from other than [facility] providers in the past year (date may be approximate).     Assessment:   This is a routine wellness examination for Deep.  Hearing/Vision screen Hearing Screening - Comments:: Wears hearing aides Vision Screening - Comments:: Denies vision issues/cataracts surgery   Goals Addressed             This Visit's Progress    To maintain my current health status by continuing to eat healthy, stay physically active and socially active.   On track     Depression Screen    09/20/2023    9:30 AM  03/23/2023   10:08 AM 12/23/2022   10:06 AM 10/23/2022    9:54 AM 07/21/2022   10:01 AM 01/21/2022   11:35 AM 07/19/2021   10:39 AM  PHQ 2/9 Scores  PHQ - 2 Score 0 0 0 0 0 0 0  PHQ- 9 Score 0          Fall Risk    09/20/2023    9:26 AM 03/23/2023   10:08 AM 12/23/2022   10:06 AM 10/23/2022    9:54 AM 07/21/2022   10:03 AM  Fall Risk   Falls in the past year? 0 0 0 1 0  Number falls in past yr: 0 0 0 0 0  Injury with Fall? 0 0 0 1 0  Risk for fall due to : No Fall Risks No Fall Risks No Fall Risks Impaired balance/gait No Fall Risks  Follow up Falls evaluation completed;Falls prevention discussed Falls evaluation completed Falls evaluation completed Falls evaluation completed Falls prevention discussed    MEDICARE RISK AT HOME: Medicare Risk at Home Any stairs in or around the home?: No Home free of loose throw rugs in walkways, pet beds, electrical cords, etc?: Yes Adequate lighting in your home to reduce risk of falls?: Yes Life alert?: No Use of a cane, walker or w/c?: Yes Grab bars in the bathroom?: No Shower chair or bench in shower?: No Elevated toilet seat or a handicapped toilet?: No  TIMED UP AND GO:  Was the test performed?  Yes  Length of time to ambulate 10 feet: 25 sec Gait slow and steady with assistive device    Cognitive Function:        09/20/2023    9:26 AM 07/21/2022   10:05 AM 07/19/2021   10:46 AM  6CIT Screen  What Year? 0 points 0 points 0 points  What month? 0 points 0 points 0 points  What time? 0 points 0 points 0 points  Count back from 20 0 points 0 points 0 points  Months in reverse 0 points 0 points 4 points  Repeat phrase 0 points 0 points 10 points  Total Score 0 points 0 points 14 points    Immunizations Immunization History  Administered Date(s) Administered   Fluad Quad(high Dose 65+) 08/15/2020, 09/05/2021,  07/21/2022   Fluad Trivalent(High Dose 65+) 08/18/2023   Influenza Split 09/13/2017   Influenza-Unspecified 07/10/2022    Moderna Covid-19 Fall Seasonal Vaccine 91yrs & older 11/03/2022   Moderna Covid-19 Vaccine Bivalent Booster 67yrs & up 10/06/2021   Moderna Sars-Covid-2 Vaccination 11/29/2019, 12/27/2019, 10/06/2020, 03/11/2021, 04/08/2021   Pneumococcal Conjugate-13 07/17/2014   Pneumococcal Polysaccharide-23 10/27/2000, 10/21/2018   Tdap 10/08/2015, 10/07/2017   Zoster Recombinant(Shingrix) 07/27/2022    TDAP status: Up to date  Flu Vaccine status: Up to date  Pneumococcal vaccine status: Up to date  Covid-19 vaccine status: Completed vaccines  Qualifies for Shingles Vaccine? Yes   Zostavax completed Yes   Shingrix Completed?: No.    Education has been provided regarding the importance of this vaccine. Patient has been advised to call insurance company to determine out of pocket expense if they have not yet received this vaccine. Advised may also receive vaccine at local pharmacy or Health Dept. Verbalized acceptance and understanding.  Screening Tests Health Maintenance  Topic Date Due   Zoster Vaccines- Shingrix (2 of 2) 09/21/2022   HEMOGLOBIN A1C  09/23/2023   OPHTHALMOLOGY EXAM  12/15/2023   FOOT EXAM  12/24/2023   Medicare Annual Wellness (AWV)  09/19/2024   DTaP/Tdap/Td (3 - Td or Tdap) 10/08/2027   Pneumonia Vaccine 9+ Years old  Completed   INFLUENZA VACCINE  Completed   HPV VACCINES  Aged Out   COVID-19 Vaccine  Discontinued    Health Maintenance  Health Maintenance Due  Topic Date Due   Zoster Vaccines- Shingrix (2 of 2) 09/21/2022    Colorectal cancer screening: No longer required.   Lung Cancer Screening: (Low Dose CT Chest recommended if Age 39-80 years, 20 pack-year currently smoking OR have quit w/in 15years.) does not qualify.   Lung Cancer Screening Referral: N/A  Additional Screening:  Hepatitis C Screening: does not qualify;   Vision Screening: Recommended annual ophthalmology exams for early detection of glaucoma and other disorders of the eye. Is the  patient up to date with their annual eye exam?  Yes  Who is the provider or what [facility] the [facility] If pt is not established with a provider, would they like to be referred to a provider to establish care? No .   Dental Screening: Recommended annual dental exams for proper oral hygiene  Diabetic Foot Exam: Diabetic Foot Exam: Completed 12/23/2022  Community Resource Referral / Chronic Care Management: CRR required this visit?  No   CCM required this visit?  No     Plan:     I have personally reviewed and noted the following in the patient's chart:   Medical and social history Use of alcohol, tobacco or illicit drugs  Current medications and supplements including opioid prescriptions. Patient is not currently taking opioid prescriptions. Functional ability and status Nutritional status Physical activity Advanced directives List of other physicians Hospitalizations, surgeries, and ER visits in previous 12 months Vitals Screenings to include cognitive, depression, and falls Referrals and appointments  In addition, I have reviewed and discussed with patient certain preventive protocols, quality metrics, and best practice recommendations. A written personalized care plan for preventive services as well as general preventive health recommendations were provided to patient.     Kinzie Wickes L Belem Hintze, CMA   09/20/2023   After Visit Summary: (MyChart) Due to this being a telephonic visit, the after visit summary with patients personalized plan was offered to patient via MyChart  Nurse Notes: Patient is up to date on all health maintenance.  He does have concerns in regards to a rash, (redness, denies any pain or itchiness) in her groin area, which has been there for over a year now.  Patient states that he is seeing Dr. Donzetta Starch about it but wanted Dr. Sanda Linger to be aware of it.  Patient also stated that he is having  constipation due to iron pill he currently taking.  He explains that he has been taking over the counter Colace everyday but still has the constipation.  He would like to know if Dr. Yetta Barre is going to check his iron levels to see if he has to continue to take the iron pill daily, during his appointment tomorrow with Dr. Yetta Barre.  Patient had no othe concerns to address today.

## 2023-09-21 ENCOUNTER — Encounter: Payer: Self-pay | Admitting: Internal Medicine

## 2023-09-21 ENCOUNTER — Ambulatory Visit: Payer: Medicare Other | Admitting: Internal Medicine

## 2023-09-21 VITALS — BP 144/60 | HR 67 | Temp 97.5°F | Ht 67.0 in | Wt 148.0 lb

## 2023-09-21 DIAGNOSIS — D508 Other iron deficiency anemias: Secondary | ICD-10-CM | POA: Diagnosis not present

## 2023-09-21 DIAGNOSIS — L233 Allergic contact dermatitis due to drugs in contact with skin: Secondary | ICD-10-CM

## 2023-09-21 DIAGNOSIS — N1832 Chronic kidney disease, stage 3b: Secondary | ICD-10-CM

## 2023-09-21 DIAGNOSIS — I10 Essential (primary) hypertension: Secondary | ICD-10-CM

## 2023-09-21 DIAGNOSIS — D510 Vitamin B12 deficiency anemia due to intrinsic factor deficiency: Secondary | ICD-10-CM

## 2023-09-21 DIAGNOSIS — E1122 Type 2 diabetes mellitus with diabetic chronic kidney disease: Secondary | ICD-10-CM | POA: Diagnosis not present

## 2023-09-21 DIAGNOSIS — E785 Hyperlipidemia, unspecified: Secondary | ICD-10-CM | POA: Diagnosis not present

## 2023-09-21 LAB — IBC + FERRITIN
Ferritin: 30.6 ng/mL (ref 22.0–322.0)
Iron: 69 ug/dL (ref 42–165)
Saturation Ratios: 17.9 % — ABNORMAL LOW (ref 20.0–50.0)
TIBC: 385 ug/dL (ref 250.0–450.0)
Transferrin: 275 mg/dL (ref 212.0–360.0)

## 2023-09-21 LAB — URINALYSIS, ROUTINE W REFLEX MICROSCOPIC
Bilirubin Urine: NEGATIVE
Hgb urine dipstick: NEGATIVE
Leukocytes,Ua: NEGATIVE
Nitrite: NEGATIVE
Specific Gravity, Urine: 1.02 (ref 1.000–1.030)
Total Protein, Urine: NEGATIVE
Urine Glucose: 250 — AB
Urobilinogen, UA: 0.2 (ref 0.0–1.0)
pH: 6 (ref 5.0–8.0)

## 2023-09-21 LAB — CBC WITH DIFFERENTIAL/PLATELET
Basophils Absolute: 0 10*3/uL (ref 0.0–0.1)
Basophils Relative: 0.8 % (ref 0.0–3.0)
Eosinophils Absolute: 0.5 10*3/uL (ref 0.0–0.7)
Eosinophils Relative: 10.1 % — ABNORMAL HIGH (ref 0.0–5.0)
HCT: 33.6 % — ABNORMAL LOW (ref 39.0–52.0)
Hemoglobin: 11.4 g/dL — ABNORMAL LOW (ref 13.0–17.0)
Lymphocytes Relative: 34.8 % (ref 12.0–46.0)
Lymphs Abs: 1.9 10*3/uL (ref 0.7–4.0)
MCHC: 33.9 g/dL (ref 30.0–36.0)
MCV: 96.3 fL (ref 78.0–100.0)
Monocytes Absolute: 0.5 10*3/uL (ref 0.1–1.0)
Monocytes Relative: 8.8 % (ref 3.0–12.0)
Neutro Abs: 2.4 10*3/uL (ref 1.4–7.7)
Neutrophils Relative %: 45.5 % (ref 43.0–77.0)
Platelets: 169 10*3/uL (ref 150.0–400.0)
RBC: 3.49 Mil/uL — ABNORMAL LOW (ref 4.22–5.81)
RDW: 15.2 % (ref 11.5–15.5)
WBC: 5.4 10*3/uL (ref 4.0–10.5)

## 2023-09-21 LAB — BASIC METABOLIC PANEL
BUN: 24 mg/dL — ABNORMAL HIGH (ref 6–23)
CO2: 26 meq/L (ref 19–32)
Calcium: 8.9 mg/dL (ref 8.4–10.5)
Chloride: 105 meq/L (ref 96–112)
Creatinine, Ser: 1.05 mg/dL (ref 0.40–1.50)
GFR: 59.93 mL/min — ABNORMAL LOW (ref >60.00)
Glucose, Bld: 148 mg/dL — ABNORMAL HIGH (ref 70–99)
Potassium: 4.3 meq/L (ref 3.5–5.1)
Sodium: 138 meq/L (ref 135–145)

## 2023-09-21 LAB — LIPID PANEL
Cholesterol: 98 mg/dL (ref 0–200)
HDL: 41.4 mg/dL (ref >39.00)
LDL Cholesterol: 35 mg/dL (ref 0–99)
NonHDL: 56.39
Total CHOL/HDL Ratio: 2
Triglycerides: 108 mg/dL (ref 0.0–149.0)
VLDL: 21.6 mg/dL (ref 0.0–40.0)

## 2023-09-21 LAB — MICROALBUMIN / CREATININE URINE RATIO
Creatinine,U: 127.3 mg/dL
Microalb Creat Ratio: 2.1 mg/g (ref 0.0–30.0)
Microalb, Ur: 2.7 mg/dL — ABNORMAL HIGH (ref 0.0–1.9)

## 2023-09-21 LAB — TSH: TSH: 2.89 u[IU]/mL (ref 0.35–5.50)

## 2023-09-21 LAB — HEMOGLOBIN A1C: Hgb A1c MFr Bld: 7.1 % — ABNORMAL HIGH (ref 4.6–6.5)

## 2023-09-21 MED ORDER — TRIAMCINOLONE ACETONIDE 0.5 % EX CREA: 1.0000 | TOPICAL_CREAM | Freq: Three times a day (TID) | CUTANEOUS | 1 refills | Status: AC

## 2023-09-21 NOTE — Patient Instructions (Signed)

## 2023-09-21 NOTE — Progress Notes (Unsigned)
Subjective:  Patient ID: Andre Mills, male    DOB: Apr 16, 1925  Age: 89 y.o. MRN: [id number]  CC: Anemia, Hypertension, Diabetes, and Hyperlipidemia   HPI Andre Mills presents for f/up --  Discussed the use of AI scribe software for clinical note transcription with the patient, who gave verbal consent to proceed.  History of Present Illness   Andre Mills, a patient with a history of dermatological issues, presents with a persistent rash in the groin area. The rash, which is described as red and itchy, has been present for an unspecified duration. Despite attempts to manage the condition with serum blue, as suggested by a previous healthcare provider, the rash has not improved. The patient denies any associated symptoms such as chest pain, shortness of breath, dizziness, or lightheadedness. The patient had undergone unspecified tests at the [facility] on October 16th, but the results were not available at the time of the consultation.       Andre Mills, Andre Mills, CMA  Etta Grandchild, MD Nurse Notes: Patient is up to date on all health maintenance.  He does have concerns in regards to a rash, (redness, denies any pain or itchiness) in her groin area, which has been there for over a year now.  Patient states that he is seeing Dr. Donzetta Starch about it but wanted Dr. Sanda Linger to be aware of it.  Patient also stated that he is having constipation due to iron pill he currently taking.  He explains that he has been taking over the counter Colace everyday but still has the constipation.  He would like to know if Dr. Yetta Barre is going to check his iron levels to see if he has to continue to take the iron pill daily, during his appointment tomorrow with Dr. Yetta Barre.  Patient had no othe concerns to address today.   Outpatient Medications Prior to Visit  Medication Sig Dispense Refill   calcium-vitamin D (OSCAL WITH D) 500-5 MG-MCG tablet Take 1 tablet by mouth daily.     ferrous sulfate 325 (65 FE) MG tablet Take  325 mg by mouth daily with breakfast.     gabapentin (NEURONTIN) 300 MG capsule Take 300 mg by mouth 2 (two) times daily.     ketoconazole (NIZORAL) 2 % cream Apply 1 application topically 2 (two) times daily. 60 g 2   metFORMIN (GLUCOPHAGE) 500 MG tablet Take 1 tablet (500 mg total) by mouth 2 (two) times daily with a meal. 180 tablet 3   omeprazole (PRILOSEC) 20 MG capsule Take 20 mg by mouth daily.     rosuvastatin (CRESTOR) 20 MG tablet Take 20 mg by mouth daily.     sitaGLIPtin (JANUVIA) 100 MG tablet Take 100 mg by mouth daily.     tamsulosin (FLOMAX) 0.4 MG CAPS capsule Take 0.4 mg by mouth daily.     timolol (BLOCADREN) 5 MG tablet Take 5 mg by mouth 2 (two) times daily.     Blood Glucose Monitoring Suppl (ONETOUCH VERIO FLEX SYSTEM) w/Device KIT Use to check blood sugar once a day. E11.9 (Patient not taking: Reported on 09/20/2023) 1 kit 0   glucose blood (ONETOUCH VERIO) test strip Use as instructed to check blood sugar once a day. E11.9 (Patient not taking: Reported on 09/20/2023) 100 each 5   Insulin Glargine (BASAGLAR KWIKPEN) 100 UNIT/ML Inject 20 Units into the skin daily. (Patient not taking: Reported on 09/20/2023) 18 mL 1   Insulin Pen Needle 32G X 6 MM MISC 1  Act by Does not apply route daily. (Patient not taking: Reported on 09/20/2023) 100 each 1   Lancets (ONETOUCH DELICA PLUS LANCET33G) MISC 1 Act by Does not apply route daily. E11.9 (Patient not taking: Reported on 09/20/2023) 100 each 5   Alogliptin Benzoate 25 MG TABS Take 25 mg by mouth daily. Take one half tablet     Facility-Administered Medications Prior to Visit  Medication Dose Route Frequency Provider Last Rate Last Admin   cyanocobalamin ((VITAMIN B-12)) injection 1,000 mcg  1,000 mcg Intramuscular Q30 days Etta Grandchild, MD   1,000 mcg at 02/08/23 0859    ROS Review of Systems  Constitutional:  Negative for appetite change, chills, diaphoresis and fatigue.  HENT: Negative.    Eyes:  Negative for visual  disturbance.  Respiratory: Negative.  Negative for chest tightness, shortness of breath and wheezing.   Cardiovascular:  Negative for chest pain, palpitations and leg swelling.  Gastrointestinal:  Negative for abdominal pain, blood in stool, constipation, diarrhea and vomiting.  Endocrine: Negative.   Genitourinary: Negative.  Negative for difficulty urinating.  Musculoskeletal:  Negative for arthralgias, joint swelling and myalgias.  Skin:  Positive for color change and rash.       ++ groin rash. He has been taking diflucan, using OTC hydrocortisone cream, and blue emu on the area.  Neurological: Negative.  Negative for dizziness and weakness.  Hematological:  Negative for adenopathy. Does not bruise/bleed easily.  Psychiatric/Behavioral:  Positive for confusion and decreased concentration.     Objective:  BP (!) 144/60 (BP Location: Left Arm, Patient Position: Sitting, Cuff Size: Normal)   Pulse 67   Temp (!) 97.5 F (36.4 C) (Oral)   Ht 5\' 7"  (1.702 m)   Wt 148 lb (67.1 kg)   SpO2 94%   BMI 23.18 kg/m   BP Readings from Last 3 Encounters:  09/21/23 (!) 144/60  09/20/23 138/70  03/23/23 (!) 152/68    Wt Readings from Last 3 Encounters:  09/21/23 148 lb (67.1 kg)  09/20/23 148 lb (67.1 kg)  03/23/23 148 lb (67.1 kg)    Physical Exam Vitals reviewed.  Constitutional:      Appearance: Normal appearance.  HENT:     Mouth/Throat:     Mouth: Mucous membranes are moist.  Eyes:     General: No scleral icterus.    Conjunctiva/sclera: Conjunctivae normal.  Cardiovascular:     Rate and Rhythm: Normal rate and regular rhythm.     Heart sounds: No murmur heard.    No gallop.  Pulmonary:     Effort: Pulmonary effort is normal.     Breath sounds: No stridor. No wheezing, rhonchi or rales.  Abdominal:     General: Abdomen is flat.     Palpations: There is no mass.     Tenderness: There is no abdominal tenderness. There is no guarding.     Hernia: No hernia is present.   Musculoskeletal:        General: Normal range of motion.     Cervical back: Neck supple.     Right lower leg: No edema.     Left lower leg: No edema.  Lymphadenopathy:     Cervical: No cervical adenopathy.  Skin:    Coloration: Skin is not jaundiced.     Findings: Rash present.  Neurological:     Mental Status: He is alert. Mental status is at baseline.  Psychiatric:        Mood and Affect: Mood normal.  Behavior: Behavior normal.     Lab Results  Component Value Date   WBC 5.4 09/21/2023   HGB 11.4 (L) 09/21/2023   HCT 33.6 (L) 09/21/2023   PLT 169.0 09/21/2023   GLUCOSE 148 (H) 09/21/2023   CHOL 98 09/21/2023   TRIG 108.0 09/21/2023   HDL 41.40 09/21/2023   LDLCALC 35 09/21/2023   ALT 11 10/23/2022   AST 15 10/23/2022   NA 138 09/21/2023   K 4.3 09/21/2023   CL 105 09/21/2023   CREATININE 1.05 09/21/2023   BUN 24 (H) 09/21/2023   CO2 26 09/21/2023   TSH 2.89 09/21/2023   INR 1.1 05/14/2009   HGBA1C 7.1 (H) 09/21/2023   MICROALBUR 2.7 (H) 09/21/2023    DG Ribs Bilateral W/Chest  Result Date: 11/11/2016 CLINICAL DATA:  Larey Seat several days ago with anterior chest pain particularly right-sided, former smoking history EXAM: BILATERAL RIBS AND CHEST - 4+ VIEW COMPARISON:  Chest x-ray of 05/14/2009 FINDINGS: No active infiltrate or effusion is seen. No pneumothorax is noted. Mediastinal and hilar contours are unremarkable. The heart is minimally prominent. Bilateral rib detail film were obtained. There is very slight irregularity of the very anterior right seventh rib which could represent a nondisplaced fracture. On the left there appears to be an old fracture involving the left seventh rib anteriorly and laterally with thickening of the cortex. No acute left rib fracture is seen. IMPRESSION: 1. Possible nondisplaced fracture of the anterior right seventh rib. 2. Probable old fracture of the left anterolateral seventh rib. 3. No active lung disease. Electronically Signed    By: Dwyane Dee M.D.   On: 11/11/2016 11:43    Assessment & Plan:   Stage 3b chronic kidney disease (HCC)- His renal function is stable. -     Microalbumin / creatinine urine ratio; Future -     Urinalysis, Routine w reflex microscopic; Future -     Basic metabolic panel; Future  Type 2 diabetes mellitus with stage 3b chronic kidney disease, without long-term current use of insulin (HCC)- His blood sugar is adequately well-controlled. -     Microalbumin / creatinine urine ratio; Future -     Urinalysis, Routine w reflex microscopic; Future -     Hemoglobin A1c; Future -     Basic metabolic panel; Future  Vitamin B12 deficiency anemia due to intrinsic factor deficiency- H&H have improved. -     CBC with Differential/Platelet; Future  Primary hypertension- His blood pressure is adequately well-controlled. -     TSH; Future -     Urinalysis, Routine w reflex microscopic; Future  Dyslipidemia, goal LDL below 100 - LDL goal achieved. Doing well on the statin  -     Lipid panel; Future -     TSH; Future  Iron deficiency anemia secondary to inadequate dietary iron intake- H&H have improved. -     CBC with Differential/Platelet; Future -     IBC + Ferritin; Future  Allergic contact dermatitis due to drugs in contact with skin -     Triamcinolone Acetonide; Apply 1 Application topically 3 (three) times daily.  Dispense: 30 g; Refill: 1     Follow-up: Return in about 3 months (around 12/22/2023).  Sanda Linger, MD

## 2023-10-22 ENCOUNTER — Ambulatory Visit: Payer: Medicare Other

## 2023-10-27 ENCOUNTER — Ambulatory Visit (INDEPENDENT_AMBULATORY_CARE_PROVIDER_SITE_OTHER): Payer: Medicare Other

## 2023-10-27 DIAGNOSIS — E538 Deficiency of other specified B group vitamins: Secondary | ICD-10-CM

## 2023-10-27 MED ORDER — CYANOCOBALAMIN 1000 MCG/ML IJ SOLN
1000.0000 ug | Freq: Once | INTRAMUSCULAR | Status: AC
  Administered 2023-10-27: 1000 ug via INTRAMUSCULAR

## 2023-10-27 NOTE — Progress Notes (Signed)
Per orders of Andre Grandchild, MD, injection of B-12 given by Dorris Fetch in left deltoid. Patient tolerated injection well. Patient will make appointment for 1 month.

## 2023-11-29 ENCOUNTER — Ambulatory Visit (INDEPENDENT_AMBULATORY_CARE_PROVIDER_SITE_OTHER): Payer: Medicare Other

## 2023-11-29 ENCOUNTER — Ambulatory Visit: Payer: Medicare Other

## 2023-11-29 DIAGNOSIS — E538 Deficiency of other specified B group vitamins: Secondary | ICD-10-CM | POA: Diagnosis not present

## 2023-11-29 MED ORDER — CYANOCOBALAMIN 1000 MCG/ML IJ SOLN
1000.0000 ug | Freq: Once | INTRAMUSCULAR | Status: AC
  Administered 2023-11-29: 1000 ug via INTRAMUSCULAR

## 2023-11-29 NOTE — Progress Notes (Signed)
After obtaining consent, and per orders of Dr. Ronnald Ramp, injection of B12 given by Marrian Salvage. Patient instructed to report any adverse reaction to me immediately.

## 2023-12-06 DIAGNOSIS — L308 Other specified dermatitis: Secondary | ICD-10-CM | POA: Diagnosis not present

## 2023-12-06 DIAGNOSIS — Z85828 Personal history of other malignant neoplasm of skin: Secondary | ICD-10-CM | POA: Diagnosis not present

## 2023-12-22 ENCOUNTER — Ambulatory Visit: Payer: Medicare Other | Admitting: Internal Medicine

## 2023-12-24 ENCOUNTER — Encounter: Payer: Self-pay | Admitting: Internal Medicine

## 2023-12-24 ENCOUNTER — Ambulatory Visit: Payer: Medicare Other

## 2023-12-24 ENCOUNTER — Ambulatory Visit (INDEPENDENT_AMBULATORY_CARE_PROVIDER_SITE_OTHER): Payer: Medicare Other | Admitting: Internal Medicine

## 2023-12-24 ENCOUNTER — Telehealth: Payer: Self-pay | Admitting: Internal Medicine

## 2023-12-24 VITALS — BP 140/80 | HR 67 | Temp 97.6°F | Ht 67.0 in | Wt 149.0 lb

## 2023-12-24 DIAGNOSIS — M542 Cervicalgia: Secondary | ICD-10-CM | POA: Diagnosis not present

## 2023-12-24 DIAGNOSIS — I10 Essential (primary) hypertension: Secondary | ICD-10-CM

## 2023-12-24 DIAGNOSIS — M47812 Spondylosis without myelopathy or radiculopathy, cervical region: Secondary | ICD-10-CM | POA: Diagnosis not present

## 2023-12-24 DIAGNOSIS — R001 Bradycardia, unspecified: Secondary | ICD-10-CM

## 2023-12-24 DIAGNOSIS — M4802 Spinal stenosis, cervical region: Secondary | ICD-10-CM | POA: Diagnosis not present

## 2023-12-24 DIAGNOSIS — M50221 Other cervical disc displacement at C4-C5 level: Secondary | ICD-10-CM | POA: Diagnosis not present

## 2023-12-24 DIAGNOSIS — M5030 Other cervical disc degeneration, unspecified cervical region: Secondary | ICD-10-CM | POA: Diagnosis not present

## 2023-12-24 NOTE — Telephone Encounter (Signed)
Patient daughter called in stating that patient is having issues with his neck he would like to be seen today if possible he Is having pain in his   Andre Mills [phone] she would like a call back regarding this

## 2023-12-24 NOTE — Patient Instructions (Signed)
Bradycardia, Adult Bradycardia is a slower-than-normal heartbeat. A normal resting heart rate for an adult ranges from 60 to 100 beats per minute. With bradycardia, the resting heart rate is less than 60 beats per minute. Bradycardia can prevent enough oxygen from reaching certain areas of your body when you are active. It can be serious if it keeps enough oxygen from reaching your brain and other parts of your body. Bradycardia is not a problem for everyone. For some healthy adults, a slow resting heart rate is normal. What are the causes? This condition may be caused by: A problem with the heart, including: A problem with the heart's electrical system, such as a heart block. With a heart block, electrical signals between the chambers of the heart are partially or completely blocked, so they are not able to work as they should. A problem with the heart's natural pacemaker (sinus node). Heart disease. A heart attack. Heart damage. Lyme disease. A heart infection. A heart condition that is present at birth (congenital heart defect). Certain medicines that treat heart conditions. Certain conditions, such as hypothyroidism and obstructive sleep apnea. Problems with the balance of chemicals and other substances, like potassium, in the blood. Trauma. Radiation therapy. What increases the risk? You are more likely to develop this condition if you: Are age 88 or older. Have high blood pressure (hypertension), high cholesterol (hyperlipidemia), or diabetes. Drink heavily, use tobacco or nicotine products, or use drugs. What are the signs or symptoms? Symptoms of this condition include: Light-headedness. Feeling faint or fainting. Fatigue and weakness. Trouble with activity or exercise. Shortness of breath. Chest pain (angina). Drowsiness. Confusion. Dizziness. How is this diagnosed? This condition may be diagnosed based on: Your symptoms. Your medical history. A physical exam. During  the exam, your health care provider will listen to your heartbeat and check your pulse. To confirm the diagnosis, your health care provider may order tests, such as: Blood tests. An electrocardiogram (ECG). This test records the heart's electrical activity. The test can show how fast your heart is beating and whether the heartbeat is steady. A test in which you wear a portable device (event recorder or Holter monitor) to record your heart's electrical activity while you go about your day. An exercise test. How is this treated? Treatment for this condition depends on the cause of the condition and how severe your symptoms are. Treatment may involve: Treatment of the underlying condition. Changing your medicines or how much medicine you take. Having a small, battery-operated device called a pacemaker implanted under the skin. When bradycardia occurs, this device can be used to increase your heart rate and help your heart beat in a regular rhythm. Follow these instructions at home: Lifestyle Manage any health conditions that contribute to bradycardia as told by your health care provider. Follow a heart-healthy diet. A nutrition specialist (dietitian) can help educate you about healthy food options and changes. Follow an exercise program that is approved by your health care provider. Maintain a healthy weight. Try to reduce or manage your stress, such as with yoga or meditation. If you need help reducing stress, ask your health care provider. Do not use any products that contain nicotine or tobacco. These products include cigarettes, chewing tobacco, and vaping devices, such as e-cigarettes. If you need help quitting, ask your health care provider. Do not use illegal drugs. Alcohol use If you drink alcohol: Limit how much you have to: 0-1 drink a day for women who are not pregnant. 0-2 drinks a day  for men. Know how much alcohol is in a drink. In the [place] one drink equals one 12 oz bottle of  beer (355 mL), one 5 oz glass of wine (148 mL), or one 1 oz glass of hard liquor (44 mL). General instructions Take over-the-counter and prescription medicines only as told by your health care provider. Keep all follow-up visits. This is important. How is this prevented? In some cases, bradycardia may be prevented by: Treating underlying medical problems. Stopping behaviors or medicines that can trigger the condition. Contact a health care provider if: You feel light-headed or dizzy. You almost faint. You feel weak or are easily fatigued during physical activity. You experience confusion or have memory problems. Get help right away if: You faint. You have chest pains or an irregular heartbeat (palpitations). You have trouble breathing. These symptoms may represent a serious problem that is an emergency. Do not wait to see if the symptoms will go away. Get medical help right away. Call your local emergency services (911 in the [place] Do not drive yourself to the hospital. Summary Bradycardia is a slower-than-normal heartbeat. With bradycardia, the resting heart rate is less than 60 beats per minute. Treatment for this condition depends on the cause. Manage any health conditions that contribute to bradycardia as told by your health care provider. Do not use any products that contain nicotine or tobacco. These products include cigarettes, chewing tobacco, and vaping devices, such as e-cigarettes. Keep all follow-up visits. This is important. This information is not intended to replace advice given to you by your health care provider. Make sure you discuss any questions you have with your health care provider. Document Revised: 02/16/2021 Document Reviewed: 02/16/2021 Elsevier Patient Education  2024 [employer].

## 2023-12-24 NOTE — Progress Notes (Signed)
 Subjective:  Patient ID: Andre Mills, male    DOB: 1924/11/25  Age: 89 y.o. MRN: [id number]  CC: Neck Pain, Diabetes, Hypertension, and Gastroesophageal Reflux   HPI Andre Mills presents for f/up -----  Discussed the use of AI scribe software for clinical note transcription with the patient, who gave verbal consent to proceed.  History of Present Illness   Andre Mills is a 89 year old male who presents with right-sided neck pain.  He has been experiencing right-sided lateral neck pain that began last Thursday and persisted until yesterday. The pain is described as sharp and was present most of the time, particularly from morning until afternoon, but not constant throughout the day. He notes that the pain has been improving recently.  No radiation of the pain into his arms or legs. No associated numbness, weakness, or tingling. No fever or chills. He has good range of motion in his neck.  He has been taking two Tylenol for the pain but did not take any medication yesterday evening.  He mentions that he has been sitting in the same chair for twenty years and wonders if his sitting position while watching TV might be contributing to the pain. He also sometimes falls asleep while reading and falls over to the side, which he speculates might be related to the pain.  He has not experienced similar pain in the past.       Outpatient Medications Prior to Visit  Medication Sig Dispense Refill   calcium-vitamin D (OSCAL WITH D) 500-5 MG-MCG tablet Take 1 tablet by mouth daily.     ferrous sulfate 325 (65 FE) MG tablet Take 325 mg by mouth daily with breakfast.     gabapentin (NEURONTIN) 300 MG capsule Take 300 mg by mouth 2 (two) times daily.     Insulin Glargine (BASAGLAR KWIKPEN) 100 UNIT/ML Inject 20 Units into the skin daily. 18 mL 1   Insulin Pen Needle 32G X 6 MM MISC 1 Act by Does not apply route daily. 100 each 1   ketoconazole (NIZORAL) 2 % cream Apply 1 application  topically 2 (two) times daily. 60 g 2   Lancets (ONETOUCH DELICA PLUS LANCET33G) MISC 1 Act by Does not apply route daily. E11.9 100 each 5   metFORMIN (GLUCOPHAGE) 500 MG tablet Take 1 tablet (500 mg total) by mouth 2 (two) times daily with a meal. 180 tablet 3   omeprazole (PRILOSEC) 20 MG capsule Take 20 mg by mouth daily.     rosuvastatin (CRESTOR) 20 MG tablet Take 20 mg by mouth daily.     sitaGLIPtin (JANUVIA) 100 MG tablet Take 100 mg by mouth daily.     tamsulosin (FLOMAX) 0.4 MG CAPS capsule Take 0.4 mg by mouth daily.     timolol (BLOCADREN) 5 MG tablet Take 5 mg by mouth 2 (two) times daily.     triamcinolone cream (KENALOG) 0.5 % Apply 1 Application topically 3 (three) times daily. 30 g 1   Blood Glucose Monitoring Suppl (ONETOUCH VERIO FLEX SYSTEM) w/Device KIT Use to check blood sugar once a day. E11.9 (Patient not taking: Reported on 12/24/2023) 1 kit 0   glucose blood (ONETOUCH VERIO) test strip Use as instructed to check blood sugar once a day. E11.9 (Patient not taking: Reported on 12/24/2023) 100 each 5   Facility-Administered Medications Prior to Visit  Medication Dose Route Frequency Provider Last Rate Last Admin   cyanocobalamin ((VITAMIN B-12)) injection 1,000 mcg  1,000 mcg Intramuscular  Q30 days Etta Grandchild, MD   1,000 mcg at 02/08/23 0859    ROS Review of Systems  Constitutional: Negative.  Negative for diaphoresis, fatigue and unexpected weight change.  HENT: Negative.  Negative for trouble swallowing.   Eyes: Negative.  Negative for visual disturbance.  Respiratory: Negative.  Negative for cough, chest tightness, shortness of breath and wheezing.   Cardiovascular:  Negative for chest pain, palpitations and leg swelling.  Gastrointestinal: Negative.  Negative for abdominal pain, constipation, diarrhea, nausea and vomiting.  Endocrine: Negative.   Genitourinary: Negative.  Negative for difficulty urinating.  Musculoskeletal:  Positive for gait problem. Negative  for myalgias.  Skin: Negative.   Neurological:  Negative for dizziness, weakness and numbness.  Hematological:  Negative for adenopathy. Does not bruise/bleed easily.  Psychiatric/Behavioral:  Positive for confusion and decreased concentration.     Objective:  BP (!) 140/80 (BP Location: Left Arm, Patient Position: Sitting)   Pulse 67   Temp 97.6 F (36.4 C) (Oral)   Ht 5\' 7"  (1.702 m)   Wt 149 lb (67.6 kg)   SpO2 96%   BMI 23.34 kg/m   BP Readings from Last 3 Encounters:  12/24/23 (!) 140/80  09/21/23 (!) 144/60  09/20/23 138/70    Wt Readings from Last 3 Encounters:  12/24/23 149 lb (67.6 kg)  09/21/23 148 lb (67.1 kg)  09/20/23 148 lb (67.1 kg)    Physical Exam Vitals reviewed.  Constitutional:      Appearance: Normal appearance.  HENT:     Nose: Nose normal.     Mouth/Throat:     Mouth: Mucous membranes are moist.  Eyes:     General: No scleral icterus.    Conjunctiva/sclera: Conjunctivae normal.  Cardiovascular:     Rate and Rhythm: Regular rhythm. Bradycardia present.     Pulses: Normal pulses.     Heart sounds: No murmur heard.    No friction rub. No gallop.     Comments: EKG---  SB, 57 bpm NS IV conduction delay No LVH or acute ST/T wave changes  Pulmonary:     Effort: Pulmonary effort is normal.     Breath sounds: No stridor. No wheezing, rhonchi or rales.  Abdominal:     General: Abdomen is flat.     Palpations: There is no mass.     Tenderness: There is no abdominal tenderness. There is no guarding.     Hernia: No hernia is present.  Musculoskeletal:        General: Normal range of motion.     Cervical back: Neck supple.     Right lower leg: No edema.     Left lower leg: No edema.  Lymphadenopathy:     Cervical: No cervical adenopathy.  Skin:    General: Skin is warm and dry.  Neurological:     General: No focal deficit present.     Mental Status: He is alert. Mental status is at baseline.  Psychiatric:        Mood and Affect: Mood  normal.        Behavior: Behavior normal.     Lab Results  Component Value Date   WBC 5.4 09/21/2023   HGB 11.4 (L) 09/21/2023   HCT 33.6 (L) 09/21/2023   PLT 169.0 09/21/2023   GLUCOSE 148 (H) 09/21/2023   CHOL 98 09/21/2023   TRIG 108.0 09/21/2023   HDL 41.40 09/21/2023   LDLCALC 35 09/21/2023   ALT 11 10/23/2022   AST 15 10/23/2022  NA 138 09/21/2023   K 4.3 09/21/2023   CL 105 09/21/2023   CREATININE 1.05 09/21/2023   BUN 24 (H) 09/21/2023   CO2 26 09/21/2023   TSH 2.89 09/21/2023   INR 1.1 05/14/2009   HGBA1C 7.1 (H) 09/21/2023   MICROALBUR 2.7 (H) 09/21/2023    DG Ribs Bilateral W/Chest Result Date: 11/11/2016 CLINICAL DATA:  Larey Seat several days ago with anterior chest pain particularly right-sided, former smoking history EXAM: BILATERAL RIBS AND CHEST - 4+ VIEW COMPARISON:  Chest x-ray of 05/14/2009 FINDINGS: No active infiltrate or effusion is seen. No pneumothorax is noted. Mediastinal and hilar contours are unremarkable. The heart is minimally prominent. Bilateral rib detail film were obtained. There is very slight irregularity of the very anterior right seventh rib which could represent a nondisplaced fracture. On the left there appears to be an old fracture involving the left seventh rib anteriorly and laterally with thickening of the cortex. No acute left rib fracture is seen. IMPRESSION: 1. Possible nondisplaced fracture of the anterior right seventh rib. 2. Probable old fracture of the left anterolateral seventh rib. 3. No active lung disease. Electronically Signed   By: Dwyane Dee M.D.   On: 11/11/2016 11:43   DG Cervical Spine Complete Result Date: 12/24/2023 CLINICAL DATA:  right side neck pain EXAM: CERVICAL SPINE - COMPLETE 4+ VIEW COMPARISON:  02/15/2014 cervical spine radiographs FINDINGS: On the lateral view the cervical spine is visualized to the level of C7-T1. There is a normal cervical lordosis. Pre-vertebral soft tissues are within normal limits. No  fracture is detected in the cervical spine. Dens is well positioned between the lateral masses of C1. Chronic minimal 2 mm retrolisthesis at C4-5. No acute subluxation. Mild bilateral cervical facet arthropathy. Moderate multilevel cervical degenerative disc disease, most prominent at C5-6, mildly worsened. Moderate degenerative foraminal stenosis on the right at C3-4, C4-5 and C5-6. No aggressive-appearing focal osseous lesions. IMPRESSION: 1. Moderate multilevel cervical degenerative disc disease, most prominent at C5-6, mildly worsened since 2015. 2. Moderate degenerative foraminal stenosis on the right at C3-4, C4-5 and C5-6. Electronically Signed   By: Delbert Phenix M.D.   On: 12/24/2023 12:44       Assessment & Plan:   Acute neck pain -     DG Cervical Spine Complete; Future  Primary hypertension- His BP is well controlled.  Foraminal stenosis of cervical region- He is neurologically intact.  Bradycardia on ECG- He is asx with this.     Follow-up: Return in about 3 months (around 03/22/2024).  Sanda Linger, MD

## 2023-12-27 DIAGNOSIS — R001 Bradycardia, unspecified: Secondary | ICD-10-CM | POA: Insufficient documentation

## 2023-12-30 NOTE — Addendum Note (Signed)
 Addended by: Katharine Look on: 12/30/2023 10:39 AM   Modules accepted: Orders

## 2023-12-31 ENCOUNTER — Telehealth: Payer: Self-pay

## 2023-12-31 ENCOUNTER — Ambulatory Visit: Payer: Medicare Other

## 2023-12-31 DIAGNOSIS — E538 Deficiency of other specified B group vitamins: Secondary | ICD-10-CM

## 2023-12-31 MED ORDER — CYANOCOBALAMIN 1000 MCG/ML IJ SOLN
1000.0000 ug | Freq: Once | INTRAMUSCULAR | Status: AC
  Administered 2023-12-31: 1000 ug via INTRAMUSCULAR

## 2023-12-31 NOTE — Telephone Encounter (Signed)
 Patient labs has been placed on Dr. Yetta Barre desk for review.

## 2023-12-31 NOTE — Telephone Encounter (Signed)
 Patinent visited today for their b-12 injection. During the visit patient had provided me with a set of printed lab results that he wanted to get back to Dr.Jones. Labs results have been placed in Dr.Jones' mailbox.

## 2023-12-31 NOTE — Progress Notes (Signed)
 Patient visits today for their b-12 injection. Patient informed of what they had received and tolerated injection well. Patient notified to reach out to office if needed.

## 2024-01-25 ENCOUNTER — Encounter: Payer: Self-pay | Admitting: Internal Medicine

## 2024-01-25 ENCOUNTER — Ambulatory Visit: Payer: Medicare Other | Admitting: Internal Medicine

## 2024-01-25 VITALS — BP 134/62 | HR 69 | Temp 98.0°F | Resp 16 | Ht 67.0 in | Wt 151.0 lb

## 2024-01-25 DIAGNOSIS — N1832 Chronic kidney disease, stage 3b: Secondary | ICD-10-CM

## 2024-01-25 DIAGNOSIS — E785 Hyperlipidemia, unspecified: Secondary | ICD-10-CM

## 2024-01-25 DIAGNOSIS — E1122 Type 2 diabetes mellitus with diabetic chronic kidney disease: Secondary | ICD-10-CM | POA: Diagnosis not present

## 2024-01-25 DIAGNOSIS — D509 Iron deficiency anemia, unspecified: Secondary | ICD-10-CM | POA: Insufficient documentation

## 2024-01-25 DIAGNOSIS — D508 Other iron deficiency anemias: Secondary | ICD-10-CM

## 2024-01-25 DIAGNOSIS — D510 Vitamin B12 deficiency anemia due to intrinsic factor deficiency: Secondary | ICD-10-CM | POA: Diagnosis not present

## 2024-01-25 DIAGNOSIS — E113513 Type 2 diabetes mellitus with proliferative diabetic retinopathy with macular edema, bilateral: Secondary | ICD-10-CM

## 2024-01-25 DIAGNOSIS — Z7984 Long term (current) use of oral hypoglycemic drugs: Secondary | ICD-10-CM | POA: Diagnosis not present

## 2024-01-25 DIAGNOSIS — E11311 Type 2 diabetes mellitus with unspecified diabetic retinopathy with macular edema: Secondary | ICD-10-CM | POA: Insufficient documentation

## 2024-01-25 DIAGNOSIS — E113213 Type 2 diabetes mellitus with mild nonproliferative diabetic retinopathy with macular edema, bilateral: Secondary | ICD-10-CM | POA: Insufficient documentation

## 2024-01-25 LAB — BASIC METABOLIC PANEL
BUN: 24 mg/dL — ABNORMAL HIGH (ref 6–23)
CO2: 28 meq/L (ref 19–32)
Calcium: 9.2 mg/dL (ref 8.4–10.5)
Chloride: 104 meq/L (ref 96–112)
Creatinine, Ser: 1.11 mg/dL (ref 0.40–1.50)
GFR: 55.92 mL/min — ABNORMAL LOW (ref >60.00)
Glucose, Bld: 91 mg/dL (ref 70–99)
Potassium: 5 meq/L (ref 3.5–5.1)
Sodium: 138 meq/L (ref 135–145)

## 2024-01-25 LAB — IBC + FERRITIN
Ferritin: 36.6 ng/mL (ref 22.0–322.0)
Iron: 60 ug/dL (ref 42–165)
Saturation Ratios: 15.3 % — ABNORMAL LOW (ref 20.0–50.0)
TIBC: 392 ug/dL (ref 250.0–450.0)
Transferrin: 280 mg/dL (ref 212.0–360.0)

## 2024-01-25 LAB — HEPATIC FUNCTION PANEL
ALT: 11 U/L (ref 0–53)
AST: 13 U/L (ref 0–37)
Albumin: 4.4 g/dL (ref 3.5–5.2)
Alkaline Phosphatase: 67 U/L (ref 39–117)
Bilirubin, Direct: 0.1 mg/dL (ref 0.0–0.3)
Total Bilirubin: 0.3 mg/dL (ref 0.2–1.2)
Total Protein: 7.4 g/dL (ref 6.0–8.3)

## 2024-01-25 LAB — CBC WITH DIFFERENTIAL/PLATELET
Basophils Absolute: 0.1 10*3/uL (ref 0.0–0.1)
Basophils Relative: 1.1 % (ref 0.0–3.0)
Eosinophils Absolute: 0.4 10*3/uL (ref 0.0–0.7)
Eosinophils Relative: 6.5 % — ABNORMAL HIGH (ref 0.0–5.0)
HCT: 37.5 % — ABNORMAL LOW (ref 39.0–52.0)
Hemoglobin: 12.5 g/dL — ABNORMAL LOW (ref 13.0–17.0)
Lymphocytes Relative: 29.6 % (ref 12.0–46.0)
Lymphs Abs: 1.7 10*3/uL (ref 0.7–4.0)
MCHC: 33.4 g/dL (ref 30.0–36.0)
MCV: 96.5 fl (ref 78.0–100.0)
Monocytes Absolute: 0.5 10*3/uL (ref 0.1–1.0)
Monocytes Relative: 8.4 % (ref 3.0–12.0)
Neutro Abs: 3.2 10*3/uL (ref 1.4–7.7)
Neutrophils Relative %: 54.4 % (ref 43.0–77.0)
Platelets: 164 10*3/uL (ref 150.0–400.0)
RBC: 3.89 Mil/uL — ABNORMAL LOW (ref 4.22–5.81)
RDW: 14.7 % (ref 11.5–15.5)
WBC: 5.8 10*3/uL (ref 4.0–10.5)

## 2024-01-25 LAB — FOLATE: Folate: 21.7 ng/mL (ref >5.9)

## 2024-01-25 NOTE — Patient Instructions (Signed)

## 2024-01-25 NOTE — Progress Notes (Unsigned)
 Subjective:  Patient ID: Andre Mills, male    DOB: 19-Nov-1924  Age: 89 y.o. MRN: [id number]  CC: Anemia and Diabetes   HPI HAN VEJAR presents for f/up ----  He feels well. Offers no complaints.  Outpatient Medications Prior to Visit  Medication Sig Dispense Refill   calcium-vitamin D (OSCAL WITH D) 500-5 MG-MCG tablet Take 1 tablet by mouth daily.     ferrous sulfate 325 (65 FE) MG tablet Take 325 mg by mouth daily with breakfast.     gabapentin (NEURONTIN) 300 MG capsule Take 300 mg by mouth 2 (two) times daily.     Insulin Glargine (BASAGLAR KWIKPEN) 100 UNIT/ML Inject 20 Units into the skin daily. 18 mL 1   Insulin Pen Needle 32G X 6 MM MISC 1 Act by Does not apply route daily. 100 each 1   ketoconazole (NIZORAL) 2 % cream Apply 1 application topically 2 (two) times daily. 60 g 2   Lancets (ONETOUCH DELICA PLUS LANCET33G) MISC 1 Act by Does not apply route daily. E11.9 100 each 5   metFORMIN (GLUCOPHAGE) 500 MG tablet Take 1 tablet (500 mg total) by mouth 2 (two) times daily with a meal. 180 tablet 3   omeprazole (PRILOSEC) 20 MG capsule Take 20 mg by mouth daily.     rosuvastatin (CRESTOR) 20 MG tablet Take 20 mg by mouth daily.     sitaGLIPtin (JANUVIA) 100 MG tablet Take 100 mg by mouth daily.     tamsulosin (FLOMAX) 0.4 MG CAPS capsule Take 0.4 mg by mouth daily.     timolol (BLOCADREN) 5 MG tablet Take 5 mg by mouth 2 (two) times daily.     triamcinolone cream (KENALOG) 0.5 % Apply 1 Application topically 3 (three) times daily. 30 g 1   Blood Glucose Monitoring Suppl (ONETOUCH VERIO FLEX SYSTEM) w/Device KIT Use to check blood sugar once a day. E11.9 1 kit 0   glucose blood (ONETOUCH VERIO) test strip Use as instructed to check blood sugar once a day. E11.9 100 each 5   Facility-Administered Medications Prior to Visit  Medication Dose Route Frequency Provider Last Rate Last Admin   cyanocobalamin ((VITAMIN B-12)) injection 1,000 mcg  1,000 mcg Intramuscular Q30  days Etta Grandchild, MD   1,000 mcg at 02/08/23 0859    ROS Review of Systems  Objective:  BP 134/62 (BP Location: Left Arm, Patient Position: Sitting, Cuff Size: Normal)   Pulse 69   Temp 98 F (36.7 C) (Oral)   Resp 16   Ht 5\' 7"  (1.702 m)   Wt 151 lb (68.5 kg)   SpO2 95%   BMI 23.65 kg/m   BP Readings from Last 3 Encounters:  01/25/24 134/62  12/24/23 (!) 140/80  09/21/23 (!) 144/60    Wt Readings from Last 3 Encounters:  01/25/24 151 lb (68.5 kg)  12/24/23 149 lb (67.6 kg)  09/21/23 148 lb (67.1 kg)    Physical Exam  Lab Results  Component Value Date   WBC 5.4 09/21/2023   HGB 11.4 (L) 09/21/2023   HCT 33.6 (L) 09/21/2023   PLT 169.0 09/21/2023   GLUCOSE 148 (H) 09/21/2023   CHOL 98 09/21/2023   TRIG 108.0 09/21/2023   HDL 41.40 09/21/2023   LDLCALC 35 09/21/2023   ALT 11 10/23/2022   AST 15 10/23/2022   NA 138 09/21/2023   K 4.3 09/21/2023   CL 105 09/21/2023   CREATININE 1.05 09/21/2023   BUN 24 (  H) 09/21/2023   CO2 26 09/21/2023   TSH 2.89 09/21/2023   INR 1.1 05/14/2009   HGBA1C 7.1 (H) 09/21/2023   MICROALBUR 2.7 (H) 09/21/2023    DG Ribs Bilateral W/Chest Result Date: 11/11/2016 CLINICAL DATA:  Larey Seat several days ago with anterior chest pain particularly right-sided, former smoking history EXAM: BILATERAL RIBS AND CHEST - 4+ VIEW COMPARISON:  Chest x-ray of 05/14/2009 FINDINGS: No active infiltrate or effusion is seen. No pneumothorax is noted. Mediastinal and hilar contours are unremarkable. The heart is minimally prominent. Bilateral rib detail film were obtained. There is very slight irregularity of the very anterior right seventh rib which could represent a nondisplaced fracture. On the left there appears to be an old fracture involving the left seventh rib anteriorly and laterally with thickening of the cortex. No acute left rib fracture is seen. IMPRESSION: 1. Possible nondisplaced fracture of the anterior right seventh rib. 2. Probable old  fracture of the left anterolateral seventh rib. 3. No active lung disease. Electronically Signed   By: Dwyane Dee M.D.   On: 11/11/2016 11:43    Assessment & Plan:  Iron deficiency anemia secondary to inadequate dietary iron intake -     IBC + Ferritin; Future -     CBC with Differential/Platelet; Future  Type 2 diabetes mellitus with both eyes affected by proliferative retinopathy and macular edema, without long-term current use of insulin (HCC) -     Basic metabolic panel; Future  Type 2 diabetes mellitus with stage 3b chronic kidney disease, without long-term current use of insulin (HCC) -     HM Diabetes Foot Exam  Vitamin B12 deficiency anemia due to intrinsic factor deficiency -     Folate; Future  Dyslipidemia, goal LDL below 100 -     Hepatic function panel; Future     Follow-up: Return in about 4 months (around 05/26/2024).  Sanda Linger, MD

## 2024-01-26 ENCOUNTER — Encounter: Payer: Self-pay | Admitting: Internal Medicine

## 2024-01-26 LAB — HEMOGLOBIN A1C: Hgb A1c MFr Bld: 7.5 % — ABNORMAL HIGH (ref 4.6–6.5)

## 2024-01-31 ENCOUNTER — Ambulatory Visit (INDEPENDENT_AMBULATORY_CARE_PROVIDER_SITE_OTHER): Payer: Medicare Other

## 2024-01-31 DIAGNOSIS — E538 Deficiency of other specified B group vitamins: Secondary | ICD-10-CM | POA: Diagnosis not present

## 2024-01-31 MED ORDER — CYANOCOBALAMIN 1000 MCG/ML IJ SOLN
1000.0000 ug | Freq: Once | INTRAMUSCULAR | Status: AC
  Administered 2024-01-31: 1000 ug via INTRAMUSCULAR

## 2024-01-31 NOTE — Progress Notes (Signed)
 After obtaining consent, and per orders of Dr. Yetta Barre, injection of B12 given by Ferdie Ping. Patient instructed to report any adverse reaction to me immediately.

## 2024-03-06 ENCOUNTER — Ambulatory Visit (INDEPENDENT_AMBULATORY_CARE_PROVIDER_SITE_OTHER)

## 2024-03-06 DIAGNOSIS — E538 Deficiency of other specified B group vitamins: Secondary | ICD-10-CM | POA: Diagnosis not present

## 2024-03-06 NOTE — Progress Notes (Signed)
Pt here for monthly B12 injection per Dr. Yetta Barre  B12 given IM. and pt tolerated injection well.

## 2024-04-06 ENCOUNTER — Ambulatory Visit (INDEPENDENT_AMBULATORY_CARE_PROVIDER_SITE_OTHER)

## 2024-04-06 DIAGNOSIS — E538 Deficiency of other specified B group vitamins: Secondary | ICD-10-CM

## 2024-04-06 MED ORDER — CYANOCOBALAMIN 1000 MCG/ML IJ SOLN
1000.0000 ug | Freq: Once | INTRAMUSCULAR | Status: AC
  Administered 2024-04-06: 1000 ug via INTRAMUSCULAR

## 2024-04-06 NOTE — Progress Notes (Signed)
 Patient visits today for their b-12 injection. Patient informed of what they had received and tolerated injection well. Patient notified to reach out to office if needed.

## 2024-04-11 DIAGNOSIS — L304 Erythema intertrigo: Secondary | ICD-10-CM | POA: Diagnosis not present

## 2024-04-11 DIAGNOSIS — Z85828 Personal history of other malignant neoplasm of skin: Secondary | ICD-10-CM | POA: Diagnosis not present

## 2024-04-11 DIAGNOSIS — L300 Nummular dermatitis: Secondary | ICD-10-CM | POA: Diagnosis not present

## 2024-05-04 LAB — MICROALBUMIN, URINE: Microalb, Ur: 0.5

## 2024-05-04 LAB — CBC AND DIFFERENTIAL
HCT: 37 — AB (ref 41–53)
Hemoglobin: 12.3 — AB (ref 13.5–17.5)
Platelets: 166 K/uL (ref 150–400)
WBC: 5.6

## 2024-05-04 LAB — VITAMIN D 25 HYDROXY (VIT D DEFICIENCY, FRACTURES): Vit D, 25-Hydroxy: 39

## 2024-05-04 LAB — HEPATIC FUNCTION PANEL
ALT: 13 U/L (ref 10–40)
AST: 16 (ref 14–40)
Alkaline Phosphatase: 75 (ref 25–125)
Bilirubin, Direct: 0.1 (ref 0.01–0.4)
Bilirubin, Total: 0.3

## 2024-05-04 LAB — TSH: TSH: 3.6 (ref 0.41–5.90)

## 2024-05-04 LAB — LIPID PANEL
Cholesterol: 102 (ref 0–200)
HDL: 44 (ref 35–70)
LDL Cholesterol: 44
Triglycerides: 104 (ref 40–160)

## 2024-05-04 LAB — IRON,TIBC AND FERRITIN PANEL
Ferritin: 62
Iron: 59
TIBC: 334

## 2024-05-04 LAB — PROTEIN / CREATININE RATIO, URINE: Creatinine, Urine: 27.5

## 2024-05-04 LAB — HEMOGLOBIN A1C: Hemoglobin A1C: 7.2

## 2024-05-04 LAB — MICROALBUMIN / CREATININE URINE RATIO: Microalb Creat Ratio: 18.2

## 2024-05-08 ENCOUNTER — Ambulatory Visit (INDEPENDENT_AMBULATORY_CARE_PROVIDER_SITE_OTHER)

## 2024-05-08 DIAGNOSIS — E538 Deficiency of other specified B group vitamins: Secondary | ICD-10-CM

## 2024-05-08 MED ORDER — CYANOCOBALAMIN 1000 MCG/ML IJ SOLN
1000.0000 ug | Freq: Once | INTRAMUSCULAR | Status: AC
  Administered 2024-05-08: 1000 ug via INTRAMUSCULAR

## 2024-05-08 NOTE — Progress Notes (Signed)
 After obtaining consent, and per orders of Dr. Yetta Barre, injection of B12 given by Ferdie Ping. Patient instructed to report any adverse reaction to me immediately.

## 2024-05-17 ENCOUNTER — Telehealth: Payer: Self-pay | Admitting: Internal Medicine

## 2024-05-17 NOTE — Telephone Encounter (Signed)
 Lab results from [facility] placed in Dr. Joshua Box up front - sa

## 2024-05-19 NOTE — Telephone Encounter (Signed)
 Results has been given to Dr. Joshua

## 2024-05-31 ENCOUNTER — Ambulatory Visit (INDEPENDENT_AMBULATORY_CARE_PROVIDER_SITE_OTHER): Admitting: Internal Medicine

## 2024-05-31 ENCOUNTER — Encounter: Payer: Self-pay | Admitting: Internal Medicine

## 2024-05-31 VITALS — BP 144/62 | HR 65 | Temp 97.7°F | Ht 67.0 in | Wt 147.0 lb

## 2024-05-31 DIAGNOSIS — E1122 Type 2 diabetes mellitus with diabetic chronic kidney disease: Secondary | ICD-10-CM

## 2024-05-31 DIAGNOSIS — N1832 Chronic kidney disease, stage 3b: Secondary | ICD-10-CM | POA: Diagnosis not present

## 2024-05-31 DIAGNOSIS — K589 Irritable bowel syndrome without diarrhea: Secondary | ICD-10-CM | POA: Insufficient documentation

## 2024-05-31 DIAGNOSIS — I10 Essential (primary) hypertension: Secondary | ICD-10-CM

## 2024-05-31 DIAGNOSIS — M25559 Pain in unspecified hip: Secondary | ICD-10-CM | POA: Insufficient documentation

## 2024-05-31 DIAGNOSIS — N182 Chronic kidney disease, stage 2 (mild): Secondary | ICD-10-CM | POA: Insufficient documentation

## 2024-05-31 DIAGNOSIS — N400 Enlarged prostate without lower urinary tract symptoms: Secondary | ICD-10-CM | POA: Insufficient documentation

## 2024-05-31 DIAGNOSIS — R2681 Unsteadiness on feet: Secondary | ICD-10-CM | POA: Insufficient documentation

## 2024-05-31 DIAGNOSIS — E538 Deficiency of other specified B group vitamins: Secondary | ICD-10-CM | POA: Insufficient documentation

## 2024-05-31 DIAGNOSIS — H401222 Low-tension glaucoma, left eye, moderate stage: Secondary | ICD-10-CM | POA: Insufficient documentation

## 2024-05-31 DIAGNOSIS — H409 Unspecified glaucoma: Secondary | ICD-10-CM | POA: Insufficient documentation

## 2024-05-31 NOTE — Progress Notes (Unsigned)
 Subjective:  Patient ID: Andre Mills, male    DOB: 1925-07-02  Age: 89 y.o. MRN: [id number]  CC: Hypertension, Anemia, and Diabetes   HPI Andre Mills presents for f/up ----  Discussed the use of AI scribe software for clinical note transcription with the patient, who gave verbal consent to proceed.  History of Present Illness He is a 89 year old male who presents for a routine follow-up visit.  He feels good overall with no chest pain, shortness of breath, dizziness, or lightheadedness.  He mentions having had lab work done at the [facility], where he drew ten samples, but he has no concerns about the results.  He tends to stay cold-natured and often wears additional clothing to stay warm.    Outpatient Medications Prior to Visit  Medication Sig Dispense Refill   calcium-vitamin D  (OSCAL WITH D) 500-5 MG-MCG tablet Take 1 tablet by mouth daily.     ferrous sulfate 325 (65 FE) MG tablet Take 325 mg by mouth daily with breakfast.     gabapentin (NEURONTIN) 300 MG capsule Take 300 mg by mouth 2 (two) times daily.     Insulin  Glargine (BASAGLAR  KWIKPEN) 100 UNIT/ML Inject 20 Units into the skin daily. 18 mL 1   Insulin  Pen Needle 32G X 6 MM MISC 1 Act by Does not apply route daily. 100 each 1   ketoconazole  (NIZORAL ) 2 % cream Apply 1 application topically 2 (two) times daily. 60 g 2   Lancets (ONETOUCH DELICA PLUS LANCET33G) MISC 1 Act by Does not apply route daily. E11.9 100 each 5   metFORMIN  (GLUCOPHAGE ) 500 MG tablet Take 1 tablet (500 mg total) by mouth 2 (two) times daily with a meal. 180 tablet 3   omeprazole (PRILOSEC) 20 MG capsule Take 20 mg by mouth daily.     rosuvastatin (CRESTOR) 20 MG tablet Take 20 mg by mouth daily.     sitaGLIPtin (JANUVIA) 100 MG tablet Take 100 mg by mouth daily.     tamsulosin (FLOMAX) 0.4 MG CAPS capsule Take 0.4 mg by mouth daily.     timolol (BLOCADREN) 5 MG tablet Take 5 mg by mouth 2 (two) times daily.     triamcinolone  cream (KENALOG ) 0.5 %  Apply 1 Application topically 3 (three) times daily. 30 g 1   Facility-Administered Medications Prior to Visit  Medication Dose Route Frequency Provider Last Rate Last Admin   cyanocobalamin  ((VITAMIN B-12)) injection 1,000 mcg  1,000 mcg Intramuscular Q30 days Joshua Debby CROME, MD   1,000 mcg at 03/06/24 1001    ROS Review of Systems  Objective:  BP (!) 144/62 (BP Location: Left Arm, Patient Position: Sitting, Cuff Size: Normal)   Pulse 65   Temp 97.7 F (36.5 C) (Oral)   Ht 5' 7 (1.702 m)   Wt 147 lb (66.7 kg)   SpO2 95%   BMI 23.02 kg/m   BP Readings from Last 3 Encounters:  05/31/24 (!) 144/62  01/25/24 134/62  12/24/23 (!) 140/80    Wt Readings from Last 3 Encounters:  05/31/24 147 lb (66.7 kg)  01/25/24 151 lb (68.5 kg)  12/24/23 149 lb (67.6 kg)    Physical Exam  Lab Results  Component Value Date   WBC 5.8 01/25/2024   HGB 12.5 (L) 01/25/2024   HCT 37.5 (L) 01/25/2024   PLT 164.0 01/25/2024   GLUCOSE 91 01/25/2024   CHOL 98 09/21/2023   TRIG 108.0 09/21/2023   HDL 41.40 09/21/2023   LDLCALC  35 09/21/2023   ALT 11 01/25/2024   AST 13 01/25/2024   NA 138 01/25/2024   K 5.0 01/25/2024   CL 104 01/25/2024   CREATININE 1.11 01/25/2024   BUN 24 (H) 01/25/2024   CO2 28 01/25/2024   TSH 2.89 09/21/2023   INR 1.1 05/14/2009   HGBA1C 7.5 (H) 01/26/2024   MICROALBUR 2.56 07/27/2022    DG Ribs Bilateral W/Chest Result Date: 11/11/2016 CLINICAL DATA:  Clemens several days ago with anterior chest pain particularly right-sided, former smoking history EXAM: BILATERAL RIBS AND CHEST - 4+ VIEW COMPARISON:  Chest x-ray of 05/14/2009 FINDINGS: No active infiltrate or effusion is seen. No pneumothorax is noted. Mediastinal and hilar contours are unremarkable. The heart is minimally prominent. Bilateral rib detail film were obtained. There is very slight irregularity of the very anterior right seventh rib which could represent a nondisplaced fracture. On the left there  appears to be an old fracture involving the left seventh rib anteriorly and laterally with thickening of the cortex. No acute left rib fracture is seen. IMPRESSION: 1. Possible nondisplaced fracture of the anterior right seventh rib. 2. Probable old fracture of the left anterolateral seventh rib. 3. No active lung disease. Electronically Signed   By: Deward Dames M.D.   On: 11/11/2016 11:43    Assessment & Plan:  There are no diagnoses linked to this encounter.   Follow-up: No follow-ups on file.  Debby Molt, MD

## 2024-05-31 NOTE — Patient Instructions (Signed)
 Hypertension, Adult High blood pressure (hypertension) is when the force of blood pumping through the arteries is too strong. The arteries are the blood vessels that carry blood from the heart throughout the body. Hypertension forces the heart to work harder to pump blood and may cause arteries to become narrow or stiff. Untreated or uncontrolled hypertension can lead to a heart attack, heart failure, a stroke, kidney disease, and other problems. A blood pressure reading consists of a higher number over a lower number. Ideally, your blood pressure should be below 120/80. The first ("top") number is called the systolic pressure. It is a measure of the pressure in your arteries as your heart beats. The second ("bottom") number is called the diastolic pressure. It is a measure of the pressure in your arteries as the heart relaxes. What are the causes? The exact cause of this condition is not known. There are some conditions that result in high blood pressure. What increases the risk? Certain factors may make you more likely to develop high blood pressure. Some of these risk factors are under your control, including: Smoking. Not getting enough exercise or physical activity. Being overweight. Having too much fat, sugar, calories, or salt (sodium) in your diet. Drinking too much alcohol. Other risk factors include: Having a personal history of heart disease, diabetes, high cholesterol, or kidney disease. Stress. Having a family history of high blood pressure and high cholesterol. Having obstructive sleep apnea. Age. The risk increases with age. What are the signs or symptoms? High blood pressure may not cause symptoms. Very high blood pressure (hypertensive crisis) may cause: Headache. Fast or irregular heartbeats (palpitations). Shortness of breath. Nosebleed. Nausea and vomiting. Vision changes. Severe chest pain, dizziness, and seizures. How is this diagnosed? This condition is diagnosed by  measuring your blood pressure while you are seated, with your arm resting on a flat surface, your legs uncrossed, and your feet flat on the floor. The cuff of the blood pressure monitor will be placed directly against the skin of your upper arm at the level of your heart. Blood pressure should be measured at least twice using the same arm. Certain conditions can cause a difference in blood pressure between your right and left arms. If you have a high blood pressure reading during one visit or you have normal blood pressure with other risk factors, you may be asked to: Return on a different day to have your blood pressure checked again. Monitor your blood pressure at home for 1 week or longer. If you are diagnosed with hypertension, you may have other blood or imaging tests to help your health care provider understand your overall risk for other conditions. How is this treated? This condition is treated by making healthy lifestyle changes, such as eating healthy foods, exercising more, and reducing your alcohol intake. You may be referred for counseling on a healthy diet and physical activity. Your health care provider may prescribe medicine if lifestyle changes are not enough to get your blood pressure under control and if: Your systolic blood pressure is above 130. Your diastolic blood pressure is above 80. Your personal target blood pressure may vary depending on your medical conditions, your age, and other factors. Follow these instructions at home: Eating and drinking  Eat a diet that is high in fiber and potassium, and low in sodium, added sugar, and fat. An example of this eating plan is called the DASH diet. DASH stands for Dietary Approaches to Stop Hypertension. To eat this way: Eat  plenty of fresh fruits and vegetables. Try to fill one half of your plate at each meal with fruits and vegetables. Eat whole grains, such as whole-wheat pasta, brown rice, or whole-grain bread. Fill about one  fourth of your plate with whole grains. Eat or drink low-fat dairy products, such as skim milk or low-fat yogurt. Avoid fatty cuts of meat, processed or cured meats, and poultry with skin. Fill about one fourth of your plate with lean proteins, such as fish, chicken without skin, beans, eggs, or tofu. Avoid pre-made and processed foods. These tend to be higher in sodium, added sugar, and fat. Reduce your daily sodium intake. Many people with hypertension should eat less than 1,500 mg of sodium a day. Do not drink alcohol if: Your health care provider tells you not to drink. You are pregnant, may be pregnant, or are planning to become pregnant. If you drink alcohol: Limit how much you have to: 0-1 drink a day for women. 0-2 drinks a day for men. Know how much alcohol is in your drink. In the [place] one drink equals one 12 oz bottle of beer (355 mL), one 5 oz glass of wine (148 mL), or one 1 oz glass of hard liquor (44 mL). Lifestyle  Work with your health care provider to maintain a healthy body weight or to lose weight. Ask what an ideal weight is for you. Get at least 30 minutes of exercise that causes your heart to beat faster (aerobic exercise) most days of the week. Activities may include walking, swimming, or biking. Include exercise to strengthen your muscles (resistance exercise), such as Pilates or lifting weights, as part of your weekly exercise routine. Try to do these types of exercises for 30 minutes at least 3 days a week. Do not use any products that contain nicotine or tobacco. These products include cigarettes, chewing tobacco, and vaping devices, such as e-cigarettes. If you need help quitting, ask your health care provider. Monitor your blood pressure at home as told by your health care provider. Keep all follow-up visits. This is important. Medicines Take over-the-counter and prescription medicines only as told by your health care provider. Follow directions carefully. Blood  pressure medicines must be taken as prescribed. Do not skip doses of blood pressure medicine. Doing this puts you at risk for problems and can make the medicine less effective. Ask your health care provider about side effects or reactions to medicines that you should watch for. Contact a health care provider if you: Think you are having a reaction to a medicine you are taking. Have headaches that keep coming back (recurring). Feel dizzy. Have swelling in your ankles. Have trouble with your vision. Get help right away if you: Develop a severe headache or confusion. Have unusual weakness or numbness. Feel faint. Have severe pain in your chest or abdomen. Vomit repeatedly. Have trouble breathing. These symptoms may be an emergency. Get help right away. Call 911. Do not wait to see if the symptoms will go away. Do not drive yourself to the hospital. Summary Hypertension is when the force of blood pumping through your arteries is too strong. If this condition is not controlled, it may put you at risk for serious complications. Your personal target blood pressure may vary depending on your medical conditions, your age, and other factors. For most people, a normal blood pressure is less than 120/80. Hypertension is treated with lifestyle changes, medicines, or a combination of both. Lifestyle changes include losing weight, eating a healthy,  low-sodium diet, exercising more, and limiting alcohol. This information is not intended to replace advice given to you by your health care provider. Make sure you discuss any questions you have with your health care provider. Document Revised: 09/02/2021 Document Reviewed: 09/02/2021 Elsevier Patient Education  2024 [employer].

## 2024-06-07 ENCOUNTER — Ambulatory Visit

## 2024-06-08 ENCOUNTER — Ambulatory Visit (INDEPENDENT_AMBULATORY_CARE_PROVIDER_SITE_OTHER)

## 2024-06-08 DIAGNOSIS — D510 Vitamin B12 deficiency anemia due to intrinsic factor deficiency: Secondary | ICD-10-CM

## 2024-06-08 MED ORDER — CYANOCOBALAMIN 1000 MCG/ML IJ SOLN
1000.0000 ug | Freq: Once | INTRAMUSCULAR | Status: AC
  Administered 2024-06-08: 1000 ug via INTRAMUSCULAR

## 2024-06-08 NOTE — Progress Notes (Signed)
 Pt was given Monthly B12 injection w/o any complications at this time.

## 2024-07-12 ENCOUNTER — Ambulatory Visit (INDEPENDENT_AMBULATORY_CARE_PROVIDER_SITE_OTHER)

## 2024-07-12 DIAGNOSIS — E538 Deficiency of other specified B group vitamins: Secondary | ICD-10-CM | POA: Diagnosis not present

## 2024-07-12 DIAGNOSIS — D510 Vitamin B12 deficiency anemia due to intrinsic factor deficiency: Secondary | ICD-10-CM

## 2024-07-12 NOTE — Progress Notes (Signed)
 After obtaining consent, and per orders of Dr. Yetta Barre, injection of B12 given by Ferdie Ping. Patient instructed to any adverse reaction to me immediately.

## 2024-08-14 ENCOUNTER — Ambulatory Visit

## 2024-08-14 DIAGNOSIS — Z23 Encounter for immunization: Secondary | ICD-10-CM | POA: Diagnosis not present

## 2024-08-14 DIAGNOSIS — E538 Deficiency of other specified B group vitamins: Secondary | ICD-10-CM | POA: Diagnosis not present

## 2024-08-14 MED ORDER — CYANOCOBALAMIN 1000 MCG/ML IJ SOLN
1000.0000 ug | Freq: Once | INTRAMUSCULAR | Status: AC
  Administered 2024-08-14: 1000 ug via INTRAMUSCULAR

## 2024-08-14 NOTE — Progress Notes (Signed)
 After obtaining consent, and per orders of Dr. Joshua, injection of B12 and HDFL given by Ronnald SHAUNNA Palms. Patient instructed to report any adverse reaction to me immediately.

## 2024-09-15 ENCOUNTER — Ambulatory Visit (INDEPENDENT_AMBULATORY_CARE_PROVIDER_SITE_OTHER)

## 2024-09-15 DIAGNOSIS — E538 Deficiency of other specified B group vitamins: Secondary | ICD-10-CM | POA: Diagnosis not present

## 2024-09-15 MED ORDER — CYANOCOBALAMIN 1000 MCG/ML IJ SOLN
1000.0000 ug | Freq: Once | INTRAMUSCULAR | Status: AC
  Administered 2024-09-15: 1000 ug via INTRAMUSCULAR

## 2024-09-15 NOTE — Progress Notes (Signed)
 Patient visits today for their b-12 injection. Patient informed of what they had received and tolerated injection well. Patient notified to reach out to the office if needed.

## 2024-10-16 ENCOUNTER — Ambulatory Visit (INDEPENDENT_AMBULATORY_CARE_PROVIDER_SITE_OTHER)

## 2024-10-16 DIAGNOSIS — D510 Vitamin B12 deficiency anemia due to intrinsic factor deficiency: Secondary | ICD-10-CM | POA: Diagnosis not present

## 2024-10-16 MED ORDER — CYANOCOBALAMIN 1000 MCG/ML IJ SOLN
1000.0000 ug | Freq: Once | INTRAMUSCULAR | Status: AC
  Administered 2024-10-16: 1000 ug via INTRAMUSCULAR

## 2024-10-16 NOTE — Progress Notes (Signed)
 Pt was given B12 injec w/o any complications at this time.

## 2024-11-16 ENCOUNTER — Ambulatory Visit

## 2024-11-17 ENCOUNTER — Ambulatory Visit

## 2024-11-17 ENCOUNTER — Ambulatory Visit (INDEPENDENT_AMBULATORY_CARE_PROVIDER_SITE_OTHER)

## 2024-11-17 VITALS — Ht 67.0 in | Wt 147.0 lb

## 2024-11-17 DIAGNOSIS — Z0000 Encounter for general adult medical examination without abnormal findings: Secondary | ICD-10-CM | POA: Diagnosis not present

## 2024-11-17 DIAGNOSIS — D510 Vitamin B12 deficiency anemia due to intrinsic factor deficiency: Secondary | ICD-10-CM | POA: Diagnosis not present

## 2024-11-17 MED ORDER — CYANOCOBALAMIN 1000 MCG/ML IJ SOLN
1000.0000 ug | Freq: Once | INTRAMUSCULAR | Status: AC
  Administered 2024-11-17: 1000 ug via INTRAMUSCULAR

## 2024-11-17 NOTE — Progress Notes (Signed)
Patient here for monthly B12 injection per Dr. Jones.  B12 1000 mcg given in left IM and patient tolerated injection well today.  

## 2024-11-17 NOTE — Patient Instructions (Signed)
 [name],  Thank you for taking the time for your Medicare Wellness Visit. I appreciate your continued commitment to your health goals. Please review the care plan we discussed, and feel free to reach out if I can assist you further.  Please note that Annual Wellness Visits do not include a physical exam. Some assessments may be limited, especially if the visit was conducted virtually. If needed, we may recommend an in-person follow-up with your provider.  Ongoing Care Seeing your primary care provider every 3 to 6 months helps us  monitor your health and provide consistent, personalized care. Next office visit on 12/18/2024.    Referrals If a referral was made during today's visit and you haven't received any updates within two weeks, please contact the referred provider directly to check on the status.  Recommended Screenings:  Health Maintenance  Topic Date Due   Zoster (Shingles) Vaccine (2 of 2) 09/21/2022   Eye exam for diabetics  12/15/2023   Hemoglobin A1C  11/03/2024   Complete foot exam   01/24/2025   Medicare Annual Wellness Visit  11/17/2025   DTaP/Tdap/Td vaccine (3 - Td or Tdap) 10/08/2027   Pneumococcal Vaccine for age over 11  Completed   Flu Shot  Completed   Meningitis B Vaccine  Aged Out   COVID-19 Vaccine  Discontinued       11/17/2024    9:38 AM  Advanced Directives  Does Patient Have a Medical Advance Directive? Yes  Type of Estate Agent of [place];Living will  Copy of Healthcare Power of Attorney in Chart? No - copy requested    Vision: Annual vision screenings are recommended for early detection of glaucoma, cataracts, and diabetic retinopathy. These exams can also reveal signs of chronic conditions such as diabetes and high blood pressure.  Dental: Annual dental screenings help detect early signs of oral cancer, gum disease, and other conditions linked to overall health, including heart disease and diabetes.  Please see the  attached documents for additional preventive care recommendations.

## 2024-11-17 NOTE — Progress Notes (Signed)
 "  Chief Complaint  Patient presents with   Medicare Wellness     Subjective:   Andre Mills is a 89 y.o. male who presents for a Medicare Annual Wellness Visit.  Visit info / Clinical Intake: Medicare Wellness Visit Type:: Subsequent Annual Wellness Visit Persons participating in visit and providing information:: patient Medicare Wellness Visit Mode:: Telephone If telephone:: video declined Since this visit was completed virtually, some vitals may be partially provided or unavailable. Missing vitals are due to the limitations of the virtual format.: Unable to obtain vitals - no equipment If Telephone or Video please confirm:: I connected with patient using audio/video enable telemedicine. I verified patient identity with two identifiers, discussed telehealth limitations, and patient agreed to proceed. Patient Location:: Home Provider Location:: Home Interpreter Needed?: No Pre-visit prep was completed: yes AWV questionnaire completed by patient prior to visit?: no Living arrangements:: lives with spouse/significant other Patient's Overall Health Status Rating: good Typical amount of pain: none Does pain affect daily life?: no Are you currently prescribed opioids?: no  Dietary Habits and Nutritional Risks How many meals a day?: 3 Eats fruit and vegetables daily?: yes Most meals are obtained by: preparing own meals Diabetic:: (!) yes Any non-healing wounds?: no How often do you check your BS?: 0 Would you like to be referred to a Nutritionist or for Diabetic Management? : no  Functional Status Activities of Daily Living (to include ambulation/medication): Independent Ambulation: Independent with device- listed below Home Assistive Devices/Equipment: [place]; Eyeglasses Medication Administration: Independent Home Management (perform basic housework or laundry): Independent Manage your own finances?: yes Primary transportation is: driving Concerns about vision?: no *vision  screening is required for WTM* Concerns about hearing?: (!) yes Uses hearing aids?: (!) yes (lost one) Hear whispered voice?: (!) no *in-person visit only*  Fall Screening Falls in the past year?: 0 Number of falls in past year: 0 Was there an injury with Fall?: 0 Fall Risk Category Calculator: 0 Patient Fall Risk Level: Low Fall Risk  Fall Risk Patient at Risk for Falls Due to: Impaired balance/gait Fall risk Follow up: Falls evaluation completed; Falls prevention discussed  Home and Transportation Safety: All rugs have non-skid backing?: N/A, no rugs All stairs or steps have railings?: N/A, no stairs Grab bars in the bathtub or shower?: (!) no Have non-skid surface in bathtub or shower?: yes Good home lighting?: yes Regular seat belt use?: yes Hospital stays in the last year:: no  Cognitive Assessment Difficulty concentrating, remembering, or making decisions? : no Will 6CIT or Mini Cog be Completed: no 6CIT or Mini Cog Declined: patient alert, oriented, able to answer questions appropriately and recall recent events  Advance Directives (For Healthcare) Does Patient Have a Medical Advance Directive?: Yes Type of Advance Directive: Healthcare Power of [place]; Living will Copy of Healthcare Power of Attorney in Chart?: No - copy requested Copy of Living Will in Chart?: No - copy requested  Reviewed/Updated  Reviewed/Updated: Reviewed All (Medical, Surgical, Family, Medications, Allergies, Care Teams, Patient Goals)    Allergies (verified) Flexeril [cyclobenzaprine], Limbitrol ds [chlordiazepoxide-amitriptyline], Macrodantin [nitrofurantoin macrocrystal], Magnesium oxide, Mobic [meloxicam], and Septra [sulfamethoxazole-trimethoprim]   Current Medications (verified) Outpatient Encounter Medications as of 11/17/2024  Medication Sig   calcium-vitamin D  (OSCAL WITH D) 500-5 MG-MCG tablet Take 1 tablet by mouth daily.   ferrous sulfate 325 (65 FE) MG tablet Take 325 mg by  mouth daily with breakfast.   gabapentin (NEURONTIN) 300 MG capsule Take 300 mg by mouth 2 (two) times daily.  Insulin  Glargine (BASAGLAR  KWIKPEN) 100 UNIT/ML Inject 20 Units into the skin daily.   Insulin  Pen Needle 32G X 6 MM MISC 1 Act by Does not apply route daily.   ketoconazole  (NIZORAL ) 2 % cream Apply 1 application topically 2 (two) times daily.   Lancets (ONETOUCH DELICA PLUS LANCET33G) MISC 1 Act by Does not apply route daily. E11.9   metFORMIN  (GLUCOPHAGE ) 500 MG tablet Take 1 tablet (500 mg total) by mouth 2 (two) times daily with a meal.   omeprazole (PRILOSEC) 20 MG capsule Take 20 mg by mouth daily.   rosuvastatin (CRESTOR) 20 MG tablet Take 20 mg by mouth daily.   sitaGLIPtin (JANUVIA) 100 MG tablet Take 100 mg by mouth daily.   tamsulosin (FLOMAX) 0.4 MG CAPS capsule Take 0.4 mg by mouth daily.   timolol (BLOCADREN) 5 MG tablet Take 5 mg by mouth 2 (two) times daily.   triamcinolone  cream (KENALOG ) 0.5 % Apply 1 Application topically 3 (three) times daily.   Facility-Administered Encounter Medications as of 11/17/2024  Medication   cyanocobalamin  ((VITAMIN B-12)) injection 1,000 mcg    History: Past Medical History:  Diagnosis Date   Anemia    Arthritis    Diabetes mellitus without complication (HCC)    GERD (gastroesophageal reflux disease)    High cholesterol    Hypertension    Wears dentures    TOP   Wears glasses    Past Surgical History:  Procedure Laterality Date   COLONOSCOPY     COLONOSCOPY W/ BIOPSIES AND POLYPECTOMY     EYE SURGERY     both cataracts   KNEE ARTHROSCOPY     left   TONSILLECTOMY     TRIGGER FINGER RELEASE Right 06/28/2014   Procedure: RELEASE A-1 PULLEY POSSIBLE EXCISION ONE SLIP SUPERFICIALIS  RIGHT MIDDLE FINGER ;  Surgeon: Arley Curia, MD;  Location: [facility];  Service: Orthopedics;  Laterality: Right;   History reviewed. No pertinent family history. Social History   Occupational History   Occupation: Retired   Tobacco Use   Smoking status: Former    Current packs/day: 0.00    Types: Cigarettes    Quit date: 06/23/1959    Years since quitting: 65.4   Smokeless tobacco: Never  Vaping Use   Vaping status: Never Used  Substance and Sexual Activity   Alcohol use: No   Drug use: No   Sexual activity: Not on file   Tobacco Counseling Counseling given: Not Answered  SDOH Screenings   Food Insecurity: No Food Insecurity (11/17/2024)  Housing: Unknown (11/17/2024)  Transportation Needs: No Transportation Needs (11/17/2024)  Utilities: Not At Risk (11/17/2024)  Alcohol Screen: Low Risk (09/20/2023)  Depression (PHQ2-9): Low Risk (11/17/2024)  Financial Resource Strain: Low Risk (09/20/2023)  Physical Activity: Insufficiently Active (11/17/2024)  Social Connections: Moderately Integrated (11/17/2024)  Stress: No Stress Concern Present (11/17/2024)  Tobacco Use: Medium Risk (11/17/2024)  Health Literacy: Adequate Health Literacy (11/17/2024)   See flowsheets for full screening details  Depression Screen PHQ 2 & 9 Depression Scale- Over the past 2 weeks, how often have you been bothered by any of the following problems? Little interest or pleasure in doing things: 0 Feeling down, depressed, or hopeless (PHQ Adolescent also includes...irritable): 0 PHQ-2 Total Score: 0 Trouble falling or staying asleep, or sleeping too much: 0 Feeling tired or having little energy: 0 Poor appetite or overeating (PHQ Adolescent also includes...weight loss): 0 Feeling bad about yourself - or that you are a failure or have let  yourself or your family down: 0 Trouble concentrating on things, such as reading the newspaper or watching television (PHQ Adolescent also includes...like school work): 0 Moving or speaking so slowly that other people could have noticed. Or the opposite - being so fidgety or restless that you have been moving around a lot more than usual: 0 Thoughts that you would be better off dead, or of hurting yourself  in some way: 0 PHQ-9 Total Score: 0 If you checked off any problems, how difficult have these problems made it for you to do your work, take care of things at home, or get along with other people?: Not difficult at all  Depression Treatment Depression Interventions/Treatment : EYV7-0 Score <4 Follow-up Not Indicated     Goals Addressed             This Visit's Progress    To maintain my current health status by continuing to eat healthy, stay physically active and socially active.   On track            Objective:    Today's Vitals   11/17/24 0917  Weight: 147 lb (66.7 kg)  Height: 5' 7 (1.702 m)   Body mass index is 23.02 kg/m.  Hearing/Vision screen Hearing Screening - Comments:: Needs hearing [facility] - Comments:: [facility]ears [facility]eglasses[facility] Immunizations and Health Maintenance Health Maintenance  Topic Date Due   Zoster Vaccines- Shingrix  (2 of 2) 09/21/2022   OPHTHALMOLOGY EXAM  12/15/2023   HEMOGLOBIN A1C  11/03/2024   FOOT EXAM  01/24/2025   Medicare Annual Wellness (AWV)  11/17/2025   DTaP/Tdap/Td (3 - Td or Tdap) 10/08/2027   Pneumococcal Vaccine: 50+ Years  Completed   Influenza Vaccine  Completed   Meningococcal B Vaccine  Aged Out   COVID-19 Vaccine  Discontinued        Assessment/Plan:  This is a routine wellness examination for Alika.  Patient Care Team: Joshua Debby CROME, MD as PCP - General (Internal Medicine) Kennyth Cy RAMAN, DO as Consulting Physician (Ophthalmology)  I have personally reviewed and noted the following in the patients chart:   Medical and social history Use of alcohol, tobacco or illicit drugs  Current medications and supplements including opioid prescriptions. Functional ability and status Nutritional status Physical activity Advanced directives List of other physicians Hospitalizations, surgeries, and ER visits in previous 12 months Vitals Screenings to include cognitive, depression,  and falls Referrals and appointments  No orders of the defined types were placed in this encounter.  In addition, I have reviewed and discussed with patient certain preventive protocols, quality metrics, and best practice recommendations. A written personalized care plan for preventive services as well as general preventive health recommendations were provided to patient.   Tia Gelb L Orion Mole, CMA   11/17/2024   Return in 1 year (on 11/17/2025).  After Visit Summary: (MyChart) Due to this being a telephonic visit, the after visit summary with patients personalized plan was offered to patient via MyChart   Nurse Notes:  Patient is due for a A1C check and can get that done during his next office visit.  He is also due for a 2nd Shingrix  vaccine.  Patient stated that he is up to date with eye exam from the [facility].  A request has been sent. "

## 2024-12-18 ENCOUNTER — Ambulatory Visit

## 2025-01-02 ENCOUNTER — Ambulatory Visit: Admitting: Internal Medicine
